# Patient Record
Sex: Male | Born: 1935 | Race: White | Hispanic: No | Marital: Married | Smoking: Former smoker
Health system: Southern US, Community
[De-identification: ages and names within clinical notes are randomized; demographics above are authoritative.]

## PROBLEM LIST (undated history)

## (undated) DIAGNOSIS — M109 Gout, unspecified: Secondary | ICD-10-CM

## (undated) DIAGNOSIS — J40 Bronchitis, not specified as acute or chronic: Secondary | ICD-10-CM

## (undated) DIAGNOSIS — N189 Chronic kidney disease, unspecified: Secondary | ICD-10-CM

## (undated) DIAGNOSIS — I4891 Unspecified atrial fibrillation: Secondary | ICD-10-CM

## (undated) DIAGNOSIS — I714 Abdominal aortic aneurysm, without rupture: Secondary | ICD-10-CM

## (undated) DIAGNOSIS — M545 Low back pain: Secondary | ICD-10-CM

## (undated) DIAGNOSIS — M1990 Unspecified osteoarthritis, unspecified site: Secondary | ICD-10-CM

## (undated) DIAGNOSIS — K219 Gastro-esophageal reflux disease without esophagitis: Secondary | ICD-10-CM

## (undated) DIAGNOSIS — K259 Gastric ulcer, unspecified as acute or chronic, without hemorrhage or perforation: Secondary | ICD-10-CM

## (undated) DIAGNOSIS — I779 Disorder of arteries and arterioles, unspecified: Secondary | ICD-10-CM

## (undated) DIAGNOSIS — N400 Enlarged prostate without lower urinary tract symptoms: Secondary | ICD-10-CM

## (undated) DIAGNOSIS — M47816 Spondylosis without myelopathy or radiculopathy, lumbar region: Secondary | ICD-10-CM

## (undated) DIAGNOSIS — I10 Essential (primary) hypertension: Secondary | ICD-10-CM

## (undated) DIAGNOSIS — Z87442 Personal history of urinary calculi: Secondary | ICD-10-CM

## (undated) DIAGNOSIS — K449 Diaphragmatic hernia without obstruction or gangrene: Secondary | ICD-10-CM

## (undated) DIAGNOSIS — I739 Peripheral vascular disease, unspecified: Secondary | ICD-10-CM

## (undated) DIAGNOSIS — L309 Dermatitis, unspecified: Secondary | ICD-10-CM

## (undated) DIAGNOSIS — C801 Malignant (primary) neoplasm, unspecified: Secondary | ICD-10-CM

## (undated) DIAGNOSIS — M48061 Spinal stenosis, lumbar region without neurogenic claudication: Secondary | ICD-10-CM

## (undated) DIAGNOSIS — G8929 Other chronic pain: Secondary | ICD-10-CM

## (undated) HISTORY — PX: CATARACT EXTRACTION, BILATERAL: SHX1313

## (undated) HISTORY — DX: Diaphragmatic hernia without obstruction or gangrene: K44.9

## (undated) HISTORY — PX: EYE SURGERY: SHX253

## (undated) HISTORY — PX: SPINE SURGERY: SHX786

## (undated) HISTORY — DX: Chronic kidney disease, unspecified: N18.9

## (undated) HISTORY — PX: PROSTATE SURGERY: SHX751

## (undated) HISTORY — DX: Abdominal aortic aneurysm, without rupture: I71.4

## (undated) HISTORY — PX: TONSILLECTOMY: SUR1361

## (undated) HISTORY — DX: Essential (primary) hypertension: I10

---
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
## 1993-02-09 DIAGNOSIS — M545 Low back pain, unspecified: Secondary | ICD-10-CM

## 1993-02-09 DIAGNOSIS — G8929 Other chronic pain: Secondary | ICD-10-CM

## 1993-02-09 HISTORY — DX: Other chronic pain: G89.29

## 1993-02-09 HISTORY — DX: Low back pain, unspecified: M54.50

## 1997-07-21 ENCOUNTER — Ambulatory Visit (HOSPITAL_COMMUNITY): Admission: RE | Admit: 1997-07-21 | Discharge: 1997-07-21 | Payer: Self-pay | Admitting: *Deleted

## 1998-11-19 ENCOUNTER — Ambulatory Visit (HOSPITAL_COMMUNITY): Admission: RE | Admit: 1998-11-19 | Discharge: 1998-11-19 | Payer: Self-pay | Admitting: *Deleted

## 1998-12-06 ENCOUNTER — Encounter: Payer: Self-pay | Admitting: Neurosurgery

## 1998-12-10 ENCOUNTER — Encounter: Payer: Self-pay | Admitting: Neurosurgery

## 1998-12-10 ENCOUNTER — Inpatient Hospital Stay (HOSPITAL_COMMUNITY): Admission: RE | Admit: 1998-12-10 | Discharge: 1998-12-14 | Payer: Self-pay | Admitting: Neurosurgery

## 1998-12-23 ENCOUNTER — Encounter: Admission: RE | Admit: 1998-12-23 | Discharge: 1998-12-23 | Payer: Self-pay | Admitting: Neurosurgery

## 1998-12-23 ENCOUNTER — Encounter: Payer: Self-pay | Admitting: Neurosurgery

## 1999-01-24 ENCOUNTER — Encounter: Admission: RE | Admit: 1999-01-24 | Discharge: 1999-01-24 | Payer: Self-pay | Admitting: Neurosurgery

## 1999-01-24 ENCOUNTER — Encounter: Payer: Self-pay | Admitting: Neurosurgery

## 1999-03-03 ENCOUNTER — Encounter: Admission: RE | Admit: 1999-03-03 | Discharge: 1999-03-03 | Payer: Self-pay | Admitting: Neurosurgery

## 1999-03-03 ENCOUNTER — Encounter: Payer: Self-pay | Admitting: Neurosurgery

## 1999-07-21 ENCOUNTER — Encounter: Admission: RE | Admit: 1999-07-21 | Discharge: 1999-07-21 | Payer: Self-pay | Admitting: Neurosurgery

## 1999-07-21 ENCOUNTER — Encounter: Payer: Self-pay | Admitting: Neurosurgery

## 1999-12-20 ENCOUNTER — Ambulatory Visit (HOSPITAL_COMMUNITY): Admission: RE | Admit: 1999-12-20 | Discharge: 1999-12-20 | Payer: Self-pay | Admitting: Neurosurgery

## 1999-12-20 ENCOUNTER — Encounter: Payer: Self-pay | Admitting: Neurosurgery

## 1999-12-29 ENCOUNTER — Encounter: Payer: Self-pay | Admitting: Neurosurgery

## 1999-12-29 ENCOUNTER — Ambulatory Visit (HOSPITAL_COMMUNITY): Admission: RE | Admit: 1999-12-29 | Discharge: 1999-12-29 | Payer: Self-pay | Admitting: Neurosurgery

## 2000-01-02 ENCOUNTER — Emergency Department (HOSPITAL_COMMUNITY): Admission: EM | Admit: 2000-01-02 | Discharge: 2000-01-02 | Payer: Self-pay

## 2000-01-02 ENCOUNTER — Encounter: Payer: Self-pay | Admitting: Emergency Medicine

## 2000-01-07 ENCOUNTER — Encounter: Payer: Self-pay | Admitting: Urology

## 2000-01-08 ENCOUNTER — Encounter: Payer: Self-pay | Admitting: Urology

## 2000-01-08 ENCOUNTER — Ambulatory Visit (HOSPITAL_COMMUNITY): Admission: RE | Admit: 2000-01-08 | Discharge: 2000-01-08 | Payer: Self-pay | Admitting: Urology

## 2000-02-24 ENCOUNTER — Encounter: Payer: Self-pay | Admitting: Neurosurgery

## 2000-02-24 ENCOUNTER — Inpatient Hospital Stay (HOSPITAL_COMMUNITY): Admission: RE | Admit: 2000-02-24 | Discharge: 2000-02-26 | Payer: Self-pay | Admitting: Neurosurgery

## 2000-06-10 ENCOUNTER — Encounter: Payer: Self-pay | Admitting: Neurosurgery

## 2000-06-10 ENCOUNTER — Ambulatory Visit (HOSPITAL_COMMUNITY): Admission: RE | Admit: 2000-06-10 | Discharge: 2000-06-10 | Payer: Self-pay | Admitting: Neurosurgery

## 2000-06-17 ENCOUNTER — Encounter: Payer: Self-pay | Admitting: Neurosurgery

## 2000-06-17 ENCOUNTER — Inpatient Hospital Stay (HOSPITAL_COMMUNITY): Admission: RE | Admit: 2000-06-17 | Discharge: 2000-06-19 | Payer: Self-pay | Admitting: Neurosurgery

## 2000-08-29 ENCOUNTER — Ambulatory Visit (HOSPITAL_COMMUNITY): Admission: RE | Admit: 2000-08-29 | Discharge: 2000-08-29 | Payer: Self-pay | Admitting: Neurosurgery

## 2002-01-23 ENCOUNTER — Encounter: Payer: Self-pay | Admitting: Neurosurgery

## 2002-01-23 ENCOUNTER — Ambulatory Visit (HOSPITAL_COMMUNITY): Admission: RE | Admit: 2002-01-23 | Discharge: 2002-01-23 | Payer: Self-pay | Admitting: Neurosurgery

## 2002-03-03 ENCOUNTER — Inpatient Hospital Stay (HOSPITAL_COMMUNITY): Admission: RE | Admit: 2002-03-03 | Discharge: 2002-03-08 | Payer: Self-pay | Admitting: Neurosurgery

## 2002-03-03 ENCOUNTER — Encounter: Payer: Self-pay | Admitting: Neurosurgery

## 2003-04-27 ENCOUNTER — Encounter: Admission: RE | Admit: 2003-04-27 | Discharge: 2003-04-27 | Payer: Self-pay | Admitting: Internal Medicine

## 2004-05-12 ENCOUNTER — Encounter: Admission: RE | Admit: 2004-05-12 | Discharge: 2004-05-12 | Payer: Self-pay | Admitting: Internal Medicine

## 2006-06-01 ENCOUNTER — Encounter: Admission: RE | Admit: 2006-06-01 | Discharge: 2006-06-01 | Payer: Self-pay | Admitting: Internal Medicine

## 2007-01-27 ENCOUNTER — Ambulatory Visit: Payer: Self-pay | Admitting: *Deleted

## 2008-01-19 ENCOUNTER — Ambulatory Visit: Payer: Self-pay | Admitting: *Deleted

## 2009-02-04 ENCOUNTER — Ambulatory Visit: Payer: Self-pay | Admitting: Surgery

## 2010-02-17 ENCOUNTER — Ambulatory Visit
Admission: RE | Admit: 2010-02-17 | Discharge: 2010-02-17 | Payer: Self-pay | Source: Home / Self Care | Attending: Surgery | Admitting: Surgery

## 2010-02-17 ENCOUNTER — Ambulatory Visit: Admit: 2010-02-17 | Payer: Self-pay | Admitting: Surgery

## 2010-06-24 NOTE — Procedures (Signed)
DUPLEX ULTRASOUND OF ABDOMINAL AORTA   INDICATION:  Followup abdominal aortic aneurysm.   HISTORY:  Diabetes:  No.  Cardiac:  No.  Hypertension:  Yes.  Smoking:  No.  Connective Tissue Disorder:  Family History:  No.  Previous Surgery:  No.   DUPLEX EXAM:         AP (cm)                   TRANSVERSE (cm)  Proximal             2.9 cm                    2.8 cm  Mid                  2.3 cm                    2.1 cm  Distal               3.1 cm                    3.2 cm  Right Iliac          Not visualized            Not visualized  Left Iliac           Not visualized            Not visualized   PREVIOUS:  Date:  01/27/2007  AP:  3.0  TRANSVERSE:  3.2   IMPRESSION:  1. Aneurysm of the distal abdominal aorta with no significant change      in the maximum diameter noted when compared to the previous exam.  2. Unable to adequately visualize the bilateral common iliac arteries      due to overlying bowel gas patterns.   ___________________________________________  P. Drucie Opitz, M.D.   CH/MEDQ  D:  01/19/2008  T:  01/19/2008  Job:  [id number]

## 2010-06-24 NOTE — Procedures (Signed)
DUPLEX ULTRASOUND OF ABDOMINAL AORTA   INDICATION:  Followup evaluation of known abdominal aortic aneurysm.   HISTORY:  Diabetes:  No.  Cardiac:  No.  Hypertension:  Yes.  Smoking:  No.  Connective Tissue Disorder:  Family History:  No.  Previous Surgery:  No.   DUPLEX EXAM:         AP (cm)                   TRANSVERSE (cm)  Proximal             2.0 cm                    1.7 cm  Mid                  3.0 cm                    3.2 cm  Distal               2.0 cm                    1.9 cm  Right Iliac          0.9 cm                    1.3 cm  Left Iliac           0.8 cm                    1.0 cm   PREVIOUS:  Date: 01/21/2006  AP:  2.9  TRANSVERSE:  3.1   IMPRESSION:  No significant change in abdominal aortic aneurysm  dimensions since previous study performed on 01/21/2006.   ___________________________________________  P. Drucie Opitz, M.D.   MC/MEDQ  D:  01/27/2007  T:  01/28/2007  Job:  [id number]

## 2010-06-24 NOTE — Procedures (Signed)
DUPLEX ULTRASOUND OF ABDOMINAL AORTA   INDICATION:  Follow up abdominal aortic aneurysm.   HISTORY:  Diabetes:  No  Cardiac:  No  Hypertension:  Yes  Smoking:  No  Family History:  No  Previous Surgery:  No   DUPLEX EXAM:         AP (cm)                   TRANSVERSE (cm)  Proximal             1.87 cm                   1.96 cm  Mid                  2.33 cm                   2.36 cm  Distal               3.04 cm                   3.27 cm  Right Iliac          1.02 cm  Left Iliac           1.05 cm   PREVIOUS:  Date: 01/19/2008  AP:  3.1  TRANSVERSE:  3.2   IMPRESSION:  Stable abdominal aortic aneurysm with largest measurement  of 3.04 cm x 3.27 cm.   ___________________________________________  V. Leia Alf, MD   AS/MEDQ  D:  02/04/2009  T:  02/05/2009  Job:  [id number]

## 2010-06-24 NOTE — Procedures (Signed)
DUPLEX ULTRASOUND OF ABDOMINAL AORTA   INDICATION:  Followup abdominal aortic aneurysm.   HISTORY:  Diabetes:  No.  Cardiac:  No.  Hypertension:  Yes.  Smoking:  No.  Connective Tissue Disorder:  Family History:  No.  Previous Surgery:   DUPLEX EXAM:         AP (cm)                   TRANSVERSE (cm)  Proximal             1.9 cm                    2.3 cm  Mid                  3.2 cm                    3.0 cm  Distal               1.8 cm                    2.3 cm  Right Iliac          1.3 cm                    1.6 cm  Left Iliac           1.2 cm                    1.6 cm   PREVIOUS:  Date:  AP:  3.0  TRANSVERSE:  3.3   IMPRESSION:  Stable appearing abdominal aortic aneurysm measuring 3.2 x  3.0 cm with intraluminal thrombus.   ___________________________________________  V. Leia Alf, MD   OD/MEDQ  D:  02/17/2010  T:  02/17/2010  Job:  [id number]

## 2010-06-24 NOTE — Assessment & Plan Note (Signed)
OFFICE VISIT   Justin [name], Justin [name]  DOB:  1923-08-27                                       02/17/2010  [id number]   The patient is a very pleasant 77 year old gentleman who has previously  seen Dr. Amedeo Plenty in the past for a small abdominal aortic aneurysm.  He is  getting regular yearly ultrasounds.  He comes in today for follow-up.  He is not having any complaints at this time and specifically no  abdominal pain.   PHYSICAL EXAMINATION:  Heart rate is 78, blood pressure 155/80, O2  saturation is 96%.  Generally, he is well-appearing, in no distress.  Cardiovascular:  Regular rate and rhythm.  No carotid bruits.  Abdomen:  Soft, nontender.  He has palpable femoral pulses.  Extremities are warm  and well-perfused.   DIAGNOSTIC STUDIES:  Ultrasound was performed today, which shows a  stable aneurysm sac measuring 3.2 x 3.0 cm.   ASSESSMENT:  Small abdominal aortic aneurysm.   PLAN:  We will continue with yearly surveillance of his aneurysm.  He  will follow up in our [facility] clinic in 1 year.     Eldridge Abrahams, MD  Electronically Signed   VWB/MEDQ  D:  02/17/2010  T:  02/18/2010  Job:  [id number]   cc:   Burnard Bunting, M.D.
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
## 2010-06-27 NOTE — H&P (Signed)
[place]. [facility]  Patient:    [name], [name]                       MRN: [id number] Adm. Date:  [id number] Attending:  Alex Gardener                         History and Physical  REASON FOR ADMISSION:  Herniated lumbar disk, L5-S1 left, with left S1 radiculopathy.  HISTORY OF PRESENT ILLNESS:  Justin [name] is a 77 year old man who is well known to me and on whom I have done two prior lumbar surgeries.  He underwent an L4-5 Ray cage fusion in October 2000 for lumbar spondylosis, spondylolithesis, spinal stenosis, and degenerative disk disease.  He did well following that surgery but subsequently developed bilateral lower extremity pain and numbness with neurogenic claudication and was found to have spinal stenosis at the L2-3 and L3-4 levels.  He underwent decompressive lumbar laminectomy in January 2002 and did well following that surgery; however, the patient then returned to my office in April complaining of more left leg pain recently, ever since he coughed and developed left leg pain.  The patient was felt to have an examination consistent with an S1 radiculopathy on the left at that point.  He noted pain in his low back and buttock, which goes down to the back of his left leg, into his calf, and into the sole of his foot.  He says that the sole of his goes numb.  He has full strength on confrontational testing but had a positive seated straight leg raise and reflex testing and symmetric knee jerks at 2+.  Ankle jerk was 2+ in the right and absent ankle jerk on the left.  It was my initial impression that the patient had an S1 radiculopathy.  He underwent an MRI of his lumbar spine and this demonstrated a small focal disk herniation at the L5-S1 level on the left compressing the S1 nerve root.  He was found to have significant spondylosis and lateral recessed stenosis at L5-S1, more marked on the left, which appears to be contributing to left S1  nerve root compression.  He says that he was miserable with pain and was very frustrated about his lack of improvement.  The area of previously surgery did not demonstrate significant left-sided structural pathology.  I gave Mr. Gillin the opportunity of pursuing epidural steroid injections or selective nerve root block or going ahead with surgery, which would consist of a hemisemilaminectomy and microdiskectomy at L5-S1 on the left.  He said he wished to go ahead with surgery as soon as possible, and it was therefore elected to proceed with surgical intervention.  This is set up for Jun 17, 2000.  I reviewed the patients studies and went over his physical examination.  I reviewed surgical models and discussed typical hospital course and operative and postoperative course and potential risks and benefits of the surgery.  The risks of surgery were discussed in detail with the patient and include but are not limited to the risks of anesthesia, blood loss, the possibility of hemorrhage, infection, damage to nerves, damage to blood vessels, injury to lumbar nerve root causing either temporary or permanent leg pain, numbness or weakness.  There is the potential for spinal fluid leak from dural tear. There is potential for post-laminectomy spondylolithesis, recurrent disk herniation quoted approximately 10%, failure to relieve pain, worsening of pain, need  for further surgery.  PAST MEDICAL HISTORY:  Hypertension, for which he takes Prinzide 12.5 mg daily, and he has a known abdominal aortic aneurysm which measures 3 cm and is followed by Dr. Amedeo Plenty.  ALLERGIES: 1. DEMEROL. 2. CODEINE.  CURRENT MEDICATIONS: 1. Prinzide daily. 2. Baby aspirin daily.  PHYSICAL EXAMINATION:  The remainder of his physical examination was unremarkable and his health is otherwise good. DD:  06/17/00 TD:  06/17/00 Job: [id number]

## 2010-06-27 NOTE — Op Note (Signed)
[place]. [facility]  Patient:    Justin [name], Justin [name]                       MRN: [id number] Proc. Date: 06/17/00 Adm. Date:  [id number] Attending:  Alex Gardener                           Operative Report  PREOPERATIVE DIAGNOSIS:  Herniated lumbar disk with foraminal and lateral recess stenosis at the left L5-S1 level, with degenerative disk disease and spondylosis and lumbar radiculopathy.  POSTOPERATIVE DIAGNOSIS:  Herniated lumbar disk with foraminal and lateral recess stenosis at the left L5-S1 level, with degenerative disk disease and spondylosis and lumbar radiculopathy.  PROCEDURE:  Left L5-S1 microdiskectomy with microdissection.  SURGEON:  Marchia Meiers. Vertell Limber, M.D.  ASSISTANT:  Ophelia Charter, M.D.  ANESTHESIA:  General endotracheal anesthesia.  ESTIMATED BLOOD LOSS:  Minimal.  COMPLICATIONS:  None.  DISPOSITION:  To recovery.  INDICATIONS:  Justin [name] is a 77 year old man with left S1 radiculopathy. He was found to have focal disk herniation along with foraminal stenosis causing left S1 nerve root compression.  It was elected to take him to surgery for decompression and foraminotomy and microdiskectomy.  DESCRIPTION OF PROCEDURE:  Justin [name] was brought to the operating room. Following the satisfactory and uncomplicated induction of general endotracheal anesthesia and placement of intravenous lines, the patient was placed in a prone position on the operating table on the Wilson frame.  Care was taken to pad soft tissue and bony prominences.  His low back was shaved, then prepped and draped in the usual sterile fashion.  The area of planned incision was infiltrated with 0.25% Marcaine and 0.5% lidocaine with 1:100,000 epinephrine. Incision was made overlying the L5-S1 interspace, carried through subcutaneous tissues to the lumbodorsal fascia, which was incised on the left side of midline.  Subperiosteal dissection was then performed,  exposing the L5-S1 interspace on the left.  Self-retaining retractor was placed.  An intraoperative x-ray confirmed correct level.  The [facility] Max drill with AM8 equivalent bur was then used to perform a hemisemilaminectomy of L5.  This was completed with Kerrison rongeur.  A foraminotomy was performed overlying the S1 nerve root as well.  There was significantly hypertrophied ligamentum flavum, which was removed in a piecemeal fashion.  There was significant hypertrophy of the medial aspect of the facet joint, which was decompressed and removed with the high-speed drill and Kerrison rongeurs.  The S1 nerve root was significantly decompressed with these maneuvers, and it was also decompressed with a foraminotomy overlying the S1 nerve root as it extended out the neural foramen.  The spinal floor was palpated with a Animator.  This was done under microscopic visualization using microdissection technique.  The nerve root was mobilized and retracted medially using a DErrico nerve root retractor.  There was a focal disk herniation, which was subligamentous, directly underlying the S1 nerve root. The epidural veins were cauterized with bipolar electrocautery.  The annulus was then incised with a 15 blade and disk material was removed in a piecemeal fashion.  The end plates were cleared of residual disk material, and medial disk material was also removed as well as disk material in the foraminal region.  Redundant annulus was cauterized with bipolar electrocautery.  There did not appear to be any residual loose disk material.  The wound was then copiously irrigated with bacitracin and saline.  Hemostasis was assured with Gelfoam soaked in thrombin.  The course of the S1 nerve root and the medial sac appeared to be well-decompressed at this point.  The microscope was taken out of the field.  The self-retaining retractor was removed.  The lumbodorsal fascia was closed with 0 Vicryl  suture, subcutaneous tissues were approximated with 2-0 Vicryl, and 3-0 Vicryl interrupted buried stitches in the skin edges. The wound was dressed with benzoin and Steri-Strips, Telfa gauze, and tape. The patient was extubated in the operating room, taken to the recovery room in stable and satisfactory condition, having tolerated this operation well. Counts were correct at the end of the case. DD:  06/17/00 TD:  06/17/00 Job: [id number]

## 2010-06-27 NOTE — Op Note (Signed)
[name], Justin                          ACCOUNT NO.:  [id number]   MEDICAL RECORD NO.:  [id number]                   PATIENT TYPE:  INP   LOCATION:  3021                                 FACILITY:  [facility]   PHYSICIAN:  Marchia Meiers. Vertell Limber, M.D.               DATE OF BIRTH:  1923-07-17   DATE OF PROCEDURE:  DATE OF DISCHARGE:                                 OPERATIVE REPORT   PREOPERATIVE DIAGNOSIS:  Recurrent herniated lumbar disk, L5-S1 with  spondylosis, degenerative disk disease and lumbar radiculopathy.   POSTOPERATIVE DIAGNOSIS:  Recurrent herniated lumbar disk, L5-S1 with  spondylosis, degenerative disk disease and lumbar radiculopathy.   PROCEDURE:  1. Re-do diskectomy, L5-S1, left.  2. Transverse lumbar interbody fusion, L5-S1.  3. Exploration of L4-5.  4. Pedicle screw fixation, L5 through S1 bilaterally.  5. Posterolateral arthrodesis, L4 through S1 laterally using morselized bone     autograft and VITOSS .   SURGEON:  Dr. Vertell Limber.   ASSISTANT:  Dr. Carloyn Manner.   ANESTHESIA:  General endotracheal anesthesia.   ESTIMATED BLOOD LOSS:  250 cc.   COMPLICATIONS:  None.   DISPOSITION:  Recovery.   INDICATIONS FOR PROCEDURE:  Justin [name] is a 77 year old man who has  undergone multiple lumbar surgeries in the past.  He underwent Ray cage at  the L4-5 level from which he did well.  He subsequently developed a  herniated disk at L5-S1 on the left and he underwent microdiskectomy, but  now has developed a foraminal recurrence of L5-S1 disk herniation on the  left.  It is elected to take him to surgery for decompression and fusion.   DESCRIPTION OF PROCEDURE:  Justin [name] is brought to the operating room.  Following satisfactory and uncomplicated induction of general endotracheal  anesthesia, and placement of intravenous lines, he was placed in the prone  position on the chest rolls, his low back was prepped and draped in the  usual sterile fashion.  The are of planned incision  was infiltrated with  0.25% Marcaine, 0.5% lidocaine with 1:200,000 epinephrine.  Incision was  made in the midline, carried through to the lumbodorsal fascia through  previously scarred in operative field.  Subperiosteal dissection was  performed to the transverse processes of L4 through the sacral ala.  Self-  retaining retractor was placed to facilitate exposure.  Intraoperative x-ray  confirmed marked at the vicinity of the L4-L5 transverse processes.  Subsequently, the fusion at L4-5 was explored and appeared to be a solid  arthrodesis.  The previously scarred in area at the left at L5-S1 was then  taken down and the pars intra-articularis was drilled out as was the lamina  and the inferior facet of L5 was removed on both the right and left sides.  The L5 nerve root was carefully dissected free of bone and soft tissue as it  extended on the left at the neuroforamen and the  lateral recess was the  decompressed under loop magnification on the left at the L5-S1 level.  There  was a large disk herniation which contained ligament which was causing  compression of both the S1 nerve root as well as the L5 nerve root as it  exited the foramen.  Using the nerve root retractor to facilitate exposure,  the L5-S1 interspace was incised with 15 blade and disk material was removed  in piecemeal fashion.  Subsequently, a laminar spreader was placed on the  right with good opening of the interspace and subsequently the Synthes 2 set  was used to do a further, more complete diskectomy at this level.  This  resulted in significant decompression of the thecal sac and the L5 and S1  nerve roots.  The end plates were stripped of any residual cartilaginous  material.  Using the intraoperative fluoroscopy to confirm positioning of  the bone grafts and pedicle screw fixation, a 9 mm Synthes allograft T-lift  spacer was placed, and accomplished appropriately.  Morselized bone  autograft was then placed over  this and counter sunk appropriately.  Pedicle  screws were then placed at L5 and S1, all screws were 5.75 mm x 40 mm, ST 90  screw system was used.  All screws had excellent purchase, positioning was  confirmed on AP and lateral fluoroscopy, there were no cut outs of the  pedicles.  Subsequently, the posterolateral bone graft was placed along with  VITOSS from the L4 transverse process, including the L5 transverse process  and the sacral ala bilaterally.  40 mm rods were then placed over the  pedicle screws, there were locked into position in situ, the nerve roots  were well decompressed.  Soft tissues were inspected and found to be in good  repair.  Prior to placing the bone graft, the wound was extensively  irrigated with Bacitracin saline.  Subsequently, the self retaining  retractor was removed, lumbodorsal fascia was closed with 1-0 Vicryl  sutures, subcutaneous tissues reapproximated with 2-0 Vicryl interrupted  inverted sutures, skin edges were reapproximated with interrupted 3-0 Vicryl  subcuticular stitch.  The wound was dressed with Benzoin, Steri-Strips,  Telfa gauze, and tape.  The patient was extubated in the operating room and  returned to the recovery room in stable, satisfactory condition, having  tolerated his operation well.                                               Marchia Meiers. Vertell Limber, M.D.    JDS/MEDQ  D:  03/03/2002  T:  03/04/2002  Job:  [id number]

## 2010-06-27 NOTE — Op Note (Signed)
Justin [name]. Justin [name]  Patient:    Justin [name], Justin [name]                       MRN: [id number] Proc. Date: 02/24/00 Adm. Date:  [id number] Attending:  Alex [name]                           Operative Report  PREOPERATIVE DIAGNOSIS:  Lumbar spinal stenosis, spondylosis and degenerative disc disease and radiculopathy L2-3 and L3-4 levels.  POSTOPERATIVE DIAGNOSIS:  Lumbar spinal stenosis, spondylosis and degenerative disc disease and radiculopathy L2-3 and L3-4 levels.  OPERATION:  Decompressive lumbar laminectomy L2, L3 and L4.  SURGEON:  Justin [name]. Justin [name], M.D.  ASSISTANT:  Justin [name]. Justin [name], M.D.  ANESTHESIA:  General endotracheal.  ESTIMATED BLOOD LOSS:  50 cc  COMPLICATIONS:  None.  DISPOSITION:  Recovery  INDICATIONS:  The patient is a 77 year old man who had previously undergone lumbar decompression and fusion at the L4-5 level with Ray cages.  He subsequently developed two years later spinal stenosis at the L2-3 and more severely at the L3-4 levels.  It was elected to take him to surgery for a decompressive lumbar laminectomy.  DESCRIPTION OF PROCEDURE:  Justin [name] was brought to the operating room. Following the satisfactory and uncomplicated induction of general endotracheal anesthesia and placement of intravenous lines, he was placed in a prone position on the Wilson frame.  His back was maintained in neutral alignment. His low back was shaved and then prepped and draped in the usual sterile fashion.  Area of planned incision was infiltrated with 0.25% Marcaine, 0.25% Lidocaine, 1:200,000 epinephrine.  Incision was made overlying the L2, L3 and L4 spinous processes, carried through lumbodorsal fascia with electrocautery. Subperiosteal dissection was performed exposing the L2-3 and L3-4 interspaces. Self retaining retractors were placed.  Towel clips were placed under the L2, L3 and L4 spinous processes and positioning of the towel clips were  confirmed on intraoperative x-ray.  The total removal of the spinous process of L3 was performed.  The cephalad half of the L4 spinous processes were removed and the inferior half of the L2 spinous process was removed.  Using the drill and AM3 bur equivalent, the laminae of L2, L3 and L4 were then thinned.  Using 3 and 4 mm Kerrison rongeurs the central spinal canal dura was decompressed.  There was marked ligamentous hypertrophy at the L3-4 interspace and to a lesser degree at the L2-3 interspace.  After decompressing the central canal, the laterally based ligament was then removed.  The decompression was carried along at the L4 level down to where there appeared to be investing scar tissue and the dura was tethered to the remaining bone.  Lateral recesses were decompressed along the L4, L3 and L2 nerve roots.  Hemostasis was assured with Gelfoam soaked in thrombin and bone wax.  The wound was copiously irrigated with Bacitracin saline.  Decompression was performed under loupe magnification.  The self retaining retractors were removed.  Deep muscle layers were reapproximated with 0 Vicryl sutures.  The lumbodorsal fascia was reapproximated with 0 Vicryl sutures.  Skin edges were reapproximated with interrupted 2-0 and 3-0 Vicryl subcuticular stitch.  The wound was dressed with Benzoin, Steri-Strips, Telfa gauze and tape.  The patient was extubated in the operating room and taken to the recovery room in stable and satisfactory condition having tolerated the operation well.  Counts were correct at the  end of the case. DD:  02/24/00 TD:  02/24/00 Job: [id number]
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
## 2011-01-19 ENCOUNTER — Encounter: Payer: Self-pay | Admitting: Surgery

## 2011-02-18 DIAGNOSIS — R7301 Impaired fasting glucose: Secondary | ICD-10-CM | POA: Diagnosis not present

## 2011-02-18 DIAGNOSIS — I739 Peripheral vascular disease, unspecified: Secondary | ICD-10-CM | POA: Diagnosis not present

## 2011-02-18 DIAGNOSIS — Z23 Encounter for immunization: Secondary | ICD-10-CM | POA: Diagnosis not present

## 2011-02-18 DIAGNOSIS — I10 Essential (primary) hypertension: Secondary | ICD-10-CM | POA: Diagnosis not present

## 2011-02-20 ENCOUNTER — Encounter: Payer: Self-pay | Admitting: Surgery

## 2011-02-23 ENCOUNTER — Ambulatory Visit (INDEPENDENT_AMBULATORY_CARE_PROVIDER_SITE_OTHER): Payer: Medicare Other | Admitting: Surgery

## 2011-02-23 ENCOUNTER — Encounter (INDEPENDENT_AMBULATORY_CARE_PROVIDER_SITE_OTHER): Payer: Medicare Other | Admitting: *Deleted

## 2011-02-23 ENCOUNTER — Encounter: Payer: Self-pay | Admitting: Surgery

## 2011-02-23 VITALS — BP 158/77 | HR 69 | Resp 16 | Ht 69.0 in | Wt 170.0 lb

## 2011-02-23 DIAGNOSIS — I714 Abdominal aortic aneurysm, without rupture, unspecified: Secondary | ICD-10-CM | POA: Insufficient documentation

## 2011-02-23 NOTE — Progress Notes (Signed)
[facility]   Patient name: Justin [name] MRN: [id number] DOB: 21-May-1923 Sex: male     Chief Complaint  Patient presents with  . AAA    one year f/up w/ Labs    HISTORY OF PRESENT ILLNESS: The patient is here for followup of his abdominal aneurysm. He denies any complaints of abdominal pain. He has had no major issues since I last saw him. He denies any neurologic symptoms he denies claudication.  Past Medical History  Diagnosis Date  . Hypertension   . Hiatal hernia   . AAA (abdominal aortic aneurysm)     History reviewed. No pertinent past surgical history.  History   Social History  . Marital Status: Married    Spouse Name: N/A    Number of Children: N/A  . Years of Education: N/A   Occupational History  . Not on file.   Social History Main Topics  . Smoking status: Former Smoker    Types: Cigarettes    Quit date: 02/10/1980  . Smokeless tobacco: Not on file  . Alcohol Use: No  . Drug Use:   . Sexually Active:    Other Topics Concern  . Not on file   Social History Narrative  . No narrative on file    Family History  Problem Relation Age of Onset  . Stroke Mother   . Heart disease Father   . Stroke Sister   . Cancer Brother     stomach    Allergies as of 02/23/2011 - Review Complete 02/23/2011  Allergen Reaction Noted  . Demerol  01/19/2011    Current Outpatient Prescriptions on File Prior to Visit  Medication Sig Dispense Refill  . aspirin EC 81 MG tablet Take 81 mg by mouth daily.        . dorzolamide-timolol (COSOPT) 22.3-6.8 MG/ML ophthalmic solution Place 1 drop into both eyes 2 (two) times daily.        Marland Kitchen LISINOPRIL PO Take by mouth daily.           REVIEW OF SYSTEMS: Cardiovascular: No chest pain, chest pressure, palpitations, orthopnea, or dyspnea on exertion. No claudication or rest pain,  No history of DVT or phlebitis. Pulmonary: No productive cough, asthma or wheezing. Neurologic: No weakness,  paresthesias, aphasia, or amaurosis. No dizziness. Hematologic: No bleeding problems or clotting disorders. Musculoskeletal: No joint pain or joint swelling. Gastrointestinal: No blood in stool or hematemesis Genitourinary: No dysuria or hematuria. Psychiatric:: No history of major depression. Integumentary: No rashes or ulcers. Constitutional: No fever or chills.  PHYSICAL EXAMINATION:   Vital signs are BP 158/77  Pulse 69  Resp 16  Ht 5\' 9"  (1.753 m)  Wt 170 lb (77.111 kg)  BMI 25.10 kg/m2  SpO2 97% General: The patient appears their stated age. HEENT:  No gross abnormalities Pulmonary:  Non labored breathing Abdomen: Soft and non-tender Musculoskeletal: There are no major deformities. Neurologic: No focal weakness or paresthesias are detected, Skin: There are no ulcer or rashes noted. Psychiatric: The patient has normal affect. Cardiovascular: There is a regular rate and rhythm without significant murmur appreciated. No carotid bruits   Diagnostic Studies Ultrasound has been reviewed today his aneurysm measures 3.8 cm this is an increase from last year where it was 3.2.  Assessment: Abdominal aortic aneurysm Plan: While the patient has had a rather significant increase in size his aneurysm over the past year (6 mm) I do not believe he needs criteria to have this addressed.  I am having him come back in one year for repeat ultrasound certainly if he is continuing to grow at this rate he may require intervention sooner rather than later. Again he will come back in one year with a repeat ultrasound.  Eldridge Abrahams, M.D. [facility] [place] Office: [phone] Pager:  [phone]

## 2011-02-25 ENCOUNTER — Other Ambulatory Visit: Payer: Self-pay | Admitting: Neurosurgery

## 2011-02-25 DIAGNOSIS — IMO0002 Reserved for concepts with insufficient information to code with codable children: Secondary | ICD-10-CM | POA: Diagnosis not present

## 2011-02-25 DIAGNOSIS — M545 Low back pain: Secondary | ICD-10-CM | POA: Diagnosis not present

## 2011-02-25 DIAGNOSIS — M47817 Spondylosis without myelopathy or radiculopathy, lumbosacral region: Secondary | ICD-10-CM

## 2011-03-02 NOTE — Procedures (Unsigned)
DUPLEX ULTRASOUND OF ABDOMINAL AORTA  INDICATION:  Followup AAA  HISTORY: Diabetes:  No Cardiac:  No Hypertension:  Yes Smoking:  No Connective Tissue Disorder: Family History:  No Previous Surgery:  No  DUPLEX EXAM:         AP (cm)                   TRANSVERSE (cm) Proximal             2.38 cm                   cm Mid                  3.82 cm                   3.86 cm Distal               2.06 cm                   cm Right Iliac          1.08 cm                   cm Left Iliac           1.01 cm                   cm  PREVIOUS:  Date:  02/17/2010  AP:  3.2  TRANSVERSE:  3.0  IMPRESSION: 1. Evidence of abdominal aortic aneurysm measuring 3.82 x 3.86 cm on     today's exam which represents an increase in size compared to     previous exam. 2. There is mild to moderate diffuse plaque of the proximal aorta with     nonhemodynamically significant calcific disease at the neck of the     aneurysm which may have prevented accurate measurements.  ___________________________________________ V. Leia Alf, MD  LT/MEDQ  D:  02/24/2011  T:  02/24/2011  Job:  [id number]

## 2011-03-04 ENCOUNTER — Ambulatory Visit
Admission: RE | Admit: 2011-03-04 | Discharge: 2011-03-04 | Disposition: A | Discharge status: Home / Self Care | Payer: Medicare Other | Source: Ambulatory Visit | Attending: Neurosurgery | Admitting: Neurosurgery

## 2011-03-04 DIAGNOSIS — Z981 Arthrodesis status: Secondary | ICD-10-CM | POA: Diagnosis not present

## 2011-03-04 DIAGNOSIS — M47817 Spondylosis without myelopathy or radiculopathy, lumbosacral region: Secondary | ICD-10-CM

## 2011-03-04 DIAGNOSIS — M5126 Other intervertebral disc displacement, lumbar region: Secondary | ICD-10-CM | POA: Diagnosis not present

## 2011-03-04 MED ORDER — GADOBENATE DIMEGLUMINE 529 MG/ML IV SOLN
15.0000 mL | Freq: Once | INTRAVENOUS | Status: AC | PRN
  Administered 2011-03-04: 15 mL via INTRAVENOUS

## 2011-03-11 DIAGNOSIS — M545 Low back pain: Secondary | ICD-10-CM | POA: Diagnosis not present

## 2011-03-16 DIAGNOSIS — E785 Hyperlipidemia, unspecified: Secondary | ICD-10-CM | POA: Diagnosis not present

## 2011-03-16 DIAGNOSIS — I739 Peripheral vascular disease, unspecified: Secondary | ICD-10-CM | POA: Diagnosis not present

## 2011-03-16 DIAGNOSIS — I10 Essential (primary) hypertension: Secondary | ICD-10-CM | POA: Diagnosis not present

## 2011-03-16 DIAGNOSIS — E739 Lactose intolerance, unspecified: Secondary | ICD-10-CM | POA: Diagnosis not present

## 2011-04-02 ENCOUNTER — Other Ambulatory Visit: Payer: Self-pay | Admitting: Neurosurgery

## 2011-04-07 ENCOUNTER — Encounter (HOSPITAL_COMMUNITY): Payer: Self-pay | Admitting: Pharmacy Technician

## 2011-04-07 DIAGNOSIS — H538 Other visual disturbances: Secondary | ICD-10-CM | POA: Diagnosis not present

## 2011-04-09 NOTE — Pre-Procedure Instructions (Signed)
[place]  04/09/2011   Your procedure is scheduled on:  Tuesday April 21, 2011 at 0730 am  Report to [facility] at [facility].  Call this number if you have problems the morning of surgery: [phone]   Remember:   Do not eat food:After Midnight.  May have clear liquids: up to 4 Hours before arrival until 0130 am.  Clear liquids include soda, tea, black coffee, apple or grape juice, broth.  Take these medicines the morning of surgery with A SIP OF WATER: may take Timolol eye drop   Do not wear jewelry, make-up or nail polish.  Do not wear lotions, powders, or perfumes. You may wear deodorant.  Do not shave 48 hours prior to surgery.  Do not bring valuables to the hospital.  Contacts, dentures or bridgework may not be worn into surgery.  Leave suitcase in the car. After surgery it may be brought to your room.  For patients admitted to the hospital, checkout time is 11:00 AM the day of discharge.   Patients discharged the day of surgery will not be allowed to drive home.  Name and phone number of your driver:   Special Instructions: CHG Shower Use Special Wash: 1/2 bottle night before surgery and 1/2 bottle morning of surgery.   Please read over the following fact sheets that you were given: Pain Booklet, Coughing and Deep Breathing, Blood Transfusion Information and Surgical Site Infection Prevention

## 2011-04-10 ENCOUNTER — Encounter (HOSPITAL_COMMUNITY)
Admission: RE | Admit: 2011-04-10 | Discharge: 2011-04-10 | Disposition: A | Discharge status: Home / Self Care | Payer: Medicare Other | Source: Ambulatory Visit | Attending: Anesthesiology | Admitting: Anesthesiology

## 2011-04-10 ENCOUNTER — Encounter (HOSPITAL_COMMUNITY)
Admission: RE | Admit: 2011-04-10 | Discharge: 2011-04-10 | Disposition: A | Discharge status: Home / Self Care | Payer: Medicare Other | Source: Ambulatory Visit | Attending: Neurosurgery | Admitting: Neurosurgery

## 2011-04-10 ENCOUNTER — Encounter (HOSPITAL_COMMUNITY): Payer: Self-pay

## 2011-04-10 DIAGNOSIS — M47817 Spondylosis without myelopathy or radiculopathy, lumbosacral region: Secondary | ICD-10-CM | POA: Diagnosis not present

## 2011-04-10 HISTORY — DX: Unspecified osteoarthritis, unspecified site: M19.90

## 2011-04-10 HISTORY — DX: Bronchitis, not specified as acute or chronic: J40

## 2011-04-10 LAB — BASIC METABOLIC PANEL
GFR calc Af Amer: 68 mL/min — ABNORMAL LOW (ref >90)
GFR calc non Af Amer: 59 mL/min — ABNORMAL LOW (ref >90)
Glucose, Bld: 95 mg/dL (ref 70–99)
Potassium: 3.9 mEq/L (ref 3.5–5.1)
Sodium: 139 mEq/L (ref 135–145)

## 2011-04-10 LAB — CBC
Hemoglobin: 14.9 g/dL (ref 13.0–17.0)
RBC: 4.69 MIL/uL (ref 4.22–5.81)

## 2011-04-10 LAB — TYPE AND SCREEN
ABO/RH(D): O POS
Antibody Screen: NEGATIVE

## 2011-04-10 LAB — ABO/RH: ABO/RH(D): O POS

## 2011-04-10 NOTE — Progress Notes (Signed)
EKG performed at Dr. Jacquiline Doe office in December 2012. Office faxed and requested information. Confirmation placed in chart.

## 2011-04-13 NOTE — Progress Notes (Signed)
Received ekg from Dr.Aronson's office, anesthesia please review.

## 2011-04-13 NOTE — Progress Notes (Signed)
Requested ekg from Dr. Jacquiline Doe office, left message for medical records.

## 2011-04-14 NOTE — Consult Note (Signed)
Anesthesia:  Patient is a 78 year old male scheduled for a right L3-4 decompression with fusion on 04/21/11.  History includes HTN, HH, arthritis, bronchitis, former smoker, 3.8 cm AAA (ultrasound 02/23/11), prior back and prostate surgery.  PCP is Dr. Reynaldo Minium at [facility] who saw him pre-operatively.  Per his note patient is "active and functions more like a 78 year old."  Labs noted.  BUN 24, Cr 1.1.  CBC WNL.  CXR from 04/10/11 showed no acute disease.  EKG from Dr. Jacquiline Doe office on 08/18/10 showed SR with first degree AVB, possible anterior infarct (age undetermined). I don't think there is significant change since 2001.  He did not report any CV symptoms during PAT or to Dr. Reynaldo Minium at his last on April 10, 2011.  Anticipate he can proceed if not significant change in his status.

## 2011-04-20 MED ORDER — CEFAZOLIN SODIUM-DEXTROSE 2-3 GM-% IV SOLR
2.0000 g | INTRAVENOUS | Status: AC
  Administered 2011-04-21: 2 g via INTRAVENOUS
  Filled 2011-04-20: qty 50

## 2011-04-20 NOTE — H&P (Signed)
Justin [name]  [id number]  DOB:  04/23/1923  03/11/2011:          Justin [name] returns today to review his MRI which shows that he has significant spondylosis at L3-4 eccentric to the right consistent with his plain radiographs with some mild anterolisthesis of L1 on L2 and L2 on L3.  We talked about treatment options.  He is anxious to get some relief.  While he is 56, he is extremely active, still maintains rental properties and is in good health.  He does have a small abdominal aortic aneurysm which he has already been evaluated for and which his vascular surgeon said was unchanged and did not require intervention.    I have recommended, based on the severity of his pain and significant imaging findings, that he undergo anterolateral decompression and fusion L3-4 level with lateral plating.  He is not a candidate for spinous process plate as he has previously had laminectomy at this level.  He wishes to go ahead with surgery, but is going to discuss it with his family and decide whether he wishes to proceed and he will call to set this up.  We went over patient education information and gave him literature on the procedure and described it in detail, including hospitalization, and he was fitted for a back brace.                                                    Marchia Meiers. Vertell Limber, M.D./sv  NEUROSURGICAL CONSULTATION  Justin [name]  [id number]  DOB:  05-30-1923                                       February 25, 2011   HISTORY OF PRESENT ILLNESS:            Justin [name] comes back today.  I have not seen him since 2007 and, at this point, he comes back complaining of back pain.  He is currently 78 years old.  He notes right-sided low back pain and left lateral foot numbness which he dates to 2004.  He had lumbar fusion by me at L5-S1 in 2004, L5 through S1 disc surgery in 2002, L2 through 4 laminectomy in 2002 and L4-5 diskectomy in 2000.  He has a history of hypertension, ulcer diagnosed in 1977, triple A which  was 3 cm. in 2000 and stable as of Monday 02/23/2011.  He notes the last 6 to 8 weeks he laid an inlay floor and developed pain after that.  He says that he has mid low back pain radiating to the right.  It is not really going to his legs.  He does have some numbness into his left foot.  He walks with a flexed attitude and when he leans back, he describes pain in the region of the L3-4 level.    REVIEW OF SYSTEMS:                    Review of Systems was reviewed with the patient.  Pertinent positives include wears glasses, glaucoma, cataracts, high blood pressure, back pain, neck pain, skin cancer.    PAST MEDICAL HISTORY:  Current Medical Conditions:  As previously described.      Prior Operations and Hospitalizations:  As previously described.      Medications and Allergies:  HE IS ALLERGIC TO DEMEROL.  Current medications include Avodart 0.5 mg twice weekly, Lisinopril/HCTZ 20/12.5 mg qd, Aspirin 81 mg qd, Cosopt one drop in each eye bid.      Height and Weight:  He is currently 5', 9 " tall and 169 lbs.    SOCIAL HISTORY:                           He is a nonsmoker, nondrinker and no history of substance abuse.    DIAGNOSTIC STUDIES:                  We obtained plain radiographs in the office today which show solid previous arthrodesis at the L4 through S1 levels with severe degenerative disc disease and vacuum disc phenomenon at the L3-4 level.    PHYSICAL EXAMINATION:                  General Appearance:  Justin [name] is a pleasant, cooperative man in no acute distress.      Blood Pressure, Pulse and Respiratory Rate:  His blood pressure is 150/90.  Heart rate is 74 and regular.  Respiratory rate is 16.        HEENT - normocephalic, atraumatic.  The pupils are equal, round and reactive to light.  The extraocular muscles are intact.  Sclerae - white.  Conjunctiva - pink.  Oropharynx benign.  Uvula midline.     Neck - there are no masses, meningismus, deformities,  tracheal deviation, jugular vein distention or carotid bruits.  There is normal cervical range of motion.  Spurlings' test is negative without reproducible radicular pain turning the patient's head to either side.  Lhermitte's sign is not present with axial compression.      Respiratory - there is normal respiratory effort with good intercostal function.  Lungs are clear to auscultation.  There are no rales, rhonchi or wheezes.      Cardiovascular - the heart has regular rate and rhythm to auscultation.  No murmurs are appreciated.  There is no extremity edema, cyanosis or clubbing.  There are palpable pedal pulses.      Abdomen - soft, nontender, no hepatosplenomegaly appreciated or masses.  There are active bowel sounds.  No guarding or rebound.      Musculoskeletal Examination - he complains of pain in the region of the L3-4 level eccentric to the right in the paravertebral musculature.  He has limitations in forward bending and extension.  Straight leg raise is positive for back pain.    NEUROLOGICAL EXAMINATION:     The patient is oriented to time, person and place and has good recall of both recent and remote memory with normal attention span and concentration.  The patient speaks with clear and fluent speech and exhibits normal language function and appropriate fund of knowledge.      Cranial Nerve Examination - pupils are equal, round and reactive to light.  Extraocular movements are full.  Visual fields are full to confrontational testing.  Facial sensation and facial movement are symmetric and intact.  Hearing is intact to finger rub.  Palate is upgoing.  Shoulder shrug is symmetric.  Tongue protrudes in the midline.      Motor Examination - motor strength is 5/5 in the bilateral deltoids,  biceps, triceps, handgrips, wrist extensors, interosseous.  In the lower extremities motor strength is 5/5 in hip flexion, extension, quadriceps, hamstrings, plantar flexion, dorsiflexion and extensor  hallucis longus.      Sensory Examination - he does note some numbness to pin in the lateral aspect of his left foot.      Deep Tendon Reflexes - 2 in the biceps, triceps and brachioradialis, 2 at the knees, 2 at the ankles and great toes are downgoing to plantar stimulation.      Cerebellar Examination - normal coordination in upper and lower extremities and normal rapid alternating movements.  Romberg test is negative.    IMPRESSION AND RECOMMENDATIONS:         At this point, I have suggested to Justin [name] that we go ahead with an MRI of his lumbar spine to better clarify if he has significant nerve root compression and the severity of the structural pathology at the L3-4 level.  He wants to go ahead and do this.  I will see him back after that has been done and make further recommendations at that point.    [place] Vertell Limber, M.D.  JDS:sv

## 2011-04-21 ENCOUNTER — Inpatient Hospital Stay (HOSPITAL_COMMUNITY): Payer: Medicare Other | Admitting: Vascular Surgery

## 2011-04-21 ENCOUNTER — Encounter (HOSPITAL_COMMUNITY)
Admission: RE | Disposition: A | Discharge status: Home / Self Care | Payer: Self-pay | Source: Ambulatory Visit | Attending: Neurosurgery

## 2011-04-21 ENCOUNTER — Encounter (HOSPITAL_COMMUNITY): Payer: Self-pay | Admitting: Vascular Surgery

## 2011-04-21 ENCOUNTER — Inpatient Hospital Stay (HOSPITAL_COMMUNITY)
Admission: RE | Admit: 2011-04-21 | Discharge: 2011-04-26 | DRG: 460 | Disposition: A | Discharge status: Home / Self Care | Payer: Medicare Other | Source: Ambulatory Visit | Attending: Neurosurgery | Admitting: Neurosurgery

## 2011-04-21 ENCOUNTER — Encounter (HOSPITAL_COMMUNITY): Payer: Self-pay | Admitting: *Deleted

## 2011-04-21 ENCOUNTER — Inpatient Hospital Stay (HOSPITAL_COMMUNITY): Payer: Medicare Other

## 2011-04-21 ENCOUNTER — Encounter (HOSPITAL_COMMUNITY): Payer: Self-pay | Admitting: General Practice

## 2011-04-21 DIAGNOSIS — M47817 Spondylosis without myelopathy or radiculopathy, lumbosacral region: Secondary | ICD-10-CM | POA: Diagnosis not present

## 2011-04-21 DIAGNOSIS — I10 Essential (primary) hypertension: Secondary | ICD-10-CM | POA: Diagnosis present

## 2011-04-21 DIAGNOSIS — I4891 Unspecified atrial fibrillation: Secondary | ICD-10-CM | POA: Diagnosis not present

## 2011-04-21 DIAGNOSIS — M4000 Postural kyphosis, site unspecified: Secondary | ICD-10-CM | POA: Diagnosis present

## 2011-04-21 DIAGNOSIS — Z981 Arthrodesis status: Secondary | ICD-10-CM | POA: Diagnosis not present

## 2011-04-21 DIAGNOSIS — M4310 Spondylolisthesis, site unspecified: Secondary | ICD-10-CM | POA: Diagnosis not present

## 2011-04-21 DIAGNOSIS — Z87891 Personal history of nicotine dependence: Secondary | ICD-10-CM | POA: Diagnosis not present

## 2011-04-21 DIAGNOSIS — M545 Low back pain: Secondary | ICD-10-CM | POA: Diagnosis not present

## 2011-04-21 DIAGNOSIS — I369 Nonrheumatic tricuspid valve disorder, unspecified: Secondary | ICD-10-CM | POA: Diagnosis not present

## 2011-04-21 HISTORY — DX: Unspecified atrial fibrillation: I48.91

## 2011-04-21 HISTORY — PX: BACK SURGERY: SHX140

## 2011-04-21 HISTORY — DX: Gastric ulcer, unspecified as acute or chronic, without hemorrhage or perforation: K25.9

## 2011-04-21 HISTORY — PX: ANTERIOR LAT LUMBAR FUSION: SHX1168

## 2011-04-21 HISTORY — DX: Other chronic pain: G89.29

## 2011-04-21 HISTORY — DX: Spinal stenosis, lumbar region without neurogenic claudication: M48.061

## 2011-04-21 HISTORY — DX: Spondylosis without myelopathy or radiculopathy, lumbar region: M47.816

## 2011-04-21 HISTORY — DX: Low back pain: M54.5

## 2011-04-21 HISTORY — DX: Gout, unspecified: M10.9

## 2011-04-21 HISTORY — DX: Gastro-esophageal reflux disease without esophagitis: K21.9

## 2011-04-21 SURGERY — ANTERIOR LATERAL LUMBAR FUSION 1 LEVEL
Anesthesia: General | Laterality: Right

## 2011-04-21 MED ORDER — FENTANYL CITRATE 0.05 MG/ML IJ SOLN
INTRAMUSCULAR | Status: DC | PRN
  Administered 2011-04-21: 25 ug via INTRAVENOUS
  Administered 2011-04-21: 150 ug via INTRAVENOUS

## 2011-04-21 MED ORDER — LISINOPRIL-HYDROCHLOROTHIAZIDE 20-12.5 MG PO TABS: 1.0000 | ORAL_TABLET | Freq: Every day | ORAL | Status: DC

## 2011-04-21 MED ORDER — SODIUM CHLORIDE 0.9 % IV SOLN
INTRAVENOUS | Status: AC
  Filled 2011-04-21: qty 500

## 2011-04-21 MED ORDER — DIPHENHYDRAMINE HCL 12.5 MG/5ML PO ELIX
12.5000 mg | ORAL_SOLUTION | Freq: Four times a day (QID) | ORAL | Status: DC | PRN
  Filled 2011-04-21: qty 5

## 2011-04-21 MED ORDER — MORPHINE SULFATE (PF) 1 MG/ML IV SOLN
INTRAVENOUS | Status: DC
  Administered 2011-04-21: 12 mg via INTRAVENOUS
  Administered 2011-04-22: 04:00:00 via INTRAVENOUS
  Administered 2011-04-22: 7.5 mg via INTRAVENOUS

## 2011-04-21 MED ORDER — 0.9 % SODIUM CHLORIDE (POUR BTL) OPTIME
TOPICAL | Status: DC | PRN
  Administered 2011-04-21: 1000 mL

## 2011-04-21 MED ORDER — KCL IN DEXTROSE-NACL 20-5-0.45 MEQ/L-%-% IV SOLN
INTRAVENOUS | Status: DC
  Administered 2011-04-21 (×2): via INTRAVENOUS
  Filled 2011-04-21 (×10): qty 1000

## 2011-04-21 MED ORDER — PHENYLEPHRINE HCL 10 MG/ML IJ SOLN
INTRAMUSCULAR | Status: DC | PRN
  Administered 2011-04-21: 40 ug via INTRAVENOUS
  Administered 2011-04-21 (×2): 80 ug via INTRAVENOUS
  Administered 2011-04-21: 40 ug via INTRAVENOUS
  Administered 2011-04-21 (×3): 80 ug via INTRAVENOUS

## 2011-04-21 MED ORDER — METHOCARBAMOL 500 MG PO TABS
500.0000 mg | ORAL_TABLET | Freq: Four times a day (QID) | ORAL | Status: DC | PRN
  Administered 2011-04-22: 500 mg via ORAL
  Filled 2011-04-21 (×2): qty 1

## 2011-04-21 MED ORDER — HYDROMORPHONE HCL PF 1 MG/ML IJ SOLN
INTRAMUSCULAR | Status: AC
  Filled 2011-04-21: qty 1

## 2011-04-21 MED ORDER — LIDOCAINE HCL (CARDIAC) 20 MG/ML IV SOLN
INTRAVENOUS | Status: DC | PRN
  Administered 2011-04-21: 50 mg via INTRAVENOUS

## 2011-04-21 MED ORDER — KCL IN DEXTROSE-NACL 20-5-0.45 MEQ/L-%-% IV SOLN
INTRAVENOUS | Status: AC
  Filled 2011-04-21: qty 1000

## 2011-04-21 MED ORDER — MORPHINE SULFATE (PF) 1 MG/ML IV SOLN
INTRAVENOUS | Status: AC
  Administered 2011-04-21: 13:00:00
  Filled 2011-04-21: qty 25

## 2011-04-21 MED ORDER — METHOCARBAMOL 100 MG/ML IJ SOLN: 500.0000 mg | Freq: Four times a day (QID) | INTRAVENOUS | Status: DC | PRN

## 2011-04-21 MED ORDER — SODIUM CHLORIDE 0.9 % IJ SOLN: 9.0000 mL | INTRAMUSCULAR | Status: DC | PRN

## 2011-04-21 MED ORDER — CEFAZOLIN SODIUM 1-5 GM-% IV SOLN
1.0000 g | Freq: Three times a day (TID) | INTRAVENOUS | Status: AC
  Administered 2011-04-21 (×2): 1 g via INTRAVENOUS
  Filled 2011-04-21 (×2): qty 50

## 2011-04-21 MED ORDER — OXYCODONE-ACETAMINOPHEN 5-325 MG PO TABS: 1.0000 | ORAL_TABLET | ORAL | Status: DC | PRN

## 2011-04-21 MED ORDER — ONDANSETRON HCL 4 MG/2ML IJ SOLN: 4.0000 mg | Freq: Once | INTRAMUSCULAR | Status: DC | PRN

## 2011-04-21 MED ORDER — DORZOLAMIDE HCL-TIMOLOL MAL 2-0.5 % OP SOLN
1.0000 [drp] | Freq: Two times a day (BID) | OPHTHALMIC | Status: DC
  Administered 2011-04-21 – 2011-04-26 (×11): 1 [drp] via OPHTHALMIC
  Filled 2011-04-21: qty 10

## 2011-04-21 MED ORDER — ONDANSETRON HCL 4 MG/2ML IJ SOLN
4.0000 mg | Freq: Four times a day (QID) | INTRAMUSCULAR | Status: DC | PRN
  Filled 2011-04-21: qty 2

## 2011-04-21 MED ORDER — ONDANSETRON HCL 4 MG/2ML IJ SOLN
4.0000 mg | INTRAMUSCULAR | Status: DC | PRN
  Administered 2011-04-22: 4 mg via INTRAVENOUS
  Filled 2011-04-21: qty 2

## 2011-04-21 MED ORDER — SUCCINYLCHOLINE CHLORIDE 20 MG/ML IJ SOLN
INTRAMUSCULAR | Status: DC | PRN
  Administered 2011-04-21: 80 mg via INTRAVENOUS

## 2011-04-21 MED ORDER — ACETAMINOPHEN 325 MG PO TABS: 650.0000 mg | ORAL_TABLET | ORAL | Status: DC | PRN

## 2011-04-21 MED ORDER — HYDROCODONE-ACETAMINOPHEN 5-325 MG PO TABS: 1.0000 | ORAL_TABLET | ORAL | Status: DC | PRN

## 2011-04-21 MED ORDER — DIPHENHYDRAMINE HCL 50 MG/ML IJ SOLN: 12.5000 mg | Freq: Four times a day (QID) | INTRAMUSCULAR | Status: DC | PRN

## 2011-04-21 MED ORDER — SODIUM CHLORIDE 0.9 % IR SOLN
Status: DC | PRN
  Administered 2011-04-21: 07:00:00

## 2011-04-21 MED ORDER — SODIUM CHLORIDE 0.9 % IJ SOLN: 3.0000 mL | INTRAMUSCULAR | Status: DC | PRN

## 2011-04-21 MED ORDER — PHENOL 1.4 % MT LIQD: 1.0000 | OROMUCOSAL | Status: DC | PRN

## 2011-04-21 MED ORDER — LISINOPRIL 20 MG PO TABS
20.0000 mg | ORAL_TABLET | Freq: Every day | ORAL | Status: DC
  Administered 2011-04-21 – 2011-04-26 (×6): 20 mg via ORAL
  Filled 2011-04-21 (×6): qty 1

## 2011-04-21 MED ORDER — ZOLPIDEM TARTRATE 5 MG PO TABS: 5.0000 mg | ORAL_TABLET | Freq: Every evening | ORAL | Status: DC | PRN

## 2011-04-21 MED ORDER — DOCUSATE SODIUM 100 MG PO CAPS
100.0000 mg | ORAL_CAPSULE | Freq: Two times a day (BID) | ORAL | Status: DC
  Administered 2011-04-21 – 2011-04-26 (×10): 100 mg via ORAL
  Filled 2011-04-21 (×10): qty 1

## 2011-04-21 MED ORDER — MENTHOL 3 MG MT LOZG: 1.0000 | LOZENGE | OROMUCOSAL | Status: DC | PRN

## 2011-04-21 MED ORDER — SODIUM CHLORIDE 0.9 % IV SOLN
250.0000 mL | INTRAVENOUS | Status: DC
  Administered 2011-04-22: 250 mL via INTRAVENOUS

## 2011-04-21 MED ORDER — LACTATED RINGERS IV SOLN
INTRAVENOUS | Status: DC | PRN
  Administered 2011-04-21 (×2): via INTRAVENOUS

## 2011-04-21 MED ORDER — NALOXONE HCL 0.4 MG/ML IJ SOLN: 0.4000 mg | INTRAMUSCULAR | Status: DC | PRN

## 2011-04-21 MED ORDER — EPHEDRINE SULFATE 50 MG/ML IJ SOLN
INTRAMUSCULAR | Status: DC | PRN
  Administered 2011-04-21 (×2): 10 mg via INTRAVENOUS
  Administered 2011-04-21: 5 mg via INTRAVENOUS
  Administered 2011-04-21: 10 mg via INTRAVENOUS
  Administered 2011-04-21: 5 mg via INTRAVENOUS

## 2011-04-21 MED ORDER — LIDOCAINE-EPINEPHRINE 1 %-1:100000 IJ SOLN
INTRAMUSCULAR | Status: DC | PRN
  Administered 2011-04-21: 5 mL

## 2011-04-21 MED ORDER — HYDROCHLOROTHIAZIDE 12.5 MG PO CAPS
12.5000 mg | ORAL_CAPSULE | Freq: Every day | ORAL | Status: DC
  Administered 2011-04-21 – 2011-04-26 (×6): 12.5 mg via ORAL
  Filled 2011-04-21 (×6): qty 1

## 2011-04-21 MED ORDER — ONDANSETRON HCL 4 MG/2ML IJ SOLN
INTRAMUSCULAR | Status: DC | PRN
  Administered 2011-04-21: 4 mg via INTRAVENOUS

## 2011-04-21 MED ORDER — ACETAMINOPHEN 650 MG RE SUPP: 650.0000 mg | RECTAL | Status: DC | PRN

## 2011-04-21 MED ORDER — MORPHINE SULFATE (PF) 1 MG/ML IV SOLN
INTRAVENOUS | Status: AC
  Filled 2011-04-21: qty 25

## 2011-04-21 MED ORDER — BUPIVACAINE HCL (PF) 0.25 % IJ SOLN
INTRAMUSCULAR | Status: DC | PRN
  Administered 2011-04-21: 5 mL

## 2011-04-21 MED ORDER — PROPOFOL 10 MG/ML IV EMUL
INTRAVENOUS | Status: DC | PRN
  Administered 2011-04-21: 150 mg via INTRAVENOUS

## 2011-04-21 MED ORDER — HYDROMORPHONE HCL PF 1 MG/ML IJ SOLN
0.2500 mg | INTRAMUSCULAR | Status: DC | PRN
  Administered 2011-04-21 (×2): 0.5 mg via INTRAVENOUS

## 2011-04-21 MED ORDER — SODIUM CHLORIDE 0.9 % IJ SOLN
3.0000 mL | Freq: Two times a day (BID) | INTRAMUSCULAR | Status: DC
  Administered 2011-04-21 – 2011-04-26 (×9): 3 mL via INTRAVENOUS

## 2011-04-21 MED ORDER — BACITRACIN 50000 UNITS IM SOLR
INTRAMUSCULAR | Status: AC
  Filled 2011-04-21: qty 1

## 2011-04-21 SURGICAL SUPPLY — 64 items
ADH SKN CLS APL DERMABOND .7 (GAUZE/BANDAGES/DRESSINGS) ×1
BAG DECANTER FOR FLEXI CONT (MISCELLANEOUS) ×2 IMPLANT
BLADE SURG ROTATE 9660 (MISCELLANEOUS) IMPLANT
BOLT 5.5X55MM (Bolt) ×2 IMPLANT
BOLT 5.5X60MM (Bolt) ×1 IMPLANT
BONE VOID FILLER STRIP 10CC (Bone Implant) ×1 IMPLANT
CLOTH BEACON ORANGE TIMEOUT ST (SAFETY) ×2 IMPLANT
CONT SPEC 4OZ CLIKSEAL STRL BL (MISCELLANEOUS) IMPLANT
CORENT WIDE 10X22X55 (Orthopedic Implant) ×2 IMPLANT
COROENT WIDE 10X22X55 (Orthopedic Implant) IMPLANT
COVER BACK TABLE 24X17X13 BIG (DRAPES) IMPLANT
DERMABOND ADVANCED (GAUZE/BANDAGES/DRESSINGS) ×1
DERMABOND ADVANCED .7 DNX12 (GAUZE/BANDAGES/DRESSINGS) ×2 IMPLANT
DRAPE C-ARM 42X72 X-RAY (DRAPES) ×2 IMPLANT
DRAPE C-ARMOR (DRAPES) ×2 IMPLANT
DRAPE LAPAROTOMY 100X72X124 (DRAPES) ×2 IMPLANT
DRAPE POUCH INSTRU U-SHP 10X18 (DRAPES) ×2 IMPLANT
DURAPREP 26ML APPLICATOR (WOUND CARE) ×2 IMPLANT
ELECT REM PT RETURN 9FT ADLT (ELECTROSURGICAL) ×2
ELECTRODE REM PT RTRN 9FT ADLT (ELECTROSURGICAL) ×1 IMPLANT
GAUZE SPONGE 4X4 16PLY XRAY LF (GAUZE/BANDAGES/DRESSINGS) IMPLANT
GLOVE BIO SURGEON STRL SZ8 (GLOVE) ×4 IMPLANT
GLOVE BIOGEL M 8.0 STRL (GLOVE) ×1 IMPLANT
GLOVE BIOGEL PI IND STRL 8 (GLOVE) ×1 IMPLANT
GLOVE BIOGEL PI IND STRL 8.5 (GLOVE) ×1 IMPLANT
GLOVE BIOGEL PI INDICATOR 8 (GLOVE) ×2
GLOVE BIOGEL PI INDICATOR 8.5 (GLOVE) ×1
GLOVE ECLIPSE 7.5 STRL STRAW (GLOVE) ×2 IMPLANT
GLOVE EXAM NITRILE LRG STRL (GLOVE) IMPLANT
GLOVE EXAM NITRILE MD LF STRL (GLOVE) ×2 IMPLANT
GLOVE EXAM NITRILE XL STR (GLOVE) IMPLANT
GLOVE EXAM NITRILE XS STR PU (GLOVE) IMPLANT
GLOVE INDICATOR 8.5 STRL (GLOVE) ×4 IMPLANT
GOWN BRE IMP SLV AUR LG STRL (GOWN DISPOSABLE) IMPLANT
GOWN BRE IMP SLV AUR XL STRL (GOWN DISPOSABLE) ×4 IMPLANT
GOWN STRL REIN 2XL LVL4 (GOWN DISPOSABLE) ×2 IMPLANT
KIT BASIN OR (CUSTOM PROCEDURE TRAY) ×2 IMPLANT
KIT DILATOR XLIF 5 (KITS) IMPLANT
KIT INFUSE X SMALL 1.4CC (Orthopedic Implant) ×2 IMPLANT
KIT MAXCESS (KITS) ×1 IMPLANT
KIT NEEDLE NVM5 EMG ELECT (KITS) ×1 IMPLANT
KIT NEEDLE NVM5 EMG ELECTRODE (KITS) ×1
KIT ROOM TURNOVER OR (KITS) ×2 IMPLANT
KIT XLIF (KITS) ×1
NDL HYPO 25X1 1.5 SAFETY (NEEDLE) ×1 IMPLANT
NEEDLE HYPO 25X1 1.5 SAFETY (NEEDLE) ×2 IMPLANT
NS IRRIG 1000ML POUR BTL (IV SOLUTION) ×2 IMPLANT
NUT LOCK NUVASIVE (Orthopedic Implant) ×2 IMPLANT
PACK LAMINECTOMY NEURO (CUSTOM PROCEDURE TRAY) ×2 IMPLANT
PLATE XLP 12MM (Plate) ×2 IMPLANT
SLEEVE SURGEON STRL (DRAPES) ×1 IMPLANT
SPONGE LAP 4X18 X RAY DECT (DISPOSABLE) IMPLANT
SPONGE SURGIFOAM ABS GEL SZ50 (HEMOSTASIS) IMPLANT
STAPLER SKIN PROX WIDE 3.9 (STAPLE) ×2 IMPLANT
SUT VIC AB 1 CT1 18XBRD ANBCTR (SUTURE) ×1 IMPLANT
SUT VIC AB 1 CT1 8-18 (SUTURE) ×2
SUT VIC AB 2-0 CT1 18 (SUTURE) ×2 IMPLANT
SUT VIC AB 3-0 SH 8-18 (SUTURE) ×3 IMPLANT
SYR 20ML ECCENTRIC (SYRINGE) ×2 IMPLANT
TAPE CLOTH 3X10 TAN LF (GAUZE/BANDAGES/DRESSINGS) ×6 IMPLANT
TOWEL OR 17X24 6PK STRL BLUE (TOWEL DISPOSABLE) ×2 IMPLANT
TOWEL OR 17X26 10 PK STRL BLUE (TOWEL DISPOSABLE) ×2 IMPLANT
TRAY FOLEY CATH 14FRSI W/METER (CATHETERS) ×2 IMPLANT
WATER STERILE IRR 1000ML POUR (IV SOLUTION) ×2 IMPLANT

## 2011-04-21 NOTE — Anesthesia Postprocedure Evaluation (Signed)
  Anesthesia Post-op Note  Patient: Justin [name]  Procedure(s) Performed: Procedure(s) (LRB): ANTERIOR LATERAL LUMBAR FUSION 1 LEVEL (Right)  Patient Location: PACU  Anesthesia Type: General  Level of Consciousness: awake, alert  and patient cooperative  Airway and Oxygen Therapy: Patient Spontanous Breathing and Patient connected to nasal cannula oxygen  Post-op Pain: mild  Post-op Assessment: Post-op Vital signs reviewed, Patient's Cardiovascular Status Stable, Respiratory Function Stable, Patent Airway, No signs of Nausea or vomiting and Pain level controlled  Post-op Vital Signs: stable  Complications: No apparent anesthesia complications

## 2011-04-21 NOTE — Transfer of Care (Signed)
Immediate Anesthesia Transfer of Care Note  Patient: Justin [name]  Procedure(s) Performed: Procedure(s) (LRB): ANTERIOR LATERAL LUMBAR FUSION 1 LEVEL (Right)  Patient Location: PACU  Anesthesia Type: General  Level of Consciousness: awake, alert  and oriented  Airway & Oxygen Therapy: Patient Spontanous Breathing and Patient connected to nasal cannula oxygen  Post-op Assessment: Report given to PACU RN, Post -op Vital signs reviewed and stable and Patient moving all extremities X 4  Post vital signs: Reviewed and stable  Complications: No apparent anesthesia complications

## 2011-04-21 NOTE — OR Nursing (Signed)
Nvasive nerve monitoring needle electrodes applied to both lower legs by Bryna Colander RN and Reece Levy RN

## 2011-04-21 NOTE — Anesthesia Preprocedure Evaluation (Addendum)
Anesthesia Evaluation  Patient identified by MRN, date of birth, ID band Patient awake    Reviewed: Allergy & Precautions, H&P , NPO status , Patient's Chart, lab work & pertinent test results, reviewed documented beta blocker date and time   History of Anesthesia Complications (+) DIFFICULT AIRWAY  Airway Mallampati: II TM Distance: >3 FB     Dental  (+) Dental Advidsory Given   Pulmonary          Cardiovascular hypertension, On Medications     Neuro/Psych  Neuromuscular disease    GI/Hepatic hiatal hernia,   Endo/Other    Renal/GU      Musculoskeletal   Abdominal   Peds  Hematology   Anesthesia Other Findings   Reproductive/Obstetrics                          Anesthesia Physical Anesthesia Plan  ASA: II  Anesthesia Plan: General   Post-op Pain Management:    Induction: Intravenous  Airway Management Planned: Oral ETT  Additional Equipment:   Intra-op Plan:   Post-operative Plan: Extubation in OR  Informed Consent: I have reviewed the patients History and Physical, chart, labs and discussed the procedure including the risks, benefits and alternatives for the proposed anesthesia with the patient or authorized representative who has indicated his/her understanding and acceptance.   Dental Advisory Given  Plan Discussed with: Anesthesiologist, CRNA and Surgeon  Anesthesia Plan Comments:        Anesthesia Quick Evaluation

## 2011-04-21 NOTE — Preoperative (Signed)
Beta Blockers   Reason not to administer Beta Blockers:Not Applicable 

## 2011-04-21 NOTE — Progress Notes (Signed)
Subjective: Patient reports back pain, no abdominal discomfort.  Pain well managed with PCA.  No longer any confusion.  Objective: Vital signs in last 24 hours: Temp:  [97.9 F (36.6 C)-98.6 F (37 C)] 98.6 F (37 C) (03/12 1827) Pulse Rate:  [81-110] 98  (03/12 1827) Resp:  [14-22] 20  (03/12 1827) BP: (135-167)/(69-83) 150/70 mmHg (03/12 1827) SpO2:  [95 %-100 %] 99 % (03/12 1827) Weight:  [81.4 kg (179 lb 7.3 oz)] 81.4 kg (179 lb 7.3 oz) (03/12 1858)  Intake/Output from previous day:   Intake/Output this shift:    Physical Exam: Flank incisions CDI.  Abdomen distended, but soft, non-tender, with active bowel sounds and no rebound. No leg pain or numbness.  Full lower extremity strength in DF/PF bilaterally.  Lab Results: No results found for this basename: WBC:2,HGB:2,HCT:2,PLT:2 in the last 72 hours BMET No results found for this basename: NA:2,K:2,CL:2,CO2:2,GLUCOSE:2,BUN:2,CREATININE:2,CALCIUM:2 in the last 72 hours  Studies/Results: Dg Lumbar Spine 2-3 Views  04/21/2011  *RADIOLOGY REPORT*  Clinical Data: Lumbar fusion  LUMBAR SPINE - 2-3 VIEW  Comparison: 03/04/2011  Findings: Three fluoroscopic spot intraoperative radiographs document changes of instrumented right   anterolateral and interbody fusion L3-4. Previously placed fixation hardware L4-S1 is noted.  IMPRESSION:  1.  Anterolateral and interbody fusion L3-4  Original Report Authenticated By: Trecia Rogers, M.D.    Assessment/Plan: Doing well following anterolateral fusion L3/4.  Observe, mobilize in am.    LOS: 0 days    Peggyann Shoals, MD 04/21/2011, 7:22 PM

## 2011-04-21 NOTE — OR Nursing (Signed)
Needle electrodes removed at end of procedure

## 2011-04-21 NOTE — Op Note (Signed)
04/21/2011  10:20 AM  PATIENT:  Justin [name]  78 y.o. male  PRE-OPERATIVE DIAGNOSIS:  Spondylolisthesis, lumbar spondylosis, kyphosis, stenosis, radiculopathy L3/4 POST-OPERATIVE DIAGNOSIS:  Spondylolisthesis, lumbar spondylosis, kyphosis, stenosis, radiculopathy L3/4 PROCEDURE:  Procedure(s) (LRB): ANTERIOR LATERAL LUMBAR FUSION 1 LEVEL with PEEK cage, allograft and lateral plate (Right)  SURGEON:  Surgeon(s) and Role:    * Erline Levine, MD - Primary    * Floyce Stakes, MD - Assisting  PHYSICIAN ASSISTANT:   ASSISTANTS: [place], RN   ANESTHESIA:   general  EBL:  Total I/O In: 1800 [I.V.:1800] Out: 250 [Urine:250]  BLOOD ADMINISTERED:none  DRAINS: none   LOCAL MEDICATIONS USED:  LIDOCAINE   SPECIMEN:  No Specimen  DISPOSITION OF SPECIMEN:  N/A  COUNTS:  YES  TOURNIQUET:  * No tourniquets in log *  DICTATION: Patient is a 78 year old with severe spondylosis stenosis and kyphosis of the lumbar spine at the L3/4 level. It was elected to taken to surgery for anterolateral decompression and lateral plating.  Procedure: Patient was brought to the operating room and placed in a left lateral decubitus position on the operative table and using orthogonally projected C-arm fluoroscopy the patient was placed so that the  L3-4  level was visualized in AP and lateral plane. The patient was then taped into position. The table was flexed so as to expose the L3/4 level. Skin was marked along with a posterior finger dissection incision. His flank was then prepped and draped in usual sterile fashion and incisions were made sequentially at the L3-4 level. Posterior finger dissection was made to enter the retroperitoneal space and then subsequently the probe was inserted into the psoas muscle from the left side initially at the L3/4 level. After mapping the neural elements were able to dock the probe per the midpoint of this vertebral level and without indications electrically of too close  proximity to the neural tissues. Subsequently the self-retaining tractor was.after sequential dilators were utilized the shim was employed and the interspace was cleared of psoas muscle and then incised. A thorough discectomy was performed. After thorough discectomy was performed and this was performed using AP and lateral fluoroscopy a 10 lordotic by 55 x 22 mm implant was packed with BMP and NexOss. This was tamped into position using the slides and its position was confirmed on AP and lateral fluoroscopy. Subsequently alateral plate was affixed to the spine at L3/4 using 60 mm and 55 mm cannulated screws and a 12 mm plate.  The bone was extremely sclerotic.  Lateral X ray demonstrated that the plate was anteriorly positioned and the anterior osteophyte at L3 was fractured. Consideration was given to revising the plate, but it was felt that the inferior bolt had good purchase, that the cage was ideally positioned, and that placing another screw in L3 had the potential of disrupting the endplate. Soft tissues were inspected and found to be in good repair with excellent hemostasis. The wounds were closed with interrupted vicryl sutures and dressed with derma bond.The patient was then extubated in the operating room and taken to recovery in stable and satisfactory condition having tolerated his operation well. Counts were correct at the end of the case.   PLAN OF CARE: Admit to inpatient   PATIENT DISPOSITION:  PACU - hemodynamically stable.   Delay start of Pharmacological VTE agent (>24hrs) due to surgical blood loss or risk of bleeding: yes

## 2011-04-21 NOTE — Progress Notes (Signed)
Pt's abdomen noted to be distended on arrival to PACU; Dr Vertell Limber to bedside for assessment; Will continue to monitor closely in PACU for now.  Ameirah Khatoon Therapist, music

## 2011-04-21 NOTE — Interval H&P Note (Signed)
History and Physical Interval Note:  04/21/2011 7:32 AM  Justin [name]  has presented today for surgery, with the diagnosis of spondylolisthesis lumbar spondylosis lumbago  The various methods of treatment have been discussed with the patient and family. After consideration of risks, benefits and other options for treatment, the patient has consented to  Procedure(s) (LRB): ANTERIOR LATERAL LUMBAR FUSION 1 LEVEL (Right) as a surgical intervention .  The patients' history has been reviewed, patient examined, no change in status, stable for surgery.  I have reviewed the patients' chart and labs.  Questions were answered to the patient's satisfaction.     [name] D  Date of Initial H&P:04/20/2011  History reviewed, patient examined, no change in status, stable for surgery.

## 2011-04-21 NOTE — Anesthesia Procedure Notes (Signed)
Procedure Name: Intubation Date/Time: 04/21/2011 7:39 AM Performed by: Neldon Newport Pre-anesthesia Checklist: Emergency Drugs available, Patient identified, Timeout performed, Suction available and Patient being monitored Patient Re-evaluated:Patient Re-evaluated prior to inductionOxygen Delivery Method: Circle system utilized Preoxygenation: Pre-oxygenation with 100% oxygen Intubation Type: IV induction Ventilation: Mask ventilation without difficulty Laryngoscope Size: Mac and 3 Grade View: Grade I Tube type: Oral Tube size: 7.5 mm Number of attempts: 1 Placement Confirmation: ETT inserted through vocal cords under direct vision,  breath sounds checked- equal and bilateral and positive ETCO2 Secured at: 23 cm Tube secured with: Tape Dental Injury: Teeth and Oropharynx as per pre-operative assessment

## 2011-04-21 NOTE — Progress Notes (Signed)
Pt brought to unit from Neuro PACU at 1300, upon assessment with the PACU nurse, patient's abdomen was and still is distended.  MD was notified in the PACU and is aware of the distention.  Pt has bowel sounds in all 4 quadrants and denies any abdominal pain or tenderness. Will continue to monitor.

## 2011-04-22 ENCOUNTER — Other Ambulatory Visit: Payer: Self-pay

## 2011-04-22 ENCOUNTER — Encounter (HOSPITAL_COMMUNITY): Payer: Self-pay | Admitting: Nurse Practitioner

## 2011-04-22 DIAGNOSIS — I4891 Unspecified atrial fibrillation: Secondary | ICD-10-CM

## 2011-04-22 LAB — MAGNESIUM: Magnesium: 1.8 mg/dL (ref 1.5–2.5)

## 2011-04-22 LAB — BASIC METABOLIC PANEL
GFR calc non Af Amer: 60 mL/min — ABNORMAL LOW (ref >90)
Glucose, Bld: 171 mg/dL — ABNORMAL HIGH (ref 70–99)
Potassium: 4.1 mEq/L (ref 3.5–5.1)
Sodium: 131 mEq/L — ABNORMAL LOW (ref 135–145)

## 2011-04-22 LAB — CBC
Hemoglobin: 13.9 g/dL (ref 13.0–17.0)
MCHC: 34 g/dL (ref 30.0–36.0)
RBC: 4.41 MIL/uL (ref 4.22–5.81)

## 2011-04-22 MED ORDER — AMIODARONE LOAD VIA INFUSION
300.0000 mg | Freq: Once | INTRAVENOUS | Status: AC
  Administered 2011-04-22: 300 mg via INTRAVENOUS
  Filled 2011-04-22: qty 166.67

## 2011-04-22 MED ORDER — AMIODARONE HCL IN DEXTROSE 360-4.14 MG/200ML-% IV SOLN
30.0000 mg/h | INTRAVENOUS | Status: DC
  Administered 2011-04-23: 30 mg/h via INTRAVENOUS
  Filled 2011-04-22 (×3): qty 200

## 2011-04-22 MED ORDER — AMIODARONE HCL IN DEXTROSE 360-4.14 MG/200ML-% IV SOLN
60.0000 mg/h | INTRAVENOUS | Status: AC
  Administered 2011-04-22 (×2): 60 mg/h via INTRAVENOUS
  Filled 2011-04-22 (×3): qty 200

## 2011-04-22 MED ORDER — METOPROLOL TARTRATE 1 MG/ML IV SOLN
5.0000 mg | Freq: Once | INTRAVENOUS | Status: AC
  Administered 2011-04-22: 5 mg via INTRAVENOUS
  Filled 2011-04-22: qty 5

## 2011-04-22 NOTE — Progress Notes (Signed)
UR COMPLETED  

## 2011-04-22 NOTE — Progress Notes (Signed)
Rapid Response was called about pt's evaluated heart rate.  Stat EKG was done per Rapid Response RN orders and Dr. Christella Noa was notified about the patient's condition. Pt stable, stable vitals except elevated heart rate.  Rapid Response came to beside, will continue to evaluate patient.

## 2011-04-22 NOTE — Evaluation (Signed)
Physical Therapy Evaluation Patient Details Name: Justin [name] MRN: [id number] DOB: 1923-03-06 Today's Date: 04/22/2011  Problem List:  Patient Active Problem List  Diagnoses  . Abdominal aneurysm without mention of rupture    Past Medical History:  Past Medical History  Diagnosis Date  . Hiatal hernia   . AAA (abdominal aortic aneurysm)     "we've known about it since ~ 2000"  . Hypertension     Takes lisinopril/hctz  . Bronchitis     hx of; "once or twice in my life" (04/21/11)  . Arthritis     in back and hands  . GERD (gastroesophageal reflux disease)   . Stomach ulcer     "years ago; treated w/diet" (04/21/11  . Migraine     "left ~ 1980's"  . Chronic lower back pain 1995  . Gout of big toe     "h/o in both"   Past Surgical History:  Past Surgical History  Procedure Date  . Prostate surgery   . Eye surgery   . Anterior lat lumbar fusion 04/21/11    w/PEEK cage, allograft, and lateral plate (r)  . Tonsillectomy     "as a child"  . Appendectomy 1946  . Back surgery 04/21/11    "today makes the 5th time"  . Cataract extraction, bilateral     PT Assessment/Plan/Recommendation PT Assessment Clinical Impression Statement: Pt s/p anterior lateral lumbar fusion thus affecting PLOF. Pt presents with high motivation and overal good mobility however anticipate pt may need a few days to reach level where pt and wife feel comfortable with D/C home. Will need to assess stairs. PT Recommendation/Assessment: Patient will need skilled PT in the acute care venue PT Problem List: Decreased activity tolerance;Decreased balance;Decreased mobility;Pain;Decreased knowledge of use of DME PT Therapy Diagnosis : Difficulty walking;Abnormality of gait;Acute pain PT Plan PT Frequency: Min 5X/week PT Treatment/Interventions: DME instruction;Gait training;Stair training;Functional mobility training;Therapeutic activities;Therapeutic exercise;Balance training;Patient/family education PT  Recommendation Follow Up Recommendations: Home health PT Equipment Recommended: 3 in 1 bedside comode;Rolling walker with 5" wheels ((RW if wife unable to obtain a RW)) PT Goals  Acute Rehab PT Goals PT Goal Formulation: With patient Time For Goal Achievement: 7 days Pt will Roll Supine to Right Side: Independently PT Goal: Rolling Supine to Right Side - Progress: Goal set today Pt will Roll Supine to Left Side: Independently PT Goal: Rolling Supine to Left Side - Progress: Goal set today Pt will go Supine/Side to Sit: Independently PT Goal: Supine/Side to Sit - Progress: Goal set today Pt will go Sit to Supine/Side: Independently PT Goal: Sit to Supine/Side - Progress: Goal set today Pt will go Sit to Stand: with supervision PT Goal: Sit to Stand - Progress: Goal set today Pt will go Stand to Sit: with modified independence PT Goal: Stand to Sit - Progress: Goal set today Pt will Ambulate: >150 feet;with supervision;with least restrictive assistive device PT Goal: Ambulate - Progress: Goal set today Pt will Go Up / Down Stairs: 1-2 stairs;with least restrictive assistive device;with supervision PT Goal: Up/Down Stairs - Progress: Goal set today  PT Evaluation Precautions/Restrictions  Precautions Precautions: Back Precaution Comments: Pt educated on 3/3 back precautions. Required Braces or Orthoses: Yes Spinal Brace: Lumbar corset;Applied in supine position (secondary to fractures received during surgery) Restrictions Weight Bearing Restrictions: No Prior Functioning  Home Living Lives With: Spouse Type of Home: House Home Layout: One level Home Access: Stairs to enter Entrance Stairs-Rails: Can reach both Entrance Stairs-Number of Steps: 2  Bathroom Shower/Tub: Gaffer;Door [employer] Toilet: Standard Home Adaptive Equipment: Walker - rolling Additional Comments: Wife reporting she will bring in walker to make sure it will be tall enough for pt.  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  Cognition Cognition Arousal/Alertness: Awake/alert Overall Cognitive Status: Appears within functional limits for tasks assessed Orientation Level: Oriented X4 Sensation/Coordination Sensation Light Touch: Appears Intact Coordination Gross Motor Movements are Fluid and Coordinated: Yes Fine Motor Movements are Fluid and Coordinated: Yes Extremity Assessment RUE Assessment RUE Assessment: Within Functional Limits LUE Assessment LUE Assessment: Within Functional Limits RLE Assessment RLE Assessment: Within Functional Limits LLE Assessment LLE Assessment: Within Functional Limits Mobility (including Balance) Bed Mobility Bed Mobility: No (up with RN upon arrival) Transfers Transfers:  (RN reports min assist to stand) Stand to Sit: 4: Min assist;To chair/3-in-1;With armrests;With upper extremity assist Stand to Sit Details: VC for sequencing and safe hand placement Ambulation/Gait Ambulation/Gait: Yes Ambulation/Gait Assistance: Other (comment) (min-guard) Ambulation/Gait Assistance Details (indicate cue type and reason): Verbal cues for negotiation of RW, upright posture. Cues to monitor symptoms. Ambulation Distance (Feet): 80 Feet Assistive device: Rolling walker Gait Pattern: Step-through pattern;Trunk flexed;Decreased stride length Stairs: No    End of Session PT - End of Session Equipment Utilized During Treatment: Gait belt;Back brace Activity Tolerance: Patient tolerated treatment well;Patient limited by fatigue Patient left: in chair;with call bell in reach;with family/visitor present Nurse Communication: Mobility status for transfers;Mobility status for ambulation General Behavior During Session: [facility] for tasks performed Cognition: [facility] for tasks performed  Lahoma Rocker 04/22/2011, 5:03  PM  Orlean Bradford) Oswaldo Conroy PT, DPT Acute Rehabilitation [phone]

## 2011-04-22 NOTE — Progress Notes (Signed)
Pt was able to get up and walk to the bathroom and then sat in the chair for about 15 minutes, then patient returned to the bed.  Upon laying down in bed, Pt's HR is now in the 130s-145s bpm.  Pt's HR today throughout the day has been in in 90s and low 100s at times.  Pt denies any dizziness, lightheadedness, shortness of breath, chest pain, and states his pain is a 2.  MD Christella Noa) notified,  Will continue to evaluate the patient.

## 2011-04-22 NOTE — Progress Notes (Signed)
Occupational Therapy Evaluation Patient Details Name: Justin [name] MRN: [id number] DOB: 1923-03-12 Today's Date: 04/22/2011  Problem List:  Patient Active Problem List  Diagnoses  . Abdominal aneurysm without mention of rupture    Past Medical History:  Past Medical History  Diagnosis Date  . Hiatal hernia   . AAA (abdominal aortic aneurysm)     "we've known about it since ~ 2000"  . Hypertension     Takes lisinopril/hctz  . Bronchitis     hx of; "once or twice in my life" (04/21/11)  . Arthritis     in back and hands  . GERD (gastroesophageal reflux disease)   . Stomach ulcer     "years ago; treated w/diet" (04/21/11  . Migraine     "left ~ 1980's"  . Chronic lower back pain 1995  . Gout of big toe     "h/o in both"   Past Surgical History:  Past Surgical History  Procedure Date  . Prostate surgery   . Eye surgery   . Anterior lat lumbar fusion 04/21/11    w/PEEK cage, allograft, and lateral plate (r)  . Tonsillectomy     "as a child"  . Appendectomy 1946  . Back surgery 04/21/11    "today makes the 5th time"  . Cataract extraction, bilateral     OT Assessment/Plan/Recommendation OT Assessment Clinical Impression Statement: Pt s/p anterior lateral lumbar fusion thus affecting PLOF. Will benefit from acute OT services to address below problem list in prep for d/c home with wife. OT Recommendation/Assessment: Patient will need skilled OT in the acute care venue OT Problem List: Decreased activity tolerance;Pain;Decreased knowledge of use of DME or AE;Decreased knowledge of precautions OT Therapy Diagnosis : Generalized weakness;Acute pain OT Plan OT Frequency: Min 2X/week OT Treatment/Interventions: Self-care/ADL training;DME and/or AE instruction;Therapeutic activities;Patient/family education OT Recommendation Follow Up Recommendations: Home health OT Equipment Recommended: 3 in 1 bedside comode Individuals Consulted Consulted and Agree with Results and  Recommendations: Patient OT Goals Acute Rehab OT Goals OT Goal Formulation: With patient Time For Goal Achievement: 7 days ADL Goals Pt Will Perform Grooming: with modified independence;Standing at sink ADL Goal: Grooming - Progress: Goal set today Pt Will Perform Lower Body Bathing: with modified independence;Sit to stand from chair;Sit to stand from bed;with adaptive equipment ADL Goal: Lower Body Bathing - Progress: Goal set today Pt Will Perform Lower Body Dressing: with modified independence;Sit to stand from bed;Sit to stand from chair;with adaptive equipment ADL Goal: Lower Body Dressing - Progress: Goal set today Pt Will Transfer to Toilet: with modified independence;with DME;3-in-1;Ambulation ADL Goal: Toilet Transfer - Progress: Goal set today Pt Will Perform Tub/Shower Transfer: Shower transfer;Ambulation;with DME;with modified independence ADL Goal: Tub/Shower Transfer - Progress: Goal set today Miscellaneous OT Goals Miscellaneous OT Goal #1: Pt will don/doff back brace supine in bed with caregiver independent in assisting.  OT Goal: Miscellaneous Goal #1 - Progress: Goal set today  OT Evaluation Precautions/Restrictions  Precautions Precautions: Back Precaution Comments: Pt educated on 3/3 back precautions. Restrictions Weight Bearing Restrictions: No Prior Functioning Home Living Lives With: Spouse Type of Home: House Home Layout: One level Home Access: Stairs to enter Entrance Stairs-Rails: Can reach both Entrance Stairs-Number of Steps: 2 Bathroom Shower/Tub: Walk-in shower;Door [employer] Toilet: Standard Home Adaptive Equipment: Walker - rolling Additional Comments: Wife reporting she will bring in walker to make sure it will be tall enough for pt.  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  ADL ADL Grooming: Simulated;Set up Where  Assessed - Grooming: Sitting, chair Lower Body Bathing: Simulated;Minimal assistance Lower Body Bathing Details (indicate cue type and reason): VC for technique Where Assessed - Lower Body Bathing: Sitting, chair Lower Body Dressing: Simulated;Minimal assistance Lower Body Dressing Details (indicate cue type and reason): VC for technique Where Assessed - Lower Body Dressing: Sitting, chair Toilet Transfer: Simulated;Minimal assistance Toilet Transfer Details (indicate cue type and reason): bed to chair Toilet Transfer Method: Ambulating Toilet Transfer Equipment: Other (comment) (chair) Toileting - Hygiene: Performed;Set up [place] Details (indicate cue type and reason): setup assist to retrieve toilet paper as pt unable to reach dispenser without bending Where Assessed - Toileting Hygiene: Standing Equipment Used: Rolling walker Ambulation Related to ADLs: VC to adhere to back precautions ADL Comments: Pt stood at toilet to void with close supervision for safety.  Pt up with RN upon OT arrival due to urgency to void.  Pt required +1 total assist to don brace supine in bed per RN report. Pt performed sit to stand with min assist with RN per RN report. Vision/Perception    Cognition Cognition Arousal/Alertness: Awake/alert Overall Cognitive Status: Appears within functional limits for tasks assessed Orientation Level: Oriented X4 Sensation/Coordination Coordination Gross Motor Movements are Fluid and Coordinated: Yes Fine Motor Movements are Fluid and Coordinated: Yes Extremity Assessment RUE Assessment RUE Assessment: Within Functional Limits LUE Assessment LUE Assessment: Within Functional Limits Mobility  Bed Mobility Bed Mobility: No (up with RN upon arrival) Transfers Transfers: Yes Stand to Sit: 4: Min assist;To chair/3-in-1;With armrests;With upper extremity assist Stand to Sit Details: VC for sequencing and safe hand placement. Exercises   End of  Session OT - End of Session Equipment Utilized During Treatment: Gait belt;Back brace Activity Tolerance: Patient limited by fatigue Patient left: in chair;with call bell in reach;with family/visitor present Nurse Communication: Mobility status for transfers General Behavior During Session: [facility] for tasks performed Cognition: [facility] for tasks performed   4:26 PM  04/22/2011 Darrol Jump OTR/L Pager [phone] Office [phone]

## 2011-04-22 NOTE — Consult Note (Signed)
CARDIOLOGY CONSULT NOTE  Patient ID: Justin [name] MRN: [id number], DOB/AGE: 05-24-1923   Admit date: 04/21/2011 Date of Consult: 04/22/2011   Primary Physician: Geoffery Lyons, MD, MD Primary Cardiologist: D. Magnum Lunde, MD  Pt. Profile  78 y/o male w/o prior cardiac hx whom we've been asked to eval 2/2 afib rvr.  Problem List  Past Medical History  Diagnosis Date  . Hiatal hernia   . AAA (abdominal aortic aneurysm)     "we've known about it since ~ 2000"  . Hypertension     Takes lisinopril/hctz  . Bronchitis     hx of; "once or twice in my life" (04/21/11)  . Arthritis     in back and hands  . GERD (gastroesophageal reflux disease)   . Stomach ulcer     "years ago; treated w/diet" (04/21/11  . Migraine     "left ~ 1980's"  . Chronic lower back pain 1995  . Gout of big toe     "h/o in both"  . Atrial fibrillation     a. 04/2011 post op  . Lumbar stenosis     a. s/p  anterolateral decompression and lateral plating 04/21/2011  . Lumbar spondylosis     Past Surgical History  Procedure Date  . Prostate surgery   . Eye surgery   . Anterior lat lumbar fusion 04/21/11    w/PEEK cage, allograft, and lateral plate (r)  . Tonsillectomy     "as a child"  . Appendectomy 1946  . Back surgery 04/21/11    "today makes the 5th time"  . Cataract extraction, bilateral      Allergies  Allergies  Allergen Reactions  . Demerol Anaphylaxis    HPI   78 y/o male with above problem list who is w/o prior cardiac hx.  He has chronic low back pain and yesterday was admitted for elective decompression and plating.  Pt tolerated procedure well and post procedure has been stable but reports fairly significant lbp.  This pm, he was trying to stay out of bed after eating dinner, and he was noted on pulse-ox to be tachycardic.  ECG was performed showing rapid afib.  Rapid response was called and we were asked to eval.  Pt is asymptomatic from cardiac standpoint, denying c/p, sob, or  palps.  His ECG shows no signif ST changes.  Inpatient Medications    . amiodarone  300 mg Intravenous Once  .  ceFAZolin (ANCEF) IV  1 g Intravenous Q8H  . dextrose 5 % and 0.45 % NaCl with KCl 20 mEq/L      . docusate sodium  100 mg Oral BID  . dorzolamide-timolol  1 drop Both Eyes BID  . hydrochlorothiazide  12.5 mg Oral Daily  . HYDROmorphone      . lisinopril  20 mg Oral Daily  . metoprolol  5 mg Intravenous Once  . morphine   Intravenous Q4H  . morphine      . sodium chloride  3 mL Intravenous Q12H    Family History Family History  Problem Relation Age of Onset  . Alzheimer's disease Mother     died @ 71  . COPD Father     died @ 40  . Stroke Sister   . Cancer Brother     stomach  . Anesthesia problems Neg Hx   . Hypotension Neg Hx   . Malignant hyperthermia Neg Hx   . Pseudochol deficiency Neg Hx      Social History  History   Social History  . Marital Status: Married    Spouse Name: N/A    Number of Children: N/A  . Years of Education: N/A   Occupational History  . Not on file.   Social History Main Topics  . Smoking status: Former Smoker -- 1.0 packs/day for 20 years    Types: Cigarettes    Quit date: 02/10/1980  . Smokeless tobacco: Never Used  . Alcohol Use: 0.0 oz/week     04/21/11 'bottle of wine/year"  . Drug Use: No  . Sexually Active: Yes   Other Topics Concern  . Not on file   Social History Narrative   [place] in [place] his [place].  Retired.  Activity limited by back pain.     Review of Systems  General:  No chills, fever, night sweats or weight changes.  Cardiovascular:  No chest pain, dyspnea on exertion, edema, orthopnea, palpitations, paroxysmal nocturnal dyspnea. Dermatological: No rash, lesions/masses Respiratory: No cough, dyspnea Urologic: No hematuria, dysuria Abdominal:   No nausea, vomiting, diarrhea, bright red blood per rectum, melena, or hematemesis Neurologic:  No visual changes, wkns, changes in mental  status. MSK:  C/o low back pain. All other systems reviewed and are otherwise negative except as noted above.  Physical Exam  Blood pressure 131/81, pulse 145, temperature 99.3 F (37.4 C), temperature source Oral, resp. rate 18, height 5\' 9"  (1.753 m), weight 179 lb 7.3 oz (81.4 kg), SpO2 96.00%.  General: Pleasant, NAD Psych: Normal affect. Neuro: Alert and oriented X 3. Moves all extremities spontaneously. HEENT: Normal  Neck: Supple without bruits or JVD. Lungs:  Resp regular and unlabored, CTA. Heart: irreg, irreg, tachy.  no s3, s4, or murmurs. Abdomen: Soft, non-tender, non-distended, BS + x 4.  Extremities: No clubbing, cyanosis or edema. DP/PT/Radials 2+ and equal bilaterally.  Labs  none  Radiology/Studies  Dg Lumbar Spine 2-3 Views  04/21/2011  *RADIOLOGY REPORT*  Clinical Data: Lumbar fusion  LUMBAR SPINE - 2-3 VIEW  Comparison: 03/04/2011  Findings: Three fluoroscopic spot intraoperative radiographs document changes of instrumented right   anterolateral and interbody fusion L3-4. Previously placed fixation hardware L4-S1 is noted.  IMPRESSION:  1.  Anterolateral and interbody fusion L3-4  Original Report Authenticated By: Dillard Cannon III, M.D.    ECG  afib 152, no acute changes.  ASSESSMENT AND PLAN  1.  afib w/ rvr:  New onset this evening in setting of recent surgery and ongoing lbp.  Pt is asymptomatic.  Will load with amio and place on gtt.  Hold off on anticoagulation for now given recent surgery.  Hopefully he will convert following amio.  Check lytes, echo, tsh.  If he doesn't convert on amio tonight, will need to reconsider anticoagulation and possibly dccv.  CHADS2: 2 (age, htn).  2.  S/p lumbar surgery:  Per surgery.  3.  HTN:  Stable.   Signed, Murray Hodgkins, NP 04/22/2011, 7:37 PM   Patient seen and examined independently. Emeline Gins, NP note reviewed carefully - agree with his assessment and plan. I have edited the note based on my  findings.   78 y/o active, independent male with no cardiac history now with AF with RVR in post-operative period. Asymptomatic. ECG with no with no ST depression despite rate of 150. No HF orr distress on exam. Will try to convert with short course of IV amio. If converts quickly can stop amio prior to d/c. No anti-coag at this time due to recent spine  surgery. D/w with patient and his family. Will aslo check echo.  Demontae Antunes,MD 11:59 PM

## 2011-04-22 NOTE — Progress Notes (Signed)
Subjective: Patient reports "I feel more sore than I expected, but better this morning."  Objective: Vital signs in last 24 hours: Temp:  [97.9 F (36.6 C)-98.6 F (37 C)] 98.6 F (37 C) (03/13 0950) Pulse Rate:  [83-110] 88  (03/13 0950) Resp:  [12-22] 18  (03/13 0950) BP: (115-167)/(63-83) 125/63 mmHg (03/13 0950) SpO2:  [96 %-100 %] 96 % (03/13 0950) Weight:  [81.4 kg (179 lb 7.3 oz)] 81.4 kg (179 lb 7.3 oz) (03/12 1858)  Intake/Output from previous day: 03/12 0701 - 03/13 0700 In: 2040 [P.O.:240; I.V.:1800] Out: 775 [Urine:775] Intake/Output this shift: Total I/O In: -  Out: 1700 [Urine:1700]  Alert, conversant. Abdomen remains distended (less than last night per pt). BS active x4. No abdominal pain per patient, only right groin soreness.  Incisions without erythema, swelling, or drainage. Dermabond intact. Good strength BLE.   Lab Results: No results found for this basename: WBC:2,HGB:2,HCT:2,PLT:2 in the last 72 hours BMET No results found for this basename: NA:2,K:2,CL:2,CO2:2,GLUCOSE:2,BUN:2,CREATININE:2,CALCIUM:2 in the last 72 hours  Studies/Results: Dg Lumbar Spine 2-3 Views  04/21/2011  *RADIOLOGY REPORT*  Clinical Data: Lumbar fusion  LUMBAR SPINE - 2-3 VIEW  Comparison: 03/04/2011  Findings: Three fluoroscopic spot intraoperative radiographs document changes of instrumented right   anterolateral and interbody fusion L3-4. Previously placed fixation hardware L4-S1 is noted.  IMPRESSION:  1.  Anterolateral and interbody fusion L3-4  Original Report Authenticated By: Trecia Rogers, M.D.    Assessment/Plan: Improving   LOS: 1 day  Mobilize in LSO with PT.    Verdis Prime 04/22/2011, 10:16 AM (Late entry: visited pt at 6)

## 2011-04-22 NOTE — Significant Event (Signed)
Rapid Response Event Note  Overview: Called by patient's primary nurse to assist with patient with increased heart rate post ambulation. Time Called: 1900 Arrival Time: 1910 Event Type: Cardiac (new onset afib w/ RVR)  Initial Focused Assessment: Upon arrival to department, patient lying in bed in no acute distress with family at bedside. Heart rate 130's-140's on telemetry monitor. Vital signs as noted in chart.    Interventions: Dr Christella Noa notified, new orders obtained. Spoke with Ignacia Bayley of [facility] Cardiology and provided patient information and made him aware of order for cardiology consult. Gerald Stabs arrived to room to further assess patient at 11.    Event Summary: Plans are for patient to be transferred to stepdown for closer monitoring; awaiting bed assignment.  Name of Physician Notified: Dr Christella Noa at [phone]    at  [facility] Cardiology telephoned at [facility], [place]

## 2011-04-22 NOTE — Progress Notes (Signed)
Patient transferred to 2920; placed on telemetry monitor. Care assumed by staff on 2900. Patient's family updated on current plan of care.

## 2011-04-23 ENCOUNTER — Encounter (HOSPITAL_COMMUNITY): Payer: Self-pay | Admitting: Physician Assistant

## 2011-04-23 DIAGNOSIS — I4891 Unspecified atrial fibrillation: Secondary | ICD-10-CM

## 2011-04-23 LAB — HEMOGLOBIN A1C: Hgb A1c MFr Bld: 5.9 % — ABNORMAL HIGH (ref <5.7)

## 2011-04-23 LAB — TSH: TSH: 4.189 u[IU]/mL (ref 0.350–4.500)

## 2011-04-23 MED ORDER — MAGNESIUM OXIDE 400 MG PO TABS
400.0000 mg | ORAL_TABLET | Freq: Once | ORAL | Status: AC
  Administered 2011-04-23: 400 mg via ORAL
  Filled 2011-04-23: qty 1

## 2011-04-23 MED ORDER — METOPROLOL TARTRATE 12.5 MG HALF TABLET
12.5000 mg | ORAL_TABLET | Freq: Four times a day (QID) | ORAL | Status: DC
  Administered 2011-04-23 – 2011-04-25 (×8): 12.5 mg via ORAL
  Filled 2011-04-23 (×13): qty 1

## 2011-04-23 NOTE — Progress Notes (Signed)
MORPHINE PCA D/CED, 9 ML WASTED AS WITNESSED BY Zacarias Pontes RN

## 2011-04-23 NOTE — Progress Notes (Signed)
Utilization review completed. Rozanna Boer, RN, BSN. 04/23/11

## 2011-04-23 NOTE — Progress Notes (Signed)
Physical Therapy Treatment Patient Details Name: Justin [name] MRN: [id number] DOB: Aug 01, 1923 Today's Date: 04/23/2011  PT Assessment/Plan  PT - Assessment/Plan Comments on Treatment Session: pt generally mobilized safely.  Reinforced back prec., better job at [employer], lifting prec., progression of activity PT Plan: Discharge plan remains appropriate Follow Up Recommendations: Home health PT Equipment Recommended: 3 in 1 bedside commode PT Goals  Acute Rehab PT Goals PT Goal: Rolling Supine to Right Side - Progress: Progressing toward goal PT Goal: Supine/Side to Sit - Progress: Progressing toward goal PT Goal: Sit to Supine/Side - Progress: Progressing toward goal PT Goal: Sit to Stand - Progress: Progressing toward goal PT Goal: Stand to Sit - Progress: Progressing toward goal PT Goal: Ambulate - Progress: Met  PT Treatment Precautions/Restrictions  Precautions Precautions: Back Precaution Booklet Issued: Yes (comment) Precaution Comments: Pt able to state 3/3 back precautions. Required Braces or Orthoses: Yes Spinal Brace: Lumbar corset;Applied in supine position Restrictions Weight Bearing Restrictions: No Mobility (including Balance) Bed Mobility Rolling Left: 5: Supervision Rolling Left Details (indicate cue type and reason): use of rail, but otherwise adequate job Left Sidelying to Sit: 5: Supervision Transfers Transfers: Yes Sit to Stand: From bed;Other (comment) (min guard A) Stand to Sit: To bed;5: Supervision Stand to Sit Details: vc's for hand placement Ambulation/Gait Ambulation/Gait: Yes Ambulation/Gait Assistance: 5: Supervision Ambulation/Gait Assistance Details (indicate cue type and reason): steady if a bit on the slow side Ambulation Distance (Feet): 220 Feet Assistive device: Rolling walker Gait Pattern: Step-through pattern;Decreased step length - right;Decreased step length - left;Decreased stride length Stairs: No  Balance Balance Assessed:  No Exercise    End of Session PT - End of Session Equipment Utilized During Treatment: Back brace Activity Tolerance: Patient tolerated treatment well Patient left: in chair;with call bell in reach Nurse Communication: Mobility status for transfers;Mobility status for ambulation General Behavior During Session: [name] for tasks performed Cognition: [facility] for tasks performed  Justin [name], Justin [name] 04/23/2011, 5:02 PM  04/23/2011  Donnella Sham, PT [phone] (pager) 04/23/2011  Donnella Sham, PT [phone] (pager)

## 2011-04-23 NOTE — Clinical Documentation Improvement (Signed)
Abnormal Labs Clarification  THIS DOCUMENT IS NOT A PERMANENT PART OF THE MEDICAL RECORD  TO RESPOND TO THE THIS QUERY, FOLLOW THE INSTRUCTIONS BELOW:  1. If needed, update documentation for the patient's encounter via the notes activity.  2. Access this query again and click edit on the [employer].  3. After updating, or not, click F2 to complete all highlighted (required) fields concerning your review. Select "additional documentation in the medical record" OR "no additional documentation provided".  4. Click Sign note button.  5. The deficiency will fall out of your InBasket *Please let us know if you are not able to complete this workflow by phone or e-mail (listed below).  Please update your documentation within the medical record to reflect your response to this query.                                                                                   04/23/11  Dear Dr. Percival Spanish Rolley Sims  In a better effort to capture your patient's severity of illness, reflect appropriate length of stay and utilization of resources, a review of the medical record has revealed the following indicators.   Based on your clinical judgment, please clarify and document in a progress note and/or discharge summary the clinical condition associated with the following supporting information: In responding to this query please exercise your independent judgment.  The fact that a query is asked, does not imply that any particular answer is desired or expected.  "Mg is slightly low at 1.8.Marland KitchenMarland Kitchenwill give MagOx 400mg  today" is documented in progress note 3/14. Please consider the below (if your clinical findings/judgment agree) as you document the patient's diagnosis/condition(s) in the progress note and discharge summary. Thank you!  Possible Clinical Conditions?  - Hypomagnesium  - Other condition (please document in the progress notes and/or discharge summary)  - Cannot Clinically determine at this  time     Reviewed:  no additional documentation provided.  The magnesium was actually within the normal range.   Thank You,  [place] Documentation Specialist: [phone] Pager  [place]

## 2011-04-23 NOTE — Progress Notes (Signed)
Occupational Therapy Treatment Patient Details Name: Justin [name] MRN: [id number] DOB: [birth year] Today's Date: 04/23/2011  OT Assessment/Plan OT Assessment/Plan Comments on Treatment Session: Pt will need HHOT and 3 in 1 at home. OT Plan: Discharge plan remains appropriate OT Frequency: Min 2X/week Follow Up Recommendations: Home health OT Equipment Recommended: 3 in 1 bedside comode;Other (comment) (wife to bring in walker for sizing) OT Goals Acute Rehab OT Goals OT Goal Formulation: With patient Time For Goal Achievement: 7 days ADL Goals Pt Will Perform Grooming: with modified independence;Standing at sink ADL Goal: Grooming - Progress: Progressing toward goals Pt Will Perform Lower Body Bathing: with modified independence;Sit to stand from chair;Sit to stand from bed;with adaptive equipment ADL Goal: Lower Body Bathing - Progress: Progressing toward goals Pt Will Perform Lower Body Dressing: with modified independence;Sit to stand from bed;Sit to stand from chair;with adaptive equipment ADL Goal: Lower Body Dressing - Progress: Progressing toward goals Pt Will Transfer to Toilet: with modified independence;with DME;3-in-1;Ambulation ADL Goal: Toilet Transfer - Progress: Progressing toward goals  OT Treatment Precautions/Restrictions  Precautions Precautions: Back;Fall Precaution Booklet Issued: Yes (comment) Precaution Comments: Pt able to state 3/3 back precautions. Required Braces or Orthoses: Yes Spinal Brace: Lumbar corset;Applied in supine position Restrictions Weight Bearing Restrictions: No   ADL ADL Grooming: Performed;Wash/dry face;Brushing hair Where Assessed - Grooming: Sitting, bed Lower Body Bathing: Simulated;Minimal assistance (instructed in use of AE) Where Assessed - Lower Body Bathing: Sitting, bed;Sit to stand from bed Lower Body Dressing: Performed;Minimal assistance (instructed in use of AE) Lower Body Dressing Details (indicate cue type and  reason): wife/pt aware they can buy AE in gift shop Where Assessed - Lower Body Dressing: Sitting, bed;Sit to stand from bed Toilet Transfer: Simulated;Minimal assistance (min guard) Toilet Transfer Details (indicate cue type and reason): bed to chair Toilet Transfer Method: Ambulating Equipment Used: Reacher;Sock aid;Long-handled sponge;Long-handled shoe horn ADL Comments: Pt and wife are knowledgeable in use of AE vs. alternative techniques for LB ADL.  Pt needs 3 in 1 for home--wife instructed to use over toilet and as shower seat.  Pt stood to urinate with supervision. Mobility  Bed Mobility Bed Mobility: Yes Rolling Left: 5: Supervision Left Sidelying to Sit: 5: Supervision Transfers Transfers: Yes Sit to Stand: 4: Min assist;With upper extremity assist;From bed;From chair/3-in-1 (min guard) Stand to Sit: 4: Min assist;To chair/3-in-1;With armrests;With upper extremity assist (min guard) Exercises    End of Session OT - End of Session Equipment Utilized During Treatment: Gait belt;Back brace Activity Tolerance: Patient tolerated treatment well Patient left: in chair;with call bell in reach;with family/visitor present Nurse Communication: Mobility status for transfers;Other (comment) (back precautions) General Behavior During Session: [facility] for tasks performed Cognition: [facility] for tasks performed  Malka So  04/23/2011, 9:52 AM [phone]

## 2011-04-23 NOTE — Progress Notes (Signed)
CSW received a consult for SNF. Currently, PT is recommending home health PT. RNCM is aware and will follow. CSW is signing off as no social work discharge plans identified. If discharge plan recommendations change or a need arises please reconsult.   Darden Dates, MSW, [place]

## 2011-04-23 NOTE — Progress Notes (Signed)
Subjective: Patient reports "The nurse said I was breathing fast...or my heart was too fast, I think."  Objective: Vital signs in last 24 hours: Temp:  [97.8 F (36.6 C)-99.3 F (37.4 C)] 98 F (36.7 C) (03/14 0406) Pulse Rate:  [76-145] 85  (03/14 0700) Resp:  [14-27] 15  (03/14 0755) BP: (96-138)/(53-95) 135/71 mmHg (03/14 0700) SpO2:  [93 %-100 %] 98 % (03/14 0755)  Intake/Output from previous day: 03/13 0701 - 03/14 0700 In: 497 [P.O.:240; I.V.:257] Out: 2275 [Urine:2275] Intake/Output this shift: Total I/O In: 16.7 [I.V.:16.7] Out: -   Awake, talkative, in good spirits. Remembers move to 2900 and increased heartrate episode with discussion. HR now stabillized on medication, managed by cardiology.  Good strength BLE. Incisions with Dermabond, without erythema, swelling, drainage. Abdomen now soft, round. BS active x4.  Mild right groin pain persists ith occasional left thigh pain with cough. Reassured. Pt willing to use po pain meds now.  Lab Results:  [facility] 04/22/11 2038  WBC 10.1  HGB 13.9  HCT 40.9  PLT 180   BMET  Basename 04/22/11 2038  NA 131*  K 4.1  CL 95*  CO2 25  GLUCOSE 171*  BUN 17  CREATININE 1.08  CALCIUM 9.1    Studies/Results: Dg Lumbar Spine 2-3 Views  04/21/2011  *RADIOLOGY REPORT*  Clinical Data: Lumbar fusion  LUMBAR SPINE - 2-3 VIEW  Comparison: 03/04/2011  Findings: Three fluoroscopic spot intraoperative radiographs document changes of instrumented right   anterolateral and interbody fusion L3-4. Previously placed fixation hardware L4-S1 is noted.  IMPRESSION:  1.  Anterolateral and interbody fusion L3-4  Original Report Authenticated By: Trecia Rogers, M.D.    Assessment/Plan: Stabilizing HR, managed by Cardiology. Thank you for your assistance!   LOS: 2 days  Continue to mobilize with PT as much as allowed by Cardiology.  Per Dr. Vertell Limber, will d/c PCA.   Verdis Prime 04/23/2011, 8:22 AM

## 2011-04-23 NOTE — Progress Notes (Signed)
Patient: Justin [name] Date of Encounter: 04/23/2011, 6:41 AM Admit date: 04/21/2011     Subjective  Feels well, no complaints. No CP, SOB or palpitations. Lying flat in bed without any difficulty.   Objective   Telemetry: NSR rates in the 70's (previously was afib) Physical Exam: Filed Vitals:   04/23/11 0500  BP: 131/58  Pulse: 77  Temp: 98*F  Resp: 17   General: Well developed, well nourished elderly WM in no acute distress who interacts/appears younger than stated age. Head: Normocephalic, atraumatic, sclera non-icteric, no xanthomas, nares are without discharge.  Neck: Negative for carotid bruits. JVD not elevated. Lungs: Clear bilaterally to auscultation without wheezes, rales, or rhonchi. Breathing is unlabored. Heart: RRR S1 S2 without murmurs, rubs, or gallops.  Abdomen: Soft, non-tender in upper/lower regions. Has abdominal brace in place so did not remove. No rebound/guarding.  Msk:  Strength and tone appear normal for age. Extremities: No clubbing or cyanosis. No edema.  Distal pedal pulses are 2+ and equal bilaterally. Neuro: Alert and oriented X 3. Moves all extremities spontaneously. Psych:  Responds to questions appropriately with a normal affect.    Intake/Output Summary (Last 24 hours) at 04/23/11 0641 Last data filed at 04/23/11 0500  Gross per 24 hour  Intake  480.3 ml  Output   2275 ml  Net -1794.7 ml    Labs:  [facility] 04/22/11 2038  NA 131*  K 4.1  CL 95*  CO2 25  GLUCOSE 171*  BUN 17  CREATININE 1.08  CALCIUM 9.1  MG 1.8  PHOS --    Basename 04/22/11 2038  WBC 10.1  NEUTROABS --  HGB 13.9  HCT 40.9  MCV 92.7  PLT 180    Basename 04/22/11 2038  TSH 4.189  T4TOTAL --  T3FREE --  THYROIDAB --   Radiology/Studies:  1. Lumbar Spine 2-3 Views 04/21/2011  *RADIOLOGY REPORT*  Clinical Data: Lumbar fusion  LUMBAR SPINE - 2-3 VIEW  Comparison: 03/04/2011  Findings: Three fluoroscopic spot intraoperative radiographs document changes  of instrumented right   anterolateral and interbody fusion L3-4. Previously placed fixation hardware L4-S1 is noted.  IMPRESSION:  1.  Anterolateral and interbody fusion L3-4  Original Report Authenticated By: Trecia Rogers, M.D.     Assessment and Plan   1. Post-op afib with RVR, converted to NSR, likely reactive to stress of surgery, was asymptomatic. TSH okay. Will obtain 12-lead EKG to confirm sinus but appears so on telemetry. Remains on amio gtt - MD to decide regarding transitioning off amio to PO, or possibly adding rate controlling agent instead. To complete CHADS2 assessment, 2D echo is pending & will also check HgbA1C given CBG 171. Not on anticoagulation for now given back surgery but may need consideration for such pending PT mobilization.   2. Lytes - Mg is slightly low at 1.8. Given slightly decreased GFR, will give MagOx 400mg  today. Check A1C given glucose 171. Na is also low at 131 but unsure if this is contributing to his afib -  may be secondary to HCTZ, consider discontinuing in lieu of agent with rate controlling properties to help address #1.   3. S/p lumbar surgery - per primary team.  Signed, Melina Copa PA-C  History reviewed with the patient, no changes to be made.  Now back in NSR. Denies pain or SOB.The patient exam reveals RRR, no rub, no edma.  All available labs, radiology testing, previous records reviewed. Agree with documented assessment and plan.  I  will stop the IV amiodarone and start beta blocker.  Echo pending. [name]  8:38 AM 12/26/2010

## 2011-04-24 DIAGNOSIS — I369 Nonrheumatic tricuspid valve disorder, unspecified: Secondary | ICD-10-CM

## 2011-04-24 LAB — CBC
MCH: 32 pg (ref 26.0–34.0)
MCHC: 34.6 g/dL (ref 30.0–36.0)
Platelets: 176 10*3/uL (ref 150–400)
RBC: 4.19 MIL/uL — ABNORMAL LOW (ref 4.22–5.81)
RDW: 13.3 % (ref 11.5–15.5)

## 2011-04-24 LAB — BASIC METABOLIC PANEL
CO2: 29 mEq/L (ref 19–32)
Calcium: 9.2 mg/dL (ref 8.4–10.5)
GFR calc Af Amer: 65 mL/min — ABNORMAL LOW (ref >90)
GFR calc non Af Amer: 56 mL/min — ABNORMAL LOW (ref >90)
Sodium: 137 mEq/L (ref 135–145)

## 2011-04-24 NOTE — Progress Notes (Signed)
Transfer to floor per Cardiology. Awaiting ECHO results.

## 2011-04-24 NOTE — Progress Notes (Signed)
*  PRELIMINARY RESULTS* Echocardiogram 2D Echocardiogram has been performed.  Justin [name] 04/24/2011, 10:13 AM

## 2011-04-24 NOTE — Progress Notes (Signed)
Subjective: Patient reports "I feel ok."  Objective: Vital signs in last 24 hours: Temp:  [98 F (36.7 C)-98.3 F (36.8 C)] 98.1 F (36.7 C) (03/15 0409) Pulse Rate:  [78-96] 84  (03/14 1512) Resp:  [16-19] 18  (03/15 0345) BP: (115-137)/(60-82) 126/64 mmHg (03/15 0345) SpO2:  [93 %-98 %] 93 % (03/15 0409)  Intake/Output from previous day: 03/14 0701 - 03/15 0700 In: 556.7 [P.O.:540; I.V.:16.7] Out: 1500 [Urine:1500] Intake/Output this shift:    Alert, conversant. Abd soft, nontender. Incisions with Dermabond. No erythema or swelling. Good strength BLE. HR regular without changes last 24hrs, now on beta blocker po.  Lab Results:  Basename 04/24/11 0500 04/22/11 2038  WBC 8.0 10.1  HGB 13.4 13.9  HCT 38.7* 40.9  PLT 176 180   BMET  Basename 04/24/11 0500 04/22/11 2038  NA 137 131*  K 4.0 4.1  CL 100 95*  CO2 29 25  GLUCOSE 119* 171*  BUN 23 17  CREATININE 1.15 1.08  CALCIUM 9.2 9.1    Studies/Results: No results found.  Assessment/Plan: Improving. Awaiting echo.   LOS: 3 days  Per Dr. Vertell Limber, mobilize with stand by assistance in LSO as much as permitted by cardiology.    Verdis Prime 04/24/2011, 8:06 AM

## 2011-04-24 NOTE — Progress Notes (Signed)
Report called to RN--will tx via w/c to 3017.

## 2011-04-24 NOTE — Progress Notes (Signed)
   SUBJECTIVE:  No palpitations or SOB.  Back pain.   PHYSICAL EXAM Filed Vitals:   04/23/11 2345 04/24/11 0008 04/24/11 0345 04/24/11 0409  BP: 118/60  126/64   Pulse:      Temp:  98.2 F (36.8 C)  98.1 F (36.7 C)  TempSrc:  Oral  Oral  Resp: 16  18   Height:      Weight:      SpO2:  96%  93%   General:  No distress Lungs:  Clear Heart:  RRR Abdomen:  Positive bowel sounds, no rebound no guarding Extremities:  No edema  LABS: No results found for this basename: CKTOTAL, CKMB, CKMBINDEX, TROPONINI   Results for orders placed during the hospital encounter of 04/21/11 (from the past 24 hour(s))  HEMOGLOBIN A1C     Status: Abnormal   Collection Time   04/23/11  9:39 AM      Component Value Range   Hemoglobin A1C 5.9 (*) <5.7 (%)   Mean Plasma Glucose 123 (*) <117 (mg/dL)  CBC     Status: Abnormal   Collection Time   04/24/11  5:00 AM      Component Value Range   WBC 8.0  4.0 - 10.5 (K/uL)   RBC 4.19 (*) 4.22 - 5.81 (MIL/uL)   Hemoglobin 13.4  13.0 - 17.0 (g/dL)   HCT 38.7 (*) 39.0 - 52.0 (%)   MCV 92.4  78.0 - 100.0 (fL)   MCH 32.0  26.0 - 34.0 (pg)   MCHC 34.6  30.0 - 36.0 (g/dL)   RDW 13.3  11.5 - 15.5 (%)   Platelets 176  150 - 400 (K/uL)  BASIC METABOLIC PANEL     Status: Abnormal   Collection Time   04/24/11  5:00 AM      Component Value Range   Sodium 137  135 - 145 (mEq/L)   Potassium 4.0  3.5 - 5.1 (mEq/L)   Chloride 100  96 - 112 (mEq/L)   CO2 29  19 - 32 (mEq/L)   Glucose, Bld 119 (*) 70 - 99 (mg/dL)   BUN 23  6 - 23 (mg/dL)   Creatinine, Ser 1.15  0.50 - 1.35 (mg/dL)   Calcium 9.2  8.4 - 10.5 (mg/dL)   GFR calc non Af Amer 56 (*) >90 (mL/min)   GFR calc Af Amer 65 (*) >90 (mL/min)    Intake/Output Summary (Last 24 hours) at 04/24/11 0808 Last data filed at 04/24/11 0300  Gross per 24 hour  Intake    540 ml  Output   1200 ml  Net   -660 ml    Telemetry:  NSR, no atrial fibrillation.  04/24/2011   ASSESSMENT AND PLAN:   Atrial  fibrillation:  NSR.  On low dose beta blocker.     I will continue this as once daily.  Ok to move be off telemetry and out of the ICU per the primary team.    [name] 04/24/2011 8:08 AM

## 2011-04-24 NOTE — Progress Notes (Signed)
Physical Therapy Treatment Patient Details Name: Justin [name] MRN: [id number] DOB: 1923-11-29 Today's Date: 04/24/2011  PT Assessment/Plan  PT - Assessment/Plan Comments on Treatment Session: reinforced back precautions and general care.  Pt about ready for D/C PT Plan: Discharge plan remains appropriate Follow Up Recommendations: Home health PT Equipment Recommended: 3 in 1 bedside commode PT Goals  Acute Rehab PT Goals PT Goal: Rolling Supine to Right Side - Progress: Progressing toward goal PT Goal: Rolling Supine to Left Side - Progress: Progressing toward goal PT Goal: Supine/Side to Sit - Progress: Progressing toward goal PT Goal: Sit to Supine/Side - Progress: Progressing toward goal PT Goal: Sit to Stand - Progress: Progressing toward goal PT Goal: Stand to Sit - Progress: Progressing toward goal PT Goal: Ambulate - Progress: Met PT Goal: Up/Down Stairs - Progress: Met  PT Treatment Precautions/Restrictions  Precautions Precautions: Back Precaution Booklet Issued: Yes (comment) Precaution Comments: Pt able to state 3/3 back precautions. Required Braces or Orthoses: Yes Spinal Brace: Lumbar corset;Applied in supine position Restrictions Weight Bearing Restrictions: No Mobility (including Balance) Bed Mobility Sit to Sidelying Left: 5: Supervision;HOB flat Sit to Sidelying Left Details (indicate cue type and reason): reinforced laying fully down as pulling legs into the bed. Transfers Sit to Stand: 6: Modified independent (Device/Increase time) Stand to Sit: 6: Modified independent (Device/Increase time) Ambulation/Gait Ambulation/Gait Assistance: 5: Supervision Ambulation/Gait Assistance Details (indicate cue type and reason): light use of the RW Ambulation Distance (Feet): 220 Feet Assistive device: Rolling walker Gait Pattern: Within Functional Limits Stairs: Yes Stairs Assistance: 5: Supervision Stairs Assistance Details (indicate cue type and reason): vc's  for technique to address pain in R hip/flank on steps ie step to pattern --up with good , down with painful leg first Stair Management Technique: One rail Left;Step to pattern;Forwards Number of Stairs: 8   Posture/Postural Control Posture/Postural Control: No significant limitations Balance Balance Assessed: No Exercise    End of Session PT - End of Session Equipment Utilized During Treatment: Back brace Activity Tolerance: Patient tolerated treatment well Patient left: in bed;with call bell in reach Nurse Communication: Mobility status for transfers;Mobility status for ambulation General Behavior During Session: [facility] for tasks performed Cognition: [name] for tasks performed  [name], [name] 04/24/2011, 1:54 PM  04/24/2011  Donnella Sham, PT [phone] (pager)

## 2011-04-25 MED ORDER — METOPROLOL TARTRATE 25 MG PO TABS
25.0000 mg | ORAL_TABLET | Freq: Two times a day (BID) | ORAL | Status: DC
  Administered 2011-04-25 – 2011-04-26 (×2): 25 mg via ORAL
  Filled 2011-04-25 (×3): qty 1

## 2011-04-25 NOTE — Progress Notes (Signed)
Patient ID: Justin [name], male   DOB: 1924-01-19, 78 y.o.   MRN: [id number] Stable. Transferred from icu yesterday. No back pain . waiting from the cardiologist to be sure he is stable to go home, as per dr Vertell Limber

## 2011-04-25 NOTE — Progress Notes (Signed)
Subjective:  Feels well and wants to go home.  Not SOB, no chest pain.  Objective:  Vital Signs in the last 24 hours: BP 117/77  Pulse 93  Temp(Src) 98.1 F (36.7 C) (Oral)  Resp 19  Ht 5\' 9"  (1.753 m)  Wt 81.4 kg (179 lb 7.3 oz)  BMI 26.50 kg/m2  SpO2 96%  Physical Exam: Talkative pleasant WM in NAD Lungs:  Clear Cardiac:  Regular rhythm, normal S1 and S2, no S3  Intake/Output from previous day: 03/15 0701 - 03/16 0700 In: 240 [P.O.:240] Out: 500 [Urine:500]  Lab Results: Basic Metabolic Panel:  Basename 04/24/11 0500 04/22/11 2038  NA 137 131*  K 4.0 4.1  CL 100 95*  CO2 29 25  GLUCOSE 119* 171*  BUN 23 17  CREATININE 1.15 1.08    CBC:  Basename 04/24/11 0500 04/22/11 2038  WBC 8.0 10.1  NEUTROABS -- --  HGB 13.4 13.9  HCT 38.7* 40.9  MCV 92.4 92.7  PLT 176 180   Telemetry: Off tele  Assessment/Plan:   1.  Stable from cardiac viewpoint.  OK to go home on low dose beta blocker and f/u 3 weeks.with [place].    Kerry Hough.  MD [facility] 04/25/2011, 11:55 AM

## 2011-04-26 MED ORDER — METOPROLOL TARTRATE 25 MG PO TABS: 25.0000 mg | ORAL_TABLET | Freq: Two times a day (BID) | ORAL | Status: DC

## 2011-04-26 MED ORDER — HYDROCODONE-ACETAMINOPHEN 5-325 MG PO TABS: 1.0000 | ORAL_TABLET | ORAL | Status: AC | PRN

## 2011-04-26 NOTE — Progress Notes (Signed)
I saw patient yesterday and cleared him for discharge.  Patient still in hospital.  No active cardiac problems.  Ok to go home from cardiac viewpoint.  Will not see again in house unless requested.  Please d/con metoprolol 25 BID and f/u with [place] in 3 weeks.    Kerry Hough MD [facility]

## 2011-04-26 NOTE — Discharge Summary (Signed)
Physician Discharge Summary  Patient ID: [name] MRN: [id number] DOB/AGE: 12-Aug-1923 78 y.o.  Admit date: 04/21/2011 Discharge date: 04/26/2011  Admission Diagnoses: Lumbar spondylolisthesis, lumbar spondylosis, lumbar stenosis  Discharge Diagnoses: Lumbar spondylolisthesis, lumbar spondylosis, lumbar stenosis, atrial fibrillation Active Problems:  Atrial fibrillation   Discharged Condition: good  Hospital Course: Patient was admitted by Dr. Vertell Limber and he performed a  L3-4 XLIF. Postoperatively patient did well from a neurosurgical perspective but apparently developed atrial fibrillation. He was seen in cardiology consultation by Dr. Glori Bickers from [facility] cardiology and treated. At this point the atrial fibrillation and stabilized, he is taking Lopressor 25 mg twice a day and he's been cleared by cardiology for discharge. From a neurosurgical perspective he's been seen by PT and OT and is up and ambulating well with a rolling walker. He's been given instructions regarding wound care and activities following discharge and is to return to see Dr. Haroldine Laws and Dr. Vertell Limber, both in about 2 weeks. Wounds are clean dry.  Consults: cardiology  Discharge Exam: Blood pressure 116/73, pulse 87, temperature 98 F (36.7 C), temperature source Oral, resp. rate 17, height 5\' 9"  (1.753 m), weight 81.4 kg (179 lb 7.3 oz), SpO2 95.00%.  Disposition: Home  Discharge Orders    Future Appointments: Provider: Department: Dept Phone: Center:   02/29/2012 9:00 AM [facility] [phone] [facility]   02/29/2012 9:45 AM Serafina Mitchell, MD [facility] [phone] [facility]     Medication List  As of 04/26/2011 12:39 PM   TAKE these medications         aspirin EC 81 MG tablet   Take 81 mg by mouth daily.      dorzolamide-timolol 22.3-6.8 MG/ML ophthalmic solution   Commonly known as: COSOPT   Place 1 drop into both eyes 2 (two) times daily.      HYDROcodone-acetaminophen 5-325 MG per  tablet   Commonly known as: NORCO   Take 1-2 tablets by mouth every 4 (four) hours as needed for pain.      lisinopril-hydrochlorothiazide 20-12.5 MG per tablet   Commonly known as: PRINZIDE,ZESTORETIC   Take 1 tablet by mouth daily.      metoprolol tartrate 25 MG tablet   Commonly known as: LOPRESSOR   Take 1 tablet (25 mg total) by mouth 2 (two) times daily.           Follow-up Information    Follow up with Glori Bickers, MD in 1 month.   Contact information:   [address] [place] [phone]          Signed: Hosie Spangle, MD 04/26/2011, 12:39 PM

## 2011-04-27 ENCOUNTER — Encounter (HOSPITAL_COMMUNITY): Payer: Self-pay | Admitting: Neurosurgery

## 2011-05-18 DIAGNOSIS — M545 Low back pain: Secondary | ICD-10-CM | POA: Diagnosis not present

## 2011-05-25 ENCOUNTER — Ambulatory Visit (HOSPITAL_COMMUNITY)
Admission: RE | Admit: 2011-05-25 | Discharge: 2011-05-25 | Disposition: A | Discharge status: Home / Self Care | Payer: Medicare Other | Source: Ambulatory Visit | Attending: Internal Medicine | Admitting: Internal Medicine

## 2011-05-25 VITALS — BP 122/62 | HR 72 | Wt 164.2 lb

## 2011-05-25 DIAGNOSIS — I4891 Unspecified atrial fibrillation: Secondary | ICD-10-CM | POA: Insufficient documentation

## 2011-05-25 NOTE — Progress Notes (Signed)
  HPI: Justin [name] is a 78 y.o. gentlemen with history of hypertension and recent L3-4 XLIF with post op AF with RVR.  He converted to SR on short course of IV amio, no anti-coag.  Started on lopressor 25 mg BID.    Echo 04/24/11: LV is hyperdynamic with near cavity obliteration.  LVEF 65-70%  He returns for post hospital follow up.  He feels increased fatigue since surgery.  Increased tiredness at 100-200 ft.  No SOB/orthopnea/PND.  No palpitations.  No chest pain.  No fever/chills.  Walking several times a day.  No palpitations. Taking his BP and HR regularly and HR almost always 70-80s.   ROS: All systems negative except as listed in HPI, PMH and Problem List.  Past Medical History  Diagnosis Date  . Hiatal hernia   . AAA (abdominal aortic aneurysm)     "we've known about it since ~ 2000"  . Hypertension     Takes lisinopril/hctz  . Bronchitis     hx of; "once or twice in my life" (04/21/11)  . Arthritis     in back and hands  . GERD (gastroesophageal reflux disease)   . Stomach ulcer     "years ago; treated w/diet"   . Migraine     "left ~ 1980's"  . Chronic lower back pain 1995  . Gout of big toe     "h/o in both"  . Atrial fibrillation     a. 04/2011 post op  . Lumbar stenosis     a. s/p  anterolateral decompression and lateral plating 04/21/2011  . Lumbar spondylosis     Current Outpatient Prescriptions  Medication Sig Dispense Refill  . aspirin EC 81 MG tablet Take 81 mg by mouth daily.       . dorzolamide-timolol (COSOPT) 22.3-6.8 MG/ML ophthalmic solution Place 1 drop into both eyes 2 (two) times daily.       Marland Kitchen dutasteride (AVODART) 0.5 MG capsule Take 0.5 mg by mouth 2 (two) times a week.      Marland Kitchen lisinopril-hydrochlorothiazide (PRINZIDE,ZESTORETIC) 20-12.5 MG per tablet Take 1 tablet by mouth daily.      . metoprolol tartrate (LOPRESSOR) 25 MG tablet Take 1 tablet (25 mg total) by mouth 2 (two) times daily.  60 tablet  0     PHYSICAL EXAM: Filed Vitals:   05/25/11  1013  BP: 122/62  Pulse: 72  Weight: 164 lb 4 oz (74.503 kg)  SpO2: 96%    General:  Well appearing. No resp difficulty HEENT: normal Neck: supple. JVP flat. Carotids 2+ bilaterally; no bruits. No lymphadenopathy or thryomegaly appreciated. Cor: PMI normal. Regular rate & rhythm. No rubs, gallops or murmurs. Lungs: clear Abdomen: soft, nontender, nondistended. No hepatosplenomegaly. No bruits or masses. Good bowel sounds. Extremities: no cyanosis, clubbing, rash, edema Neuro: alert & orientedx3, cranial nerves grossly intact. Moves all 4 extremities w/o difficulty. Affect pleasant.   ECG: NSR at 69 bpm No ST-T wave abnormalities.     ASSESSMENT & PLAN:

## 2011-05-25 NOTE — Assessment & Plan Note (Signed)
This was just post-op AF. Now in NSR. No evidence of recurrence. Given fatigue will stop metoprolol. He will follow on his monitor at home and call us if he needs [place].

## 2011-05-27 NOTE — Progress Notes (Signed)
Encounter addended by: Asencion Gowda on: 05/27/2011  7:52 AM<BR>     Documentation filed: Charges VN

## 2011-06-04 ENCOUNTER — Telehealth (HOSPITAL_COMMUNITY): Payer: Self-pay | Admitting: Vascular Surgery

## 2011-06-04 NOTE — Telephone Encounter (Signed)
Spoke w/pt he states he has noticed his HR being elevated running 90-110s no complaints will let Dr Haroldine Laws know and call pt back

## 2011-06-04 NOTE — Telephone Encounter (Signed)
Per Dr Haroldine Laws pt needs to come for an EKG, he will come tomorrow at 10 AM

## 2011-06-04 NOTE — Telephone Encounter (Signed)
Mrs. Mogren called her husband heart rate is up . She believes its due to medication. Please call her back

## 2011-06-05 ENCOUNTER — Ambulatory Visit (HOSPITAL_COMMUNITY)
Admission: RE | Admit: 2011-06-05 | Discharge: 2011-06-05 | Disposition: A | Discharge status: Home / Self Care | Payer: Medicare Other | Source: Ambulatory Visit | Attending: Internal Medicine | Admitting: Internal Medicine

## 2011-06-05 VITALS — BP 140/76 | HR 80 | Wt 165.5 lb

## 2011-06-05 DIAGNOSIS — I4891 Unspecified atrial fibrillation: Secondary | ICD-10-CM

## 2011-06-05 MED ORDER — METOPROLOL TARTRATE 25 MG PO TABS: 25.0000 mg | ORAL_TABLET | Freq: Two times a day (BID) | ORAL | Status: DC

## 2011-06-05 NOTE — Progress Notes (Signed)
Patient in for EKG, revealed SR with HR 80, he is just concerned because at times it is up to 110s and he occasionally has times that he just doesn't feel that great however he is not sure if it could just be from surgery recovery.  He denies palpitations, CP and SOB.  Discussed w/Amy Ninfa Meeker, NP will put patient back on Metoprolol at 12.5 mg Twice daily he will keep a journal of how he feels and his HR will touch base with him next week.

## 2011-06-06 NOTE — Progress Notes (Signed)
Encounter addended by: Butch Penny on: 06/06/2011 10:11 AM<BR>     Documentation filed: Charges VN

## 2011-06-17 DIAGNOSIS — M545 Low back pain: Secondary | ICD-10-CM | POA: Diagnosis not present

## 2011-06-25 ENCOUNTER — Telehealth (HOSPITAL_COMMUNITY): Payer: Self-pay | Admitting: *Deleted

## 2011-06-25 NOTE — Telephone Encounter (Signed)
NA

## 2011-06-25 NOTE — Telephone Encounter (Signed)
Does not need antibiotics Left message to call back

## 2011-06-25 NOTE — Telephone Encounter (Signed)
Ms Kulzer called today in regards to Justin [name] medication. He needs to go to the dentist and is wanting to know if he will need to be pre medicated with antibiotics before the appt.  They are also curious as to when they should be seen again.

## 2011-06-26 NOTE — Telephone Encounter (Signed)
Left mess pt does not need antibiotics for dental work

## 2011-08-17 DIAGNOSIS — R7301 Impaired fasting glucose: Secondary | ICD-10-CM | POA: Diagnosis not present

## 2011-08-17 DIAGNOSIS — E785 Hyperlipidemia, unspecified: Secondary | ICD-10-CM | POA: Diagnosis not present

## 2011-08-17 DIAGNOSIS — Z125 Encounter for screening for malignant neoplasm of prostate: Secondary | ICD-10-CM | POA: Diagnosis not present

## 2011-08-17 DIAGNOSIS — I10 Essential (primary) hypertension: Secondary | ICD-10-CM | POA: Diagnosis not present

## 2011-08-17 DIAGNOSIS — M545 Low back pain: Secondary | ICD-10-CM | POA: Diagnosis not present

## 2011-08-17 DIAGNOSIS — E041 Nontoxic single thyroid nodule: Secondary | ICD-10-CM | POA: Diagnosis not present

## 2011-08-19 DIAGNOSIS — H4011X0 Primary open-angle glaucoma, stage unspecified: Secondary | ICD-10-CM | POA: Diagnosis not present

## 2011-08-21 DIAGNOSIS — L259 Unspecified contact dermatitis, unspecified cause: Secondary | ICD-10-CM | POA: Diagnosis not present

## 2011-08-24 DIAGNOSIS — I739 Peripheral vascular disease, unspecified: Secondary | ICD-10-CM | POA: Diagnosis not present

## 2011-08-24 DIAGNOSIS — E785 Hyperlipidemia, unspecified: Secondary | ICD-10-CM | POA: Diagnosis not present

## 2011-08-24 DIAGNOSIS — Z125 Encounter for screening for malignant neoplasm of prostate: Secondary | ICD-10-CM | POA: Diagnosis not present

## 2011-08-24 DIAGNOSIS — Z0000 Encounter for general adult medical examination without abnormal findings: Secondary | ICD-10-CM | POA: Diagnosis not present

## 2011-08-24 DIAGNOSIS — I10 Essential (primary) hypertension: Secondary | ICD-10-CM | POA: Diagnosis not present

## 2011-10-07 DIAGNOSIS — T6391XA Toxic effect of contact with unspecified venomous animal, accidental (unintentional), initial encounter: Secondary | ICD-10-CM | POA: Diagnosis not present

## 2011-10-07 DIAGNOSIS — L500 Allergic urticaria: Secondary | ICD-10-CM | POA: Diagnosis not present

## 2011-11-14 DIAGNOSIS — M109 Gout, unspecified: Secondary | ICD-10-CM | POA: Diagnosis not present

## 2011-11-20 DIAGNOSIS — H4011X0 Primary open-angle glaucoma, stage unspecified: Secondary | ICD-10-CM | POA: Diagnosis not present

## 2012-02-19 ENCOUNTER — Other Ambulatory Visit: Payer: Self-pay | Admitting: *Deleted

## 2012-02-19 DIAGNOSIS — H353190 Nonexudative age-related macular degeneration, unspecified eye, stage unspecified: Secondary | ICD-10-CM | POA: Diagnosis not present

## 2012-02-19 DIAGNOSIS — I714 Abdominal aortic aneurysm, without rupture: Secondary | ICD-10-CM

## 2012-02-24 DIAGNOSIS — Z1331 Encounter for screening for depression: Secondary | ICD-10-CM | POA: Diagnosis not present

## 2012-02-24 DIAGNOSIS — R7301 Impaired fasting glucose: Secondary | ICD-10-CM | POA: Diagnosis not present

## 2012-02-24 DIAGNOSIS — E785 Hyperlipidemia, unspecified: Secondary | ICD-10-CM | POA: Diagnosis not present

## 2012-02-24 DIAGNOSIS — I10 Essential (primary) hypertension: Secondary | ICD-10-CM | POA: Diagnosis not present

## 2012-02-26 ENCOUNTER — Encounter: Payer: Self-pay | Admitting: Neurosurgery

## 2012-02-29 ENCOUNTER — Encounter (INDEPENDENT_AMBULATORY_CARE_PROVIDER_SITE_OTHER): Payer: Medicare Other | Admitting: *Deleted

## 2012-02-29 ENCOUNTER — Ambulatory Visit (INDEPENDENT_AMBULATORY_CARE_PROVIDER_SITE_OTHER): Payer: Medicare Other | Admitting: Neurosurgery

## 2012-02-29 ENCOUNTER — Encounter: Payer: Self-pay | Admitting: Neurosurgery

## 2012-02-29 ENCOUNTER — Ambulatory Visit: Payer: Medicare Other | Admitting: Surgery

## 2012-02-29 VITALS — BP 139/81 | HR 76 | Resp 16 | Ht 69.0 in | Wt 162.5 lb

## 2012-02-29 DIAGNOSIS — I714 Abdominal aortic aneurysm, without rupture, unspecified: Secondary | ICD-10-CM

## 2012-02-29 NOTE — Addendum Note (Signed)
Addended by: Mena Goes on: 02/29/2012 03:14 PM   Modules accepted: Orders

## 2012-02-29 NOTE — Progress Notes (Signed)
[facility] AAA/Carotid Office Note  CC: AAA duplex Referring Physician: Brabham  History of Present Illness: 79 year old male patient of Dr. Trula Slade followed for known AAA. The patient denies any unusual abdominal or back pain, the patient also denies any new medical diagnoses or recent surgery.  Past Medical History  Diagnosis Date  . Hiatal hernia   . AAA (abdominal aortic aneurysm)     "we've known about it since ~ 2000"  . Hypertension     Takes lisinopril/hctz  . Bronchitis     hx of; "once or twice in my life" (04/21/11)  . Arthritis     in back and hands  . GERD (gastroesophageal reflux disease)   . Stomach ulcer     "years ago; treated w/diet"   . Migraine     "left ~ 1980's"  . Chronic lower back pain 1995  . Gout of big toe     "h/o in both"  . Atrial fibrillation     a. 04/2011 post op  . Lumbar stenosis     a. s/p  anterolateral decompression and lateral plating 04/21/2011  . Lumbar spondylosis     ROS: [x]  Positive   [ ]  Denies    General: [ ]  Weight loss, [ ]  Fever, [ ]  chills Neurologic: [ ]  Dizziness, [ ]  Blackouts, [ ]  Seizure [ ]  Stroke, [ ]  "Mini stroke", [ ]  Slurred speech, [ ]  Temporary blindness; [ ]  weakness in arms or legs, [ ]  Hoarseness Cardiac: [ ]  Chest pain/pressure, [ ]  Shortness of breath at rest [ ]  Shortness of breath with exertion, [ ]  Atrial fibrillation or irregular heartbeat Vascular: [ ]  Pain in legs with walking, [ ]  Pain in legs at rest, [ ]  Pain in legs at night,  [ ]  Non-healing ulcer, [ ]  Blood clot in vein/DVT,   Pulmonary: [ ]  Home oxygen, [ ]  Productive cough, [ ]  Coughing up blood, [ ]  Asthma,  [ ]  Wheezing Musculoskeletal:  [ ]  Arthritis, [ ]  Low back pain, [ ]  Joint pain Hematologic: [ ]  Easy Bruising, [ ]  Anemia; [ ]  Hepatitis Gastrointestinal: [ ]  Blood in stool, [ ]  Gastroesophageal Reflux/heartburn, [ ]  Trouble swallowing Urinary: [ ]  chronic Kidney disease, [ ]  on HD - [ ]  MWF or [ ]  TTHS,  [ ]  Burning with urination, [ ]  Difficulty urinating Skin: [ ]  Rashes, [ ]  Wounds Psychological: [ ]  Anxiety, [ ]  Depression   Social History History  Substance Use Topics  . Smoking status: Former Smoker -- 1.0 packs/day for 20 years    Types: Cigarettes    Quit date: 02/10/1980  . Smokeless tobacco: Never Used  . Alcohol Use: 0.0 oz/week     Comment: 04/21/11 'bottle of wine/year"    Family History Family History  Problem Relation Age of Onset  . Alzheimer's disease Mother     died @ 65  . COPD Father     died @ 33  . Stroke Sister   . Cancer Brother     stomach  . Anesthesia problems Neg Hx   . Hypotension Neg Hx   . Malignant hyperthermia Neg Hx   . Pseudochol deficiency Neg Hx     Allergies  Allergen Reactions  . Demerol Anaphylaxis    Current Outpatient Prescriptions  Medication Sig Dispense Refill  . aspirin EC 81 MG tablet Take 81 mg by mouth daily.       . dorzolamide-timolol (COSOPT) 22.3-6.8 MG/ML ophthalmic  solution Place 1 drop into both eyes 2 (two) times daily.       Marland Kitchen dutasteride (AVODART) 0.5 MG capsule Take 0.5 mg by mouth 2 (two) times a week.      Marland Kitchen lisinopril-hydrochlorothiazide (PRINZIDE,ZESTORETIC) 20-12.5 MG per tablet Take 1 tablet by mouth daily.      . metoprolol tartrate (LOPRESSOR) 25 MG tablet Take 1 tablet (25 mg total) by mouth 2 (two) times daily.  60 tablet  3    Physical Examination  Filed Vitals:   02/29/12 0944  BP: 139/81  Pulse: 76  Resp: 16    Body mass index is 24.00 kg/(m^2).  General:  WDWN in NAD Gait: Normal HEENT: WNL Eyes: Pupils equal Pulmonary: normal non-labored breathing , without Rales, rhonchi,  wheezing Cardiac: RRR, without  Murmurs, rubs or gallops; Abdomen: soft, NT, no masses Skin: no rashes, ulcers noted  Vascular Exam Pulses: 3+ radial pulses bilaterally, palpable femoral pulses bilaterally, I do not palpate a abdominal pulsatile mass Carotid bruits: Carotid pulses to auscultation no bruits  are Extremities without ischemic changes, no Gangrene , no cellulitis; no open wounds;  Musculoskeletal: no muscle wasting or atrophy   Neurologic: A&O X 3; Appropriate Affect ; SENSATION: normal; MOTOR FUNCTION:  moving all extremities equally. Speech is fluent/normal  Non-Invasive Vascular Imaging AAA duplex today shows a maximum diameter of 4.3 x 4.2 which is a slight increase from January 2013 when he was 3.8 x 3.8  ASSESSMENT/PLAN: Asymptomatic AAA that'll followup in one year with repeat duplex. The patient's questions were encouraged and answered, he is in agreement with this plan.  Beatris Ship ANP   Clinic MD: Trula Slade

## 2012-03-07 DIAGNOSIS — Z23 Encounter for immunization: Secondary | ICD-10-CM | POA: Diagnosis not present

## 2012-03-09 DIAGNOSIS — H40019 Open angle with borderline findings, low risk, unspecified eye: Secondary | ICD-10-CM | POA: Diagnosis not present

## 2012-03-14 DIAGNOSIS — J019 Acute sinusitis, unspecified: Secondary | ICD-10-CM | POA: Diagnosis not present

## 2012-03-14 DIAGNOSIS — R05 Cough: Secondary | ICD-10-CM | POA: Diagnosis not present

## 2012-03-14 DIAGNOSIS — R5381 Other malaise: Secondary | ICD-10-CM | POA: Diagnosis not present

## 2012-03-16 DIAGNOSIS — M545 Low back pain: Secondary | ICD-10-CM | POA: Diagnosis not present

## 2012-03-18 DIAGNOSIS — H4011X0 Primary open-angle glaucoma, stage unspecified: Secondary | ICD-10-CM | POA: Diagnosis not present

## 2012-05-11 DIAGNOSIS — H4011X0 Primary open-angle glaucoma, stage unspecified: Secondary | ICD-10-CM | POA: Diagnosis not present

## 2012-06-01 DIAGNOSIS — H4011X0 Primary open-angle glaucoma, stage unspecified: Secondary | ICD-10-CM | POA: Diagnosis not present

## 2012-07-11 DIAGNOSIS — L259 Unspecified contact dermatitis, unspecified cause: Secondary | ICD-10-CM | POA: Diagnosis not present

## 2012-07-11 DIAGNOSIS — T148 Other injury of unspecified body region: Secondary | ICD-10-CM | POA: Diagnosis not present

## 2012-07-12 DIAGNOSIS — H4011X0 Primary open-angle glaucoma, stage unspecified: Secondary | ICD-10-CM | POA: Diagnosis not present

## 2012-07-26 DIAGNOSIS — L259 Unspecified contact dermatitis, unspecified cause: Secondary | ICD-10-CM | POA: Diagnosis not present

## 2012-07-29 DIAGNOSIS — R5381 Other malaise: Secondary | ICD-10-CM | POA: Diagnosis not present

## 2012-07-29 DIAGNOSIS — R5383 Other fatigue: Secondary | ICD-10-CM | POA: Diagnosis not present

## 2012-07-29 DIAGNOSIS — IMO0002 Reserved for concepts with insufficient information to code with codable children: Secondary | ICD-10-CM | POA: Diagnosis not present

## 2012-08-03 DIAGNOSIS — H4011X0 Primary open-angle glaucoma, stage unspecified: Secondary | ICD-10-CM | POA: Diagnosis not present

## 2012-08-17 DIAGNOSIS — R5383 Other fatigue: Secondary | ICD-10-CM | POA: Diagnosis not present

## 2012-08-17 DIAGNOSIS — I10 Essential (primary) hypertension: Secondary | ICD-10-CM | POA: Diagnosis not present

## 2012-08-17 DIAGNOSIS — E785 Hyperlipidemia, unspecified: Secondary | ICD-10-CM | POA: Diagnosis not present

## 2012-08-17 DIAGNOSIS — Z125 Encounter for screening for malignant neoplasm of prostate: Secondary | ICD-10-CM | POA: Diagnosis not present

## 2012-08-17 DIAGNOSIS — R21 Rash and other nonspecific skin eruption: Secondary | ICD-10-CM | POA: Diagnosis not present

## 2012-08-17 DIAGNOSIS — R5381 Other malaise: Secondary | ICD-10-CM | POA: Diagnosis not present

## 2012-08-17 DIAGNOSIS — E041 Nontoxic single thyroid nodule: Secondary | ICD-10-CM | POA: Diagnosis not present

## 2012-08-24 DIAGNOSIS — Z0000 Encounter for general adult medical examination without abnormal findings: Secondary | ICD-10-CM | POA: Diagnosis not present

## 2012-08-24 DIAGNOSIS — I739 Peripheral vascular disease, unspecified: Secondary | ICD-10-CM | POA: Diagnosis not present

## 2012-08-24 DIAGNOSIS — I10 Essential (primary) hypertension: Secondary | ICD-10-CM | POA: Diagnosis not present

## 2012-08-24 DIAGNOSIS — E785 Hyperlipidemia, unspecified: Secondary | ICD-10-CM | POA: Diagnosis not present

## 2012-08-24 DIAGNOSIS — Z125 Encounter for screening for malignant neoplasm of prostate: Secondary | ICD-10-CM | POA: Diagnosis not present

## 2012-08-24 DIAGNOSIS — W57XXXA Bitten or stung by nonvenomous insect and other nonvenomous arthropods, initial encounter: Secondary | ICD-10-CM | POA: Diagnosis not present

## 2012-08-24 DIAGNOSIS — R7301 Impaired fasting glucose: Secondary | ICD-10-CM | POA: Diagnosis not present

## 2012-08-24 DIAGNOSIS — M19049 Primary osteoarthritis, unspecified hand: Secondary | ICD-10-CM | POA: Diagnosis not present

## 2012-08-24 DIAGNOSIS — L509 Urticaria, unspecified: Secondary | ICD-10-CM | POA: Diagnosis not present

## 2012-08-24 DIAGNOSIS — IMO0002 Reserved for concepts with insufficient information to code with codable children: Secondary | ICD-10-CM | POA: Diagnosis not present

## 2012-09-03 DIAGNOSIS — T148 Other injury of unspecified body region: Secondary | ICD-10-CM | POA: Diagnosis not present

## 2012-09-03 DIAGNOSIS — W57XXXA Bitten or stung by nonvenomous insect and other nonvenomous arthropods, initial encounter: Secondary | ICD-10-CM | POA: Diagnosis not present

## 2012-09-26 DIAGNOSIS — M109 Gout, unspecified: Secondary | ICD-10-CM | POA: Diagnosis not present

## 2012-09-26 DIAGNOSIS — IMO0002 Reserved for concepts with insufficient information to code with codable children: Secondary | ICD-10-CM | POA: Diagnosis not present

## 2012-09-26 DIAGNOSIS — M25569 Pain in unspecified knee: Secondary | ICD-10-CM | POA: Diagnosis not present

## 2012-11-02 DIAGNOSIS — I739 Peripheral vascular disease, unspecified: Secondary | ICD-10-CM | POA: Diagnosis not present

## 2012-11-02 DIAGNOSIS — H4011X0 Primary open-angle glaucoma, stage unspecified: Secondary | ICD-10-CM | POA: Diagnosis not present

## 2012-11-02 DIAGNOSIS — IMO0002 Reserved for concepts with insufficient information to code with codable children: Secondary | ICD-10-CM | POA: Diagnosis not present

## 2012-11-02 DIAGNOSIS — I10 Essential (primary) hypertension: Secondary | ICD-10-CM | POA: Diagnosis not present

## 2012-11-02 DIAGNOSIS — I8310 Varicose veins of unspecified lower extremity with inflammation: Secondary | ICD-10-CM | POA: Diagnosis not present

## 2012-11-17 DIAGNOSIS — Z23 Encounter for immunization: Secondary | ICD-10-CM | POA: Diagnosis not present

## 2012-11-23 ENCOUNTER — Ambulatory Visit (HOSPITAL_COMMUNITY)
Admission: RE | Admit: 2012-11-23 | Discharge: 2012-11-23 | Disposition: A | Discharge status: Home / Self Care | Payer: Medicare Other | Source: Ambulatory Visit | Attending: Surgery | Admitting: Surgery

## 2012-11-23 ENCOUNTER — Other Ambulatory Visit (HOSPITAL_COMMUNITY): Payer: Self-pay | Admitting: Internal Medicine

## 2012-11-23 DIAGNOSIS — I739 Peripheral vascular disease, unspecified: Secondary | ICD-10-CM

## 2013-01-26 DIAGNOSIS — H4011X0 Primary open-angle glaucoma, stage unspecified: Secondary | ICD-10-CM | POA: Diagnosis not present

## 2013-02-13 DIAGNOSIS — I10 Essential (primary) hypertension: Secondary | ICD-10-CM | POA: Diagnosis not present

## 2013-02-13 DIAGNOSIS — E785 Hyperlipidemia, unspecified: Secondary | ICD-10-CM | POA: Diagnosis not present

## 2013-02-13 DIAGNOSIS — I739 Peripheral vascular disease, unspecified: Secondary | ICD-10-CM | POA: Diagnosis not present

## 2013-02-13 DIAGNOSIS — IMO0002 Reserved for concepts with insufficient information to code with codable children: Secondary | ICD-10-CM | POA: Diagnosis not present

## 2013-02-13 DIAGNOSIS — R7301 Impaired fasting glucose: Secondary | ICD-10-CM | POA: Diagnosis not present

## 2013-02-24 ENCOUNTER — Encounter: Payer: Self-pay | Admitting: Family

## 2013-02-27 ENCOUNTER — Ambulatory Visit (INDEPENDENT_AMBULATORY_CARE_PROVIDER_SITE_OTHER): Payer: Medicare Other | Admitting: Family

## 2013-02-27 ENCOUNTER — Encounter: Payer: Self-pay | Admitting: Family

## 2013-02-27 ENCOUNTER — Ambulatory Visit (HOSPITAL_COMMUNITY)
Admission: RE | Admit: 2013-02-27 | Discharge: 2013-02-27 | Disposition: A | Discharge status: Home / Self Care | Payer: Medicare Other | Source: Ambulatory Visit | Attending: Family | Admitting: Family

## 2013-02-27 VITALS — BP 143/84 | HR 72 | Resp 16 | Ht 67.0 in | Wt 161.0 lb

## 2013-02-27 DIAGNOSIS — I714 Abdominal aortic aneurysm, without rupture, unspecified: Secondary | ICD-10-CM | POA: Insufficient documentation

## 2013-02-27 NOTE — Addendum Note (Signed)
Addended by: MCCHESNEY, MARILYN K on: 02/27/2013 02:49 PM   Modules accepted: Orders  

## 2013-02-27 NOTE — Progress Notes (Signed)
[facility] [place]  Established Abdominal Aortic Aneurysm  History of Present Illness  Justin [name] is a 80 y.o. (Feb 10, 1923) male patient of Dr. Trula Slade who presents with chief complaint: follow up for AAA.  Previous studies demonstrate an AAA, measuring 4.3 cm a year ago.  The patient does not have abdominal pain.   He had several lumbar spine surgeries and rarely has back pain, denies any new back pain. The patient denies symptoms referable to claudication in legs with walking. He does have left ankle tight feeling most of the time and left foot numbness. The patient denies history of stroke or TIA symptoms.  Pt Diabetic: No Pt smoker: former smoker, quit in 1980  Past Medical History  Diagnosis Date  . Hiatal hernia   . AAA (abdominal aortic aneurysm)     "we've known about it since ~ 2000"  . Hypertension     Takes lisinopril/hctz  . Bronchitis     hx of; "once or twice in my life" (04/21/11)  . Arthritis     in back and hands  . GERD (gastroesophageal reflux disease)   . Stomach ulcer     "years ago; treated w/diet"   . Migraine     "left ~ 1980's"  . Chronic lower back pain 1995  . Gout of big toe     "h/o in both"  . Atrial fibrillation     a. 04/2011 post op  . Lumbar stenosis     a. s/p  anterolateral decompression and lateral plating 04/21/2011  . Lumbar spondylosis    Past Surgical History  Procedure Laterality Date  . Prostate surgery    . Eye surgery    . Anterior lat lumbar fusion  04/21/11    w/PEEK cage, allograft, and lateral plate (r)  . Tonsillectomy      "as a child"  . Appendectomy  1946  . Back surgery  04/21/11    "today makes the 5th time"  . Cataract extraction, bilateral    . Anterior lat lumbar fusion  04/21/2011    Procedure: ANTERIOR LATERAL LUMBAR FUSION 1 LEVEL;  Surgeon: Erline Levine, MD;  Location: [facility];  Service: Neurosurgery;  Laterality: Right;  RIGHT Lumbar three-four anterolateral decompression with  fusion and lateral plating   Social History History   Social History  . Marital Status: Married    Spouse Name: N/A    Number of Children: N/A  . Years of Education: N/A   Occupational History  . Not on file.   Social History Main Topics  . Smoking status: Former Smoker -- 1.00 packs/day for 20 years    Types: Cigarettes    Quit date: 02/10/1980  . Smokeless tobacco: Never Used  . Alcohol Use: 0.0 oz/week     Comment: 04/21/11 'bottle of wine/year"  . Drug Use: No  . Sexual Activity: Yes   Other Topics Concern  . Not on file   Social History Narrative   [place]ives in [place].  Retired.  Activity limited by back pain.   Family History Family History  Problem Relation Age of Onset  . Alzheimer's disease Mother     died @ 21  . COPD Father     died @ 72  . Stroke Sister   . Cancer Brother     stomach  . Anesthesia problems Neg Hx   . Hypotension Neg Hx   . Malignant hyperthermia Neg Hx   . Pseudochol  deficiency Neg Hx     Current Outpatient Prescriptions on File Prior to Visit  Medication Sig Dispense Refill  . aspirin EC 81 MG tablet Take 81 mg by mouth daily.       . dorzolamide-timolol (COSOPT) 22.3-6.8 MG/ML ophthalmic solution Place 1 drop into both eyes 2 (two) times daily.       Marland Kitchen dutasteride (AVODART) 0.5 MG capsule Take 0.5 mg by mouth 2 (two) times a week.      Marland Kitchen lisinopril-hydrochlorothiazide (PRINZIDE,ZESTORETIC) 20-12.5 MG per tablet Take 1 tablet by mouth daily.      . metoprolol tartrate (LOPRESSOR) 25 MG tablet Take 1 tablet (25 mg total) by mouth 2 (two) times daily.  60 tablet  3   No current facility-administered medications on file prior to visit.   Allergies  Allergen Reactions  . Demerol Anaphylaxis    ROS: See HPI for pertinent positives and negatives.  Physical Examination  Filed Vitals:   02/27/13 0914  BP: 143/84  Pulse: 72  Resp: 16   Filed Weights   02/27/13 0914  Weight: 161 lb (73.029 kg)   Body mass  index is 25.21 kg/(m^2).  General: A&O x 3, WD male.  Pulmonary: Sym exp, good air movt, CTAB, no rales, rhonchi, or wheezing.   Cardiac: RRR, Nl S1, S2, no Murmurs detected.  Carotid Bruits Left Right   Negative Negative   Aorta is not palpable Radial pulses are are 3+ and =                          VASCULAR EXAM:                                                                                                         LE Pulses LEFT RIGHT       POPLITEAL  not palpable   not palpable       POSTERIOR TIBIAL   palpable    palpable        DORSALIS PEDIS      ANTERIOR TIBIAL not palpable  not palpable     Gastrointestinal: soft, NTND, -G/R, - HSM, - masses, - CVAT B.  Musculoskeletal: M/S 5/5 throughout, Extremities without ischemic changes.   Neurologic: CN 2-12 intact, Pain and light touch intact in extremities, Motor exam as listed above.  Non-Invasive Vascular Imaging  AAA Duplex (02/27/2013)  Previous size: 4.3 cm (Date: 02/29/2012)  Current size:  4.36 cm (Date: 02/27/2013)  Medical Decision Making  The patient is a 80 y.o. male who presents with asymptomatic AAA with no increase in size.   Based on this patient's exam and diagnostic studies, the patient will follow up in 1 year  with the following studies: AAA Duplex.  The threshold for repair is AAA size > 5.5 cm, growth > 1 cm/yr, and symptomatic status.  I emphasized the importance of maximal medical management including strict control of blood pressure, blood glucose, and lipid levels, antiplatelet agents, obtaining regular exercise, and cessation of smoking.   The patient was  given information about AAA including signs, symptoms, treatment, and how to minimize the risk of enlargement and rupture of aneurysms.    The patient was advised to call 911 should the patient experience sudden onset abdominal or back pain.   Thank you for allowing [place] to participate in this patient's care.  Clemon Chambers, RN, MSN,  FNP-C [facility] Office: [facility] Physician: Trula Slade  02/27/2013, 9:12 AM

## 2013-02-27 NOTE — Patient Instructions (Signed)
Abdominal Aortic Aneurysm An aneurysm is a weakened or damaged part of an artery [place] that bulges from the normal force of blood pumping through the body. An abdominal aortic aneurysm is an aneurysm that occurs in the lower part of the aorta, the main artery of the body.  The major concern with an abdominal aortic aneurysm is that it can enlarge and burst (rupture) or blood can flow between the layers of the [place] of the aorta through a tear (aorticdissection). Both of these conditions can cause bleeding inside the body and can be life threatening unless diagnosed and treated promptly. CAUSES  The exact cause of an abdominal aortic aneurysm is unknown. Some contributing factors are:   A hardening of the arteries caused by the buildup of fat and other substances in the lining of a blood vessel (arteriosclerosis).  Inflammation of the walls of an artery (arteritis).   Connective tissue diseases, such as Marfan syndrome.   Abdominal trauma.   An infection, such as syphilis or staphylococcus, in the [place] of the aorta (infectious aortitis) caused by bacteria. RISK FACTORS  Risk factors that contribute to an abdominal aortic aneurysm may include:  Age older than 60 years.   High blood pressure (hypertension).  Male gender.  Ethnicity (white race).  Obesity.  Family history of aneurysm (first degree relatives only).  Tobacco use. PREVENTION  The following healthy lifestyle habits may help decrease your risk of abdominal aortic aneurysm:  Quitting smoking. Smoking can raise your blood pressure and cause arteriosclerosis.  Limiting or avoiding alcohol.  Keeping your blood pressure, blood sugar level, and cholesterol levels within normal limits.  Decreasing your salt intake. In somepeople, too much salt can raise blood pressure and increase your risk of abdominal aortic aneurysm.  Eating a diet low in saturated fats and cholesterol.  Increasing your fiber intake by including  whole grains, vegetables, and fruits in your diet. Eating these foods may help lower blood pressure.  Maintaining a healthy weight.  Staying physically active and exercising regularly. SYMPTOMS  The symptoms of abdominal aortic aneurysm may vary depending on the size and rate of growth of the aneurysm.Most grow slowly and do not have any symptoms. When symptoms do occur, they may include:  Pain (abdomen, side, lower back, or groin). The pain may vary in intensity. A sudden onset of severe pain may indicate that the aneurysm has ruptured.  Feeling full after eating only small amounts of food.  Nausea or vomiting or both.  Feeling a pulsating lump in the abdomen.  Feeling faint or passing out. DIAGNOSIS  Since most unruptured abdominal aortic aneurysms have no symptoms, they are often discovered during diagnostic exams for other conditions. An aneurysm may be found during the following procedures:  Ultrasonography (A one-time screening for abdominal aortic aneurysm by ultrasonography is also recommended for all men aged 65-75 years who have ever smoked).  X-ray exams.  A computed tomography (CT).  Magnetic resonance imaging (MRI).  Angiography or arteriography. TREATMENT  Treatment of an abdominal aortic aneurysm depends on the size of your aneurysm, your age, and risk factors for rupture. Medication to control blood pressure and pain may be used to manage aneurysms smaller than 6 cm. Regular monitoring for enlargement may be recommended by your caregiver if:  The aneurysm is 3 4 cm in size (an annual ultrasonography may be recommended).  The aneurysm is 4 4.5 cm in size (an ultrasonography every 6 months may be recommended).  The aneurysm is larger than 4.5   cm in size (your caregiver may ask that you be examined by a vascular surgeon). If your aneurysm is larger than 6 cm, surgical repair may be recommended. There are two main methods for repair of an aneurysm:   Endovascular  repair (a minimally invasive surgery). This is done most often.  Open repair. This method is used if an endovascular repair is not possible. Document Released: 11/05/2004 Document Revised: 05/23/2012 Document Reviewed: 02/26/2012 Exit[employer] Patient Information 2014 [employer].   

## 2013-04-21 DIAGNOSIS — H4011X0 Primary open-angle glaucoma, stage unspecified: Secondary | ICD-10-CM | POA: Diagnosis not present

## 2013-05-18 DIAGNOSIS — S60229A Contusion of unspecified hand, initial encounter: Secondary | ICD-10-CM | POA: Diagnosis not present

## 2013-06-22 DIAGNOSIS — I10 Essential (primary) hypertension: Secondary | ICD-10-CM | POA: Diagnosis not present

## 2013-06-22 DIAGNOSIS — T148 Other injury of unspecified body region: Secondary | ICD-10-CM | POA: Diagnosis not present

## 2013-06-22 DIAGNOSIS — M109 Gout, unspecified: Secondary | ICD-10-CM | POA: Diagnosis not present

## 2013-06-22 DIAGNOSIS — IMO0002 Reserved for concepts with insufficient information to code with codable children: Secondary | ICD-10-CM | POA: Diagnosis not present

## 2013-06-22 DIAGNOSIS — R509 Fever, unspecified: Secondary | ICD-10-CM | POA: Diagnosis not present

## 2013-07-18 DIAGNOSIS — M25549 Pain in joints of unspecified hand: Secondary | ICD-10-CM | POA: Diagnosis not present

## 2013-07-18 DIAGNOSIS — S6390XA Sprain of unspecified part of unspecified wrist and hand, initial encounter: Secondary | ICD-10-CM | POA: Diagnosis not present

## 2013-08-15 DIAGNOSIS — M25549 Pain in joints of unspecified hand: Secondary | ICD-10-CM | POA: Diagnosis not present

## 2013-08-15 DIAGNOSIS — S6390XA Sprain of unspecified part of unspecified wrist and hand, initial encounter: Secondary | ICD-10-CM | POA: Diagnosis not present

## 2013-09-06 DIAGNOSIS — IMO0002 Reserved for concepts with insufficient information to code with codable children: Secondary | ICD-10-CM | POA: Diagnosis not present

## 2013-09-06 DIAGNOSIS — M79609 Pain in unspecified limb: Secondary | ICD-10-CM | POA: Diagnosis not present

## 2013-09-07 DIAGNOSIS — S6390XA Sprain of unspecified part of unspecified wrist and hand, initial encounter: Secondary | ICD-10-CM | POA: Diagnosis not present

## 2013-09-07 DIAGNOSIS — M25549 Pain in joints of unspecified hand: Secondary | ICD-10-CM | POA: Diagnosis not present

## 2013-10-09 DIAGNOSIS — M79609 Pain in unspecified limb: Secondary | ICD-10-CM | POA: Diagnosis not present

## 2013-10-09 DIAGNOSIS — B351 Tinea unguium: Secondary | ICD-10-CM | POA: Diagnosis not present

## 2013-10-09 DIAGNOSIS — M129 Arthropathy, unspecified: Secondary | ICD-10-CM | POA: Diagnosis not present

## 2013-10-24 DIAGNOSIS — S6390XA Sprain of unspecified part of unspecified wrist and hand, initial encounter: Secondary | ICD-10-CM | POA: Diagnosis not present

## 2013-10-24 DIAGNOSIS — M25549 Pain in joints of unspecified hand: Secondary | ICD-10-CM | POA: Diagnosis not present

## 2013-11-22 DIAGNOSIS — S63652D Sprain of metacarpophalangeal joint of right middle finger, subsequent encounter: Secondary | ICD-10-CM | POA: Diagnosis not present

## 2013-11-22 DIAGNOSIS — G5601 Carpal tunnel syndrome, right upper limb: Secondary | ICD-10-CM | POA: Diagnosis not present

## 2013-11-22 DIAGNOSIS — G5602 Carpal tunnel syndrome, left upper limb: Secondary | ICD-10-CM | POA: Diagnosis not present

## 2013-12-19 DIAGNOSIS — S63652D Sprain of metacarpophalangeal joint of right middle finger, subsequent encounter: Secondary | ICD-10-CM | POA: Diagnosis not present

## 2014-01-03 DIAGNOSIS — H5330 Unspecified disorder of binocular vision: Secondary | ICD-10-CM | POA: Diagnosis not present

## 2014-01-03 DIAGNOSIS — H4011X0 Primary open-angle glaucoma, stage unspecified: Secondary | ICD-10-CM | POA: Diagnosis not present

## 2014-01-10 DIAGNOSIS — Z23 Encounter for immunization: Secondary | ICD-10-CM | POA: Diagnosis not present

## 2014-01-11 DIAGNOSIS — S63652D Sprain of metacarpophalangeal joint of right middle finger, subsequent encounter: Secondary | ICD-10-CM | POA: Diagnosis not present

## 2014-01-11 DIAGNOSIS — G5601 Carpal tunnel syndrome, right upper limb: Secondary | ICD-10-CM | POA: Diagnosis not present

## 2014-01-17 DIAGNOSIS — M2550 Pain in unspecified joint: Secondary | ICD-10-CM | POA: Diagnosis not present

## 2014-01-17 DIAGNOSIS — Z6824 Body mass index (BMI) 24.0-24.9, adult: Secondary | ICD-10-CM | POA: Diagnosis not present

## 2014-01-19 DIAGNOSIS — H00024 Hordeolum internum left upper eyelid: Secondary | ICD-10-CM | POA: Diagnosis not present

## 2014-01-19 DIAGNOSIS — H4011X1 Primary open-angle glaucoma, mild stage: Secondary | ICD-10-CM | POA: Diagnosis not present

## 2014-02-26 ENCOUNTER — Encounter: Payer: Self-pay | Admitting: Family

## 2014-02-27 ENCOUNTER — Inpatient Hospital Stay (HOSPITAL_COMMUNITY): Admission: RE | Admit: 2014-02-27 | Payer: Medicare Other | Source: Ambulatory Visit

## 2014-02-27 ENCOUNTER — Ambulatory Visit: Payer: Medicare Other | Admitting: Family

## 2014-03-01 ENCOUNTER — Encounter: Payer: Self-pay | Admitting: Family

## 2014-03-05 ENCOUNTER — Encounter: Payer: Self-pay | Admitting: Family

## 2014-03-05 ENCOUNTER — Ambulatory Visit (HOSPITAL_COMMUNITY)
Admission: RE | Admit: 2014-03-05 | Discharge: 2014-03-05 | Disposition: A | Discharge status: Home / Self Care | Payer: Medicare Other | Source: Ambulatory Visit | Attending: Family | Admitting: Family

## 2014-03-05 ENCOUNTER — Ambulatory Visit (INDEPENDENT_AMBULATORY_CARE_PROVIDER_SITE_OTHER): Payer: Medicare Other | Admitting: Family

## 2014-03-05 VITALS — BP 123/84 | HR 70 | Resp 16 | Ht 69.5 in | Wt 164.0 lb

## 2014-03-05 DIAGNOSIS — I714 Abdominal aortic aneurysm, without rupture, unspecified: Secondary | ICD-10-CM

## 2014-03-05 DIAGNOSIS — Z87891 Personal history of nicotine dependence: Secondary | ICD-10-CM | POA: Insufficient documentation

## 2014-03-05 NOTE — Addendum Note (Signed)
Addended by: Mena Goes on: 03/05/2014 11:37 AM   Modules accepted: Orders

## 2014-03-05 NOTE — Progress Notes (Signed)
[facility] [place]  Established Abdominal Aortic Aneurysm  History of Present Illness  Justin [name] is a 81 y.o. ([birth year]) male patient of Dr. Trula Slade who presents with chief complaint: follow up for AAA. Previous studies demonstrate an AAA, measuring 4.3 cm a year ago. The patient does not have abdominal pain.  He had several lumbar spine surgeries and rarely has back pain, denies any new back pain. The patient denies symptoms referable to claudication in legs with walking. He does have left ankle tight feeling most of the time and left foot numbness. The patient denies history of stroke or TIA symptoms.  He takes a daily 81 mg ASA, daily beta blocker, does not take a statin, states he does not have cholesterol problem.   Pt Diabetic: No Pt smoker: former smoker, quit in 1980  Past Medical History  Diagnosis Date  . Hiatal hernia   . AAA (abdominal aortic aneurysm)     "we've known about it since ~ 2000"  . Hypertension     Takes lisinopril/hctz  . Bronchitis     hx of; "once or twice in my life" (04/21/11)  . Arthritis     in back and hands  . GERD (gastroesophageal reflux disease)   . Stomach ulcer     "years ago; treated w/diet"   . Migraine     "left ~ 1980's"  . Chronic lower back pain 1995  . Gout of big toe     "h/o in both"  . Atrial fibrillation     a. 04/2011 post op  . Lumbar stenosis     a. s/p  anterolateral decompression and lateral plating 04/21/2011  . Lumbar spondylosis    Past Surgical History  Procedure Laterality Date  . Prostate surgery    . Anterior lat lumbar fusion  04/21/11    w/PEEK cage, allograft, and lateral plate (r)  . Tonsillectomy      "as a child"  . Appendectomy  1946  . Back surgery  04/21/11    "today makes the 5th time"  . Cataract extraction, bilateral    . Anterior lat lumbar fusion  04/21/2011    Procedure: ANTERIOR LATERAL LUMBAR FUSION 1 LEVEL;  Surgeon: Erline Levine, MD;  Location: [facility];  Service: Neurosurgery;  Laterality: Right;  RIGHT Lumbar three-four anterolateral decompression with fusion and lateral plating  . Spine surgery    . Eye surgery Bilateral    Social History History   Social History  . Marital Status: Married    Spouse Name: N/A    Number of Children: N/A  . Years of Education: N/A   Occupational History  . Not on file.   Social History Main Topics  . Smoking status: Former Smoker -- 1.00 packs/day for 20 years    Types: Cigarettes    Quit date: 02/10/1980  . Smokeless tobacco: Never Used  . Alcohol Use: 0.0 oz/week     Comment: 04/21/11 'bottle of wine/year"  . Drug Use: No  . Sexual Activity: Yes   Other Topics Concern  . Not on file   Social History Narrative   Lives in [place].  Retired.  Activity limited by back pain.   Family History Family History  Problem Relation Age of Onset  . Alzheimer's disease Mother     died @ 78  . COPD Father     died @ 46  . Cancer Father   . Hypertension Father   .  Stroke Sister   . Cancer Brother     stomach  . Anesthesia problems Neg Hx   . Hypotension Neg Hx   . Malignant hyperthermia Neg Hx   . Pseudochol deficiency Neg Hx   . Hypertension Son   . Hypertension Son     Current Outpatient Prescriptions on File Prior to Visit  Medication Sig Dispense Refill  . aspirin EC 81 MG tablet Take 81 mg by mouth daily.     . brimonidine (ALPHAGAN) 0.15 % ophthalmic solution     . COLCRYS 0.6 MG tablet     . dorzolamide-timolol (COSOPT) 22.3-6.8 MG/ML ophthalmic solution Place 1 drop into both eyes 2 (two) times daily.     Marland Kitchen dutasteride (AVODART) 0.5 MG capsule Take 0.5 mg by mouth 2 (two) times a week.    Marland Kitchen lisinopril-hydrochlorothiazide (PRINZIDE,ZESTORETIC) 20-12.5 MG per tablet Take 1 tablet by mouth daily.    . metoprolol tartrate (LOPRESSOR) 25 MG tablet Take 1 tablet (25 mg total) by mouth 2 (two) times daily. 60 tablet 3   No current facility-administered  medications on file prior to visit.   Allergies  Allergen Reactions  . Demerol Anaphylaxis    ROS: See HPI for pertinent positives and negatives.  Physical Examination   General: A&O x 3, WD male.  Pulmonary: Sym exp, good air movt, CTAB, no rales, rhonchi, or wheezing.   Cardiac: RRR, Nl S1, S2, no Murmurs detected.  Carotid Bruits Left Right   Negative Negative  Aorta is not palpable Radial pulses are are 2+ and =   VASCULAR EXAM:     LE Pulses LEFT RIGHT       FEMORAL palpable palpable   POPLITEAL not palpable  not palpable   POSTERIOR TIBIAL  palpable   palpable    DORSALIS PEDIS  ANTERIOR TIBIAL not palpable  not palpable     Gastrointestinal: soft, NTND, -G/R, - HSM, - palpable masses, - CVAT B.  Musculoskeletal: M/S 5/5 throughout, Extremities without ischemic changes.   Neurologic: CN 2-12 intact, Pain and light touch intact in extremities, Motor exam as listed above.    Non-Invasive Vascular Imaging  AAA Duplex (03/05/2014)  ABDOMINAL AORTA DUPLEX EVALUATION    INDICATION: Evaluation of abdominal aorta.    PREVIOUS INTERVENTION(S):     DUPLEX EXAM:     LOCATION DIAMETER AP (cm) DIAMETER TRANSVERSE (cm) VELOCITIES (cm/sec)  Aorta Proximal Not Visualized  Not Visualized   Aorta Mid 3.82  54  Aorta Distal 4.65 4.57 58  Right Common Iliac Artery 1.17 1.31   Left Common Iliac Artery Not Visualized Not Visualized     Previous max aortic diameter:  4.35 x 4.36 Date: 02/27/2013     ADDITIONAL FINDINGS: The mid to distal abdominal aorta is tortuous with intraluminal thrombus noted. Limited visualization due to overlying bowel gas.    IMPRESSION: Patent abdominal aortic aneurysm measuring approximately 4.65 x 4.57 cm in diameter, based on limited  visualization as described above.    Compared to the previous exam:  Slight increase in the maximum diameter when compared to the last exam.     Medical Decision Making  The patient is a 81 y.o. male who presents with asymptomatic AAA with a slight increase in the maximum diameter (4.65 cm) when compared to the last exam (4.36 cm) in one year.   Based on this patient's exam and diagnostic studies, the patient will follow up in 6 months with the following studies: AAA Duplex.  Consideration for repair  of AAA would be made when the size approaches 4.8 or 5.0 cm, growth > 1 cm/yr, and symptomatic status.  I emphasized the importance of maximal medical management including strict control of blood pressure, blood glucose, and lipid levels, antiplatelet agents, obtaining regular exercise, and continued cessation of smoking.   The patient was given information about AAA including signs, symptoms, treatment, and how to minimize the risk of enlargement and rupture of aneurysms.    The patient was advised to call 911 should the patient experience sudden onset abdominal or back pain.   Thank you for allowing [place] to participate in this patient's care.  Clemon Chambers, RN, MSN, FNP-C [facility] [place] Office: [facility] Physician: Trula Slade  03/05/2014, 9:11 AM

## 2014-03-05 NOTE — Patient Instructions (Signed)
Abdominal Aortic Aneurysm An aneurysm is a weakened or damaged part of an artery [place] that bulges from the normal force of blood pumping through the body. An abdominal aortic aneurysm is an aneurysm that occurs in the lower part of the aorta, the main artery of the body.  The major concern with an abdominal aortic aneurysm is that it can enlarge and burst (rupture) or blood can flow between the layers of the [place] of the aorta through a tear (aorticdissection). Both of these conditions can cause bleeding inside the body and can be life threatening unless diagnosed and treated promptly. CAUSES  The exact cause of an abdominal aortic aneurysm is unknown. Some contributing factors are:   A hardening of the arteries caused by the buildup of fat and other substances in the lining of a blood vessel (arteriosclerosis).  Inflammation of the walls of an artery (arteritis).   Connective tissue diseases, such as Marfan syndrome.   Abdominal trauma.   An infection, such as syphilis or staphylococcus, in the [place] of the aorta (infectious aortitis) caused by bacteria. RISK FACTORS  Risk factors that contribute to an abdominal aortic aneurysm may include:  Age older than 60 years.   High blood pressure (hypertension).  Male gender.  Ethnicity (white race).  Obesity.  Family history of aneurysm (first degree relatives only).  Tobacco use. PREVENTION  The following healthy lifestyle habits may help decrease your risk of abdominal aortic aneurysm:  Quitting smoking. Smoking can raise your blood pressure and cause arteriosclerosis.  Limiting or avoiding alcohol.  Keeping your blood pressure, blood sugar level, and cholesterol levels within normal limits.  Decreasing your salt intake. In somepeople, too much salt can raise blood pressure and increase your risk of abdominal aortic aneurysm.  Eating a diet low in saturated fats and cholesterol.  Increasing your fiber intake by including  whole grains, vegetables, and fruits in your diet. Eating these foods may help lower blood pressure.  Maintaining a healthy weight.  Staying physically active and exercising regularly. SYMPTOMS  The symptoms of abdominal aortic aneurysm may vary depending on the size and rate of growth of the aneurysm.Most grow slowly and do not have any symptoms. When symptoms do occur, they may include:  Pain (abdomen, side, lower back, or groin). The pain may vary in intensity. A sudden onset of severe pain may indicate that the aneurysm has ruptured.  Feeling full after eating only small amounts of food.  Nausea or vomiting or both.  Feeling a pulsating lump in the abdomen.  Feeling faint or passing out. DIAGNOSIS  Since most unruptured abdominal aortic aneurysms have no symptoms, they are often discovered during diagnostic exams for other conditions. An aneurysm may be found during the following procedures:  Ultrasonography (A one-time screening for abdominal aortic aneurysm by ultrasonography is also recommended for all men aged 65-75 years who have ever smoked).  X-ray exams.  A computed tomography (CT).  Magnetic resonance imaging (MRI).  Angiography or arteriography. TREATMENT  Treatment of an abdominal aortic aneurysm depends on the size of your aneurysm, your age, and risk factors for rupture. Medication to control blood pressure and pain may be used to manage aneurysms smaller than 6 cm. Regular monitoring for enlargement may be recommended by your caregiver if:  The aneurysm is 3-4 cm in size (an annual ultrasonography may be recommended).  The aneurysm is 4-4.5 cm in size (an ultrasonography every 6 months may be recommended).  The aneurysm is larger than 4.5 cm in   size (your caregiver may ask that you be examined by a vascular surgeon). If your aneurysm is larger than 6 cm, surgical repair may be recommended. There are two main methods for repair of an aneurysm:   Endovascular  repair (a minimally invasive surgery). This is done most often.  Open repair. This method is used if an endovascular repair is not possible. Document Released: 11/05/2004 Document Revised: 05/23/2012 Document Reviewed: 02/26/2012 ExitCare Patient Information 2015 [employer]. This information is not intended to replace advice given to you by your health care provider. Make sure you discuss any questions you have with your health care provider.  

## 2014-03-19 DIAGNOSIS — H4011X4 Primary open-angle glaucoma, indeterminate stage: Secondary | ICD-10-CM | POA: Diagnosis not present

## 2014-06-15 DIAGNOSIS — H4011X1 Primary open-angle glaucoma, mild stage: Secondary | ICD-10-CM | POA: Diagnosis not present

## 2014-06-17 DIAGNOSIS — H401131 Primary open-angle glaucoma, bilateral, mild stage: Secondary | ICD-10-CM | POA: Insufficient documentation

## 2014-06-22 DIAGNOSIS — Z6823 Body mass index (BMI) 23.0-23.9, adult: Secondary | ICD-10-CM | POA: Diagnosis not present

## 2014-06-22 DIAGNOSIS — M109 Gout, unspecified: Secondary | ICD-10-CM | POA: Diagnosis not present

## 2014-07-16 DIAGNOSIS — H4011X1 Primary open-angle glaucoma, mild stage: Secondary | ICD-10-CM | POA: Diagnosis not present

## 2014-07-16 DIAGNOSIS — M109 Gout, unspecified: Secondary | ICD-10-CM | POA: Diagnosis not present

## 2014-07-25 DIAGNOSIS — Z6823 Body mass index (BMI) 23.0-23.9, adult: Secondary | ICD-10-CM | POA: Diagnosis not present

## 2014-07-25 DIAGNOSIS — M109 Gout, unspecified: Secondary | ICD-10-CM | POA: Diagnosis not present

## 2014-08-30 ENCOUNTER — Encounter: Payer: Self-pay | Admitting: Family

## 2014-09-03 ENCOUNTER — Ambulatory Visit: Payer: Medicare Other | Admitting: Family

## 2014-09-03 ENCOUNTER — Other Ambulatory Visit (HOSPITAL_COMMUNITY): Payer: Medicare Other

## 2014-09-12 ENCOUNTER — Other Ambulatory Visit (HOSPITAL_COMMUNITY): Payer: Medicare Other

## 2014-09-12 ENCOUNTER — Ambulatory Visit: Payer: Medicare Other | Admitting: Family

## 2014-09-17 ENCOUNTER — Encounter: Payer: Self-pay | Admitting: Family

## 2014-09-18 ENCOUNTER — Ambulatory Visit (INDEPENDENT_AMBULATORY_CARE_PROVIDER_SITE_OTHER): Payer: Medicare Other | Admitting: Family

## 2014-09-18 ENCOUNTER — Encounter: Payer: Self-pay | Admitting: Family

## 2014-09-18 ENCOUNTER — Ambulatory Visit (HOSPITAL_COMMUNITY)
Admission: RE | Admit: 2014-09-18 | Discharge: 2014-09-18 | Disposition: A | Discharge status: Home / Self Care | Payer: Medicare Other | Source: Ambulatory Visit | Attending: Family | Admitting: Family

## 2014-09-18 VITALS — BP 109/75 | HR 75 | Temp 97.0°F | Resp 16 | Ht 67.75 in | Wt 152.0 lb

## 2014-09-18 DIAGNOSIS — I714 Abdominal aortic aneurysm, without rupture, unspecified: Secondary | ICD-10-CM

## 2014-09-18 DIAGNOSIS — Z87891 Personal history of nicotine dependence: Secondary | ICD-10-CM | POA: Diagnosis not present

## 2014-09-18 NOTE — Progress Notes (Signed)
[facility] [place]  Established Abdominal Aortic Aneurysm  History of Present Illness  Justin [name] is a 81 y.o. (01/02/1924) male patient of Dr. Trula Slade who presents with chief complaint: follow up for AAA. Previous studies demonstrate an AAA, measuring 4.65 cm on 03/05/14. His AAA was an incidental finding from imaging evaluating his spine issues.  The patient does not have abdominal pain.  He had several lumbar spine surgeries and rarely has back pain, denies any new back pain. The patient denies symptoms referable to claudication in legs with walking. He does have left ankle tight feeling most of the time and left foot numbness. The patient denies history of stroke or TIA symptoms.  He takes a daily 81 mg ASA, daily beta blocker, does not take a statin, states he does not have cholesterol problem.   Pt Diabetic: No Pt smoker: former smoker, quit in 1980   Past Medical History  Diagnosis Date  . Hiatal hernia   . AAA (abdominal aortic aneurysm)     "we've known about it since ~ 2000"  . Hypertension     Takes lisinopril/hctz  . Bronchitis     hx of; "once or twice in my life" (04/21/11)  . GERD (gastroesophageal reflux disease)   . Stomach ulcer     "years ago; treated w/diet"   . Migraine     "left ~ 1980's"  . Chronic lower back pain 1995  . Gout of big toe     "h/o in both"  . Atrial fibrillation     a. 04/2011 post op  . Lumbar stenosis     a. s/p  anterolateral decompression and lateral plating 04/21/2011  . Arthritis     in back and hands:  Gout  . Lumbar spondylosis   . Chronic kidney disease    Past Surgical History  Procedure Laterality Date  . Prostate surgery    . Anterior lat lumbar fusion  04/21/11    w/PEEK cage, allograft, and lateral plate (r)  . Tonsillectomy      "as a child"  . Appendectomy  1946  . Back surgery  04/21/11    "today makes the 5th time"  . Cataract extraction, bilateral    . Anterior lat lumbar fusion   04/21/2011    Procedure: ANTERIOR LATERAL LUMBAR FUSION 1 LEVEL;  Surgeon: Erline Levine, MD;  Location: [facility];  Service: Neurosurgery;  Laterality: Right;  RIGHT Lumbar three-four anterolateral decompression with fusion and lateral plating  . Eye surgery Bilateral   . Spine surgery      X's 5    Social History History   Social History  . Marital Status: Married    Spouse Name: N/A  . Number of Children: N/A  . Years of Education: N/A   Occupational History  . Not on file.   Social History Main Topics  . Smoking status: Former Smoker -- 1.00 packs/day for 20 years    Types: Cigarettes    Quit date: 02/10/1980  . Smokeless tobacco: Never Used  . Alcohol Use: 0.0 oz/week     Comment: 04/21/11 'bottle of wine/year"  . Drug Use: No  . Sexual Activity: Yes   Other Topics Concern  . Not on file   Social History Narrative   Lives in [place] his wife.  Retired.  Activity limited by back pain.   Family History Family History  Problem Relation Age of Onset  . Alzheimer's disease Mother  died @ 88  . COPD Father     died @ 38  . Cancer Father   . Hypertension Father   . Heart disease Father   . Stroke Sister   . Cancer Brother     stomach  . Heart disease Brother     Aneurysm  . Anesthesia problems Neg Hx   . Hypotension Neg Hx   . Malignant hyperthermia Neg Hx   . Pseudochol deficiency Neg Hx   . Hypertension Son   . Heart disease Son     Aneurysm  . Hypertension Son   . Heart disease Son     Aneurysm    Current Outpatient Prescriptions on File Prior to Visit  Medication Sig Dispense Refill  . aspirin EC 81 MG tablet Take 81 mg by mouth daily.     . brimonidine (ALPHAGAN) 0.15 % ophthalmic solution     . dorzolamide-timolol (COSOPT) 22.3-6.8 MG/ML ophthalmic solution Place 1 drop into both eyes 2 (two) times daily.     Marland Kitchen dutasteride (AVODART) 0.5 MG capsule Take 0.5 mg by mouth 2 (two) times a week.    Marland Kitchen lisinopril-hydrochlorothiazide  (PRINZIDE,ZESTORETIC) 20-12.5 MG per tablet Take 1 tablet by mouth daily.    Marland Kitchen LUMIGAN 0.01 % SOLN Place into both eyes daily.   3  . allopurinol (ZYLOPRIM) 300 MG tablet daily. To prevent Gout  3  . COLCRYS 0.6 MG tablet as needed. Gout    . metoprolol tartrate (LOPRESSOR) 25 MG tablet Take 1 tablet (25 mg total) by mouth 2 (two) times daily. 60 tablet 3   No current facility-administered medications on file prior to visit.   Allergies  Allergen Reactions  . Demerol Anaphylaxis    ROS: See HPI for pertinent positives and negatives.  Physical Examination  Filed Vitals:   09/18/14 0921  BP: 109/75  Pulse: 75  Temp: 97 F (36.1 C)  TempSrc: Oral  Resp: 16  Height: 5' 7.75" (1.721 m)  Weight: 152 lb (68.947 kg)  SpO2: 99%   Body mass index is 23.28 kg/(m^2).  General: A&O x 3, WD male.  Pulmonary: Sym exp, good air movt, CTAB, no rales, rhonchi, or wheezing.   Cardiac: RRR, Nl S1, S2, no murmur detected.  Carotid Bruits Left Right   Negative Negative  Aorta is not palpable Radial pulses are are 2+ and =   VASCULAR EXAM:     LE Pulses LEFT RIGHT   FEMORAL palpable palpable   POPLITEAL not palpable  not palpable   POSTERIOR TIBIAL  palpable   palpable    DORSALIS PEDIS  ANTERIOR TIBIAL not palpable  not palpable     Gastrointestinal: soft, NTND, -G/R, - HSM, reducible moderate sized asymptomatic ventral hernia when lying down or sitting up, - CVAT B.  Musculoskeletal: M/S 5/5 throughout, Extremities without ischemic changes.   Neurologic: CN 2-12 intact, Pain and light touch intact in extremities, Motor exam as listed above.          Non-Invasive Vascular Imaging  AAA Duplex (09/18/2014) ABDOMINAL AORTA DUPLEX EVALUATION     INDICATION: Evaluation of abdominal aorta.    PREVIOUS INTERVENTION(S):     DUPLEX EXAM:     LOCATION DIAMETER AP (cm) DIAMETER TRANSVERSE (cm) VELOCITIES (cm/sec)  Aorta Proximal 2.92 3.07 68  Aorta Mid 4.20 4.32 51  Aorta Distal 3.52 3.35 22  Right Common Iliac Artery 1.36  43  Left Common Iliac Artery 1.71 1.76 71    Previous max aortic diameter:  4.65 x 4.57 Date: 03/05/2014     ADDITIONAL FINDINGS: The mid to distal abdominal aorta is tortuous with intraluminal thrombus noted. Areas of limited visualization due to overlying bowel gas.    IMPRESSION: Patent abdominal aortic aneurysm measuring approximately 4.20 x 4.32 cm in diameter.     Compared to the previous exam:  No significant change in comparison to the last exam on 03/05/2014.       Medical Decision Making  The patient is a 81 y.o. male who presents with asymptomatic AAA with no increase in size, maximum diameter today is 4.32 cm.   Based on this patient's exam and diagnostic studies, the patient will follow up in 6 months  with the following studies: AAA Duplex.  Consideration for repair of AAA would be made when the size is 5.5 cm, growth > 1 cm/yr, and symptomatic status.  I emphasized the importance of maximal medical management including strict control of blood pressure, blood glucose, and lipid levels, antiplatelet agents, obtaining regular exercise, and continued cessation of smoking.   The patient was given information about AAA including signs, symptoms, treatment, and how to minimize the risk of enlargement and rupture of aneurysms.    The patient was advised to call 911 should the patient experience sudden onset abdominal or back pain.   Thank you for allowing [place] to participate in this patient's care.  Clemon Chambers, RN, MSN, FNP-C [facility] [place] Office: [phone]  Clinic Physician: Early  09/18/2014, 9:41 AM

## 2014-09-18 NOTE — Addendum Note (Signed)
Addended by: Dorthula Rue L on: 09/18/2014 10:26 AM   Modules accepted: Orders

## 2014-09-18 NOTE — Patient Instructions (Signed)
Abdominal Aortic Aneurysm An aneurysm is a weakened or damaged part of an artery [place] that bulges from the normal force of blood pumping through the body. An abdominal aortic aneurysm is an aneurysm that occurs in the lower part of the aorta, the main artery of the body.  The major concern with an abdominal aortic aneurysm is that it can enlarge and burst (rupture) or blood can flow between the layers of the [place] of the aorta through a tear (aorticdissection). Both of these conditions can cause bleeding inside the body and can be life threatening unless diagnosed and treated promptly. CAUSES  The exact cause of an abdominal aortic aneurysm is unknown. Some contributing factors are:   A hardening of the arteries caused by the buildup of fat and other substances in the lining of a blood vessel (arteriosclerosis).  Inflammation of the walls of an artery (arteritis).   Connective tissue diseases, such as Marfan syndrome.   Abdominal trauma.   An infection, such as syphilis or staphylococcus, in the [place] of the aorta (infectious aortitis) caused by bacteria. RISK FACTORS  Risk factors that contribute to an abdominal aortic aneurysm may include:  Age older than 60 years.   High blood pressure (hypertension).  Male gender.  Ethnicity (white race).  Obesity.  Family history of aneurysm (first degree relatives only).  Tobacco use. PREVENTION  The following healthy lifestyle habits may help decrease your risk of abdominal aortic aneurysm:  Quitting smoking. Smoking can raise your blood pressure and cause arteriosclerosis.  Limiting or avoiding alcohol.  Keeping your blood pressure, blood sugar level, and cholesterol levels within normal limits.  Decreasing your salt intake. In somepeople, too much salt can raise blood pressure and increase your risk of abdominal aortic aneurysm.  Eating a diet low in saturated fats and cholesterol.  Increasing your fiber intake by including  whole grains, vegetables, and fruits in your diet. Eating these foods may help lower blood pressure.  Maintaining a healthy weight.  Staying physically active and exercising regularly. SYMPTOMS  The symptoms of abdominal aortic aneurysm may vary depending on the size and rate of growth of the aneurysm.Most grow slowly and do not have any symptoms. When symptoms do occur, they may include:  Pain (abdomen, side, lower back, or groin). The pain may vary in intensity. A sudden onset of severe pain may indicate that the aneurysm has ruptured.  Feeling full after eating only small amounts of food.  Nausea or vomiting or both.  Feeling a pulsating lump in the abdomen.  Feeling faint or passing out. DIAGNOSIS  Since most unruptured abdominal aortic aneurysms have no symptoms, they are often discovered during diagnostic exams for other conditions. An aneurysm may be found during the following procedures:  Ultrasonography (A one-time screening for abdominal aortic aneurysm by ultrasonography is also recommended for all men aged 65-75 years who have ever smoked).  X-ray exams.  A computed tomography (CT).  Magnetic resonance imaging (MRI).  Angiography or arteriography. TREATMENT  Treatment of an abdominal aortic aneurysm depends on the size of your aneurysm, your age, and risk factors for rupture. Medication to control blood pressure and pain may be used to manage aneurysms smaller than 6 cm. Regular monitoring for enlargement may be recommended by your caregiver if:  The aneurysm is 3-4 cm in size (an annual ultrasonography may be recommended).  The aneurysm is 4-4.5 cm in size (an ultrasonography every 6 months may be recommended).  The aneurysm is larger than 4.5 cm in   size (your caregiver may ask that you be examined by a vascular surgeon). If your aneurysm is larger than 6 cm, surgical repair may be recommended. There are two main methods for repair of an aneurysm:   Endovascular  repair (a minimally invasive surgery). This is done most often.  Open repair. This method is used if an endovascular repair is not possible. Document Released: 11/05/2004 Document Revised: 05/23/2012 Document Reviewed: 02/26/2012 ExitCare Patient Information 2015 [employer]. This information is not intended to replace advice given to you by your health care provider. Make sure you discuss any questions you have with your health care provider.  

## 2014-10-08 DIAGNOSIS — M1A09X0 Idiopathic chronic gout, multiple sites, without tophus (tophi): Secondary | ICD-10-CM | POA: Diagnosis not present

## 2014-11-08 DIAGNOSIS — Z79899 Other long term (current) drug therapy: Secondary | ICD-10-CM | POA: Diagnosis not present

## 2014-11-08 DIAGNOSIS — M79672 Pain in left foot: Secondary | ICD-10-CM | POA: Diagnosis not present

## 2014-11-08 DIAGNOSIS — M1A09X0 Idiopathic chronic gout, multiple sites, without tophus (tophi): Secondary | ICD-10-CM | POA: Diagnosis not present

## 2014-11-12 DIAGNOSIS — H401131 Primary open-angle glaucoma, bilateral, mild stage: Secondary | ICD-10-CM | POA: Diagnosis not present

## 2014-11-19 DIAGNOSIS — M4000 Postural kyphosis, site unspecified: Secondary | ICD-10-CM | POA: Diagnosis not present

## 2014-11-19 DIAGNOSIS — I10 Essential (primary) hypertension: Secondary | ICD-10-CM | POA: Diagnosis not present

## 2014-11-19 DIAGNOSIS — M545 Low back pain: Secondary | ICD-10-CM | POA: Diagnosis not present

## 2014-11-22 DIAGNOSIS — H52223 Regular astigmatism, bilateral: Secondary | ICD-10-CM | POA: Diagnosis not present

## 2014-11-22 DIAGNOSIS — H5201 Hypermetropia, right eye: Secondary | ICD-10-CM | POA: Diagnosis not present

## 2014-11-22 DIAGNOSIS — H401134 Primary open-angle glaucoma, bilateral, indeterminate stage: Secondary | ICD-10-CM | POA: Diagnosis not present

## 2014-11-22 DIAGNOSIS — H524 Presbyopia: Secondary | ICD-10-CM | POA: Diagnosis not present

## 2014-12-06 DIAGNOSIS — R7302 Impaired glucose tolerance (oral): Secondary | ICD-10-CM | POA: Diagnosis not present

## 2014-12-06 DIAGNOSIS — E785 Hyperlipidemia, unspecified: Secondary | ICD-10-CM | POA: Diagnosis not present

## 2014-12-06 DIAGNOSIS — I10 Essential (primary) hypertension: Secondary | ICD-10-CM | POA: Diagnosis not present

## 2014-12-06 DIAGNOSIS — M109 Gout, unspecified: Secondary | ICD-10-CM | POA: Diagnosis not present

## 2014-12-06 DIAGNOSIS — Z125 Encounter for screening for malignant neoplasm of prostate: Secondary | ICD-10-CM | POA: Diagnosis not present

## 2014-12-13 DIAGNOSIS — Z23 Encounter for immunization: Secondary | ICD-10-CM | POA: Diagnosis not present

## 2014-12-13 DIAGNOSIS — I10 Essential (primary) hypertension: Secondary | ICD-10-CM | POA: Diagnosis not present

## 2014-12-13 DIAGNOSIS — E785 Hyperlipidemia, unspecified: Secondary | ICD-10-CM | POA: Diagnosis not present

## 2014-12-13 DIAGNOSIS — Z6823 Body mass index (BMI) 23.0-23.9, adult: Secondary | ICD-10-CM | POA: Diagnosis not present

## 2014-12-13 DIAGNOSIS — Z1389 Encounter for screening for other disorder: Secondary | ICD-10-CM | POA: Diagnosis not present

## 2014-12-13 DIAGNOSIS — Z0000 Encounter for general adult medical examination without abnormal findings: Secondary | ICD-10-CM | POA: Diagnosis not present

## 2014-12-13 DIAGNOSIS — N401 Enlarged prostate with lower urinary tract symptoms: Secondary | ICD-10-CM | POA: Diagnosis not present

## 2014-12-13 DIAGNOSIS — R7302 Impaired glucose tolerance (oral): Secondary | ICD-10-CM | POA: Diagnosis not present

## 2014-12-13 DIAGNOSIS — M19049 Primary osteoarthritis, unspecified hand: Secondary | ICD-10-CM | POA: Diagnosis not present

## 2014-12-13 DIAGNOSIS — N183 Chronic kidney disease, stage 3 (moderate): Secondary | ICD-10-CM | POA: Diagnosis not present

## 2014-12-13 DIAGNOSIS — I129 Hypertensive chronic kidney disease with stage 1 through stage 4 chronic kidney disease, or unspecified chronic kidney disease: Secondary | ICD-10-CM | POA: Diagnosis not present

## 2015-01-08 DIAGNOSIS — M1A09X0 Idiopathic chronic gout, multiple sites, without tophus (tophi): Secondary | ICD-10-CM | POA: Diagnosis not present

## 2015-03-26 ENCOUNTER — Encounter: Payer: Self-pay | Admitting: Family

## 2015-04-01 ENCOUNTER — Encounter: Payer: Self-pay | Admitting: Family

## 2015-04-01 ENCOUNTER — Ambulatory Visit (INDEPENDENT_AMBULATORY_CARE_PROVIDER_SITE_OTHER): Payer: Medicare Other | Admitting: Family

## 2015-04-01 ENCOUNTER — Ambulatory Visit (HOSPITAL_COMMUNITY)
Admission: RE | Admit: 2015-04-01 | Discharge: 2015-04-01 | Disposition: A | Discharge status: Home / Self Care | Payer: Medicare Other | Source: Ambulatory Visit | Attending: Family | Admitting: Family

## 2015-04-01 VITALS — BP 150/86 | HR 65 | Ht 67.75 in | Wt 153.9 lb

## 2015-04-01 DIAGNOSIS — I714 Abdominal aortic aneurysm, without rupture, unspecified: Secondary | ICD-10-CM

## 2015-04-01 DIAGNOSIS — Z87891 Personal history of nicotine dependence: Secondary | ICD-10-CM | POA: Diagnosis not present

## 2015-04-01 NOTE — Patient Instructions (Signed)
Abdominal Aortic Aneurysm An aneurysm is a weakened or damaged part of an artery [place] that bulges from the normal force of blood pumping through the body. An abdominal aortic aneurysm is an aneurysm that occurs in the lower part of the aorta, the main artery of the body.  The major concern with an abdominal aortic aneurysm is that it can enlarge and burst (rupture) or blood can flow between the layers of the [place] of the aorta through a tear (aorticdissection). Both of these conditions can cause bleeding inside the body and can be life threatening unless diagnosed and treated promptly. CAUSES  The exact cause of an abdominal aortic aneurysm is unknown. Some contributing factors are:   A hardening of the arteries caused by the buildup of fat and other substances in the lining of a blood vessel (arteriosclerosis).  Inflammation of the walls of an artery (arteritis).   Connective tissue diseases, such as Marfan syndrome.   Abdominal trauma.   An infection, such as syphilis or staphylococcus, in the [place] of the aorta (infectious aortitis) caused by bacteria. RISK FACTORS  Risk factors that contribute to an abdominal aortic aneurysm may include:  Age older than 60 years.   High blood pressure (hypertension).  Male gender.  Ethnicity (white race).  Obesity.  Family history of aneurysm (first degree relatives only).  Tobacco use. PREVENTION  The following healthy lifestyle habits may help decrease your risk of abdominal aortic aneurysm:  Quitting smoking. Smoking can raise your blood pressure and cause arteriosclerosis.  Limiting or avoiding alcohol.  Keeping your blood pressure, blood sugar level, and cholesterol levels within normal limits.  Decreasing your salt intake. In somepeople, too much salt can raise blood pressure and increase your risk of abdominal aortic aneurysm.  Eating a diet low in saturated fats and cholesterol.  Increasing your fiber intake by including  whole grains, vegetables, and fruits in your diet. Eating these foods may help lower blood pressure.  Maintaining a healthy weight.  Staying physically active and exercising regularly. SYMPTOMS  The symptoms of abdominal aortic aneurysm may vary depending on the size and rate of growth of the aneurysm.Most grow slowly and do not have any symptoms. When symptoms do occur, they may include:  Pain (abdomen, side, lower back, or groin). The pain may vary in intensity. A sudden onset of severe pain may indicate that the aneurysm has ruptured.  Feeling full after eating only small amounts of food.  Nausea or vomiting or both.  Feeling a pulsating lump in the abdomen.  Feeling faint or passing out. DIAGNOSIS  Since most unruptured abdominal aortic aneurysms have no symptoms, they are often discovered during diagnostic exams for other conditions. An aneurysm may be found during the following procedures:  Ultrasonography (A one-time screening for abdominal aortic aneurysm by ultrasonography is also recommended for all men aged 65-75 years who have ever smoked).  X-ray exams.  A computed tomography (CT).  Magnetic resonance imaging (MRI).  Angiography or arteriography. TREATMENT  Treatment of an abdominal aortic aneurysm depends on the size of your aneurysm, your age, and risk factors for rupture. Medication to control blood pressure and pain may be used to manage aneurysms smaller than 6 cm. Regular monitoring for enlargement may be recommended by your caregiver if:  The aneurysm is 3-4 cm in size (an annual ultrasonography may be recommended).  The aneurysm is 4-4.5 cm in size (an ultrasonography every 6 months may be recommended).  The aneurysm is larger than 4.5 cm in   size (your caregiver may ask that you be examined by a vascular surgeon). If your aneurysm is larger than 6 cm, surgical repair may be recommended. There are two main methods for repair of an aneurysm:   Endovascular  repair (a minimally invasive surgery). This is done most often.  Open repair. This method is used if an endovascular repair is not possible.   This information is not intended to replace advice given to you by your health care provider. Make sure you discuss any questions you have with your health care provider.   Document Released: 11/05/2004 Document Revised: 05/23/2012 Document Reviewed: 02/26/2012 Elsevier Interactive Patient Education 2016 Elsevier Inc.  

## 2015-04-01 NOTE — Addendum Note (Signed)
Addended by: Dorthula Rue L on: 04/01/2015 10:12 AM   Modules accepted: Orders

## 2015-04-01 NOTE — Progress Notes (Signed)
[facility] [place]  Established Abdominal Aortic Aneurysm  History of Present Illness  Justin [name] is a 82 y.o. ([birth year]) male patient of Dr. Trula Slade who presents with chief complaint: follow up for AAA. Previous studies demonstrate an AAA, measuring 4.65 cm on 03/05/14. His AAA was an incidental finding from imaging evaluating his spine issues. The patient does not have abdominal pain.  He had several lumbar spine surgeries and rarely has back pain, denies any new back pain. The patient denies symptoms referable to claudication in legs with walking. He does have left ankle tight feeling most of the time and left foot numbness. The patient denies history of stroke or TIA symptoms.  He did not take his blood pressure medication this AM.   He takes a daily 81 mg ASA, daily beta blocker, does not take a statin, states he does not have cholesterol problem.   Pt Diabetic: No Pt smoker: former smoker, quit in 1980   Past Medical History  Diagnosis Date  . Hiatal hernia   . AAA (abdominal aortic aneurysm) ([place])     "we've known about it since ~ 2000"  . Hypertension     Takes lisinopril/hctz  . Bronchitis     hx of; "once or twice in my life" (04/21/11)  . GERD (gastroesophageal reflux disease)   . Stomach ulcer     "years ago; treated w/diet"   . Migraine     "left ~ 1980's"  . Chronic lower back pain 1995  . Gout of big toe     "h/o in both"  . Atrial fibrillation ([place])     a. 04/2011 post op  . Lumbar stenosis     a. s/p  anterolateral decompression and lateral plating 04/21/2011  . Arthritis     in back and hands:  Gout  . Lumbar spondylosis   . Chronic kidney disease    Past Surgical History  Procedure Laterality Date  . Prostate surgery    . Anterior lat lumbar fusion  04/21/11    w/PEEK cage, allograft, and lateral plate (r)  . Tonsillectomy      "as a child"  . Appendectomy  1946  . Back surgery  04/21/11    "today makes the 5th time"   . Cataract extraction, bilateral    . Anterior lat lumbar fusion  04/21/2011    Procedure: ANTERIOR LATERAL LUMBAR FUSION 1 LEVEL;  Surgeon: Erline Levine, MD;  Location: [facility];  Service: Neurosurgery;  Laterality: Right;  RIGHT Lumbar three-four anterolateral decompression with fusion and lateral plating  . Eye surgery Bilateral   . Spine surgery      X's 5    Social History Social History   Social History  . Marital Status: Married    Spouse Name: N/A  . Number of Children: N/A  . Years of Education: N/A   Occupational History  . Not on file.   Social History Main Topics  . Smoking status: Former Smoker -- 1.00 packs/day for 20 years    Types: Cigarettes    Quit date: 02/10/1980  . Smokeless tobacco: Never Used  . Alcohol Use: 0.0 oz/week     Comment: 04/21/11 'bottle of wine/year"  . Drug Use: No  . Sexual Activity: Yes   Other Topics Concern  . Not on file   Social History Narrative   Lives in [place].  Retired.  Activity limited by back pain.   Family History Family  History  Problem Relation Age of Onset  . Alzheimer's disease Mother     died @ 66  . COPD Father     died @ 65  . Cancer Father   . Hypertension Father   . Heart disease Father   . Stroke Sister   . Cancer Brother     stomach  . Heart disease Brother     Aneurysm  . Anesthesia problems Neg Hx   . Hypotension Neg Hx   . Malignant hyperthermia Neg Hx   . Pseudochol deficiency Neg Hx   . Hypertension Son   . Heart disease Son     Aneurysm  . Hypertension Son   . Heart disease Son     Aneurysm    Current Outpatient Prescriptions on File Prior to Visit  Medication Sig Dispense Refill  . allopurinol (ZYLOPRIM) 300 MG tablet daily. To prevent Gout  3  . aspirin EC 81 MG tablet Take 81 mg by mouth daily.     . brimonidine (ALPHAGAN) 0.15 % ophthalmic solution     . COLCRYS 0.6 MG tablet as needed. Gout    . dorzolamide-timolol (COSOPT) 22.3-6.8 MG/ML ophthalmic  solution Place 1 drop into both eyes 2 (two) times daily.     Marland Kitchen dutasteride (AVODART) 0.5 MG capsule Take 0.5 mg by mouth 2 (two) times a week.    Marland Kitchen lisinopril-hydrochlorothiazide (PRINZIDE,ZESTORETIC) 20-12.5 MG per tablet Take 1 tablet by mouth daily.    Marland Kitchen LUMIGAN 0.01 % SOLN Place into both eyes daily.   3  . ULORIC 80 MG TABS daily. New medication for Gout  3  . metoprolol tartrate (LOPRESSOR) 25 MG tablet Take 1 tablet (25 mg total) by mouth 2 (two) times daily. 60 tablet 3   No current facility-administered medications on file prior to visit.   Allergies  Allergen Reactions  . Demerol Anaphylaxis  . Meperidine Anaphylaxis    ROS: See HPI for pertinent positives and negatives.  Physical Examination  Filed Vitals:   04/01/15 0830 04/01/15 0832  BP: 150/89 150/86  Pulse: 65   Height: 5' 7.75" (1.721 m)   Weight: 153 lb 14.4 oz (69.809 kg)   SpO2: 99%    Body mass index is 23.57 kg/(m^2).  General: A&O x 3, WD male.  Pulmonary: Sym exp, good air movt, CTAB, no rales, rhonchi, or wheezing.   Cardiac: RRR, Nl S1, S2, no murmur detected.  Carotid Bruits Left Right   Negative Negative  Aorta is not palpable Radial pulses are are 2+ and =   VASCULAR EXAM:     LE Pulses LEFT RIGHT   FEMORAL palpable palpable   POPLITEAL not palpable  not palpable   POSTERIOR TIBIAL  palpable   palpable    DORSALIS PEDIS  ANTERIOR TIBIAL not palpable  not palpable     Gastrointestinal: soft, NTND, -G/R, - HSM, reducible moderate sized asymptomatic ventral hernia when lying down or sitting up, - CVAT B.  Musculoskeletal: M/S 5/5 throughout, Extremities without ischemic changes.   Neurologic: CN 2-12 intact, Pain and light touch intact in  extremities, Motor exam as listed above.                 Non-Invasive Vascular Imaging  AAA Duplex (04/01/2015) ABDOMINAL AORTA DUPLEX EVALUATION    INDICATION: Evaluation of abdominal aorta.    PREVIOUS INTERVENTION(S):     DUPLEX EXAM:     LOCATION DIAMETER AP (cm) DIAMETER TRANSVERSE (cm) VELOCITIES (cm/sec)  Aorta Proximal Not Visualized  Not Visualized    Aorta Mid 4.85 4.72   Aorta Distal 4.00 3.79   Right Common Iliac Artery 1.36 1.46 80  Left Common Iliac Artery 1.26  74    Previous max aortic diameter:  4.20 x 4.32 Date: 09/18/2014     ADDITIONAL FINDINGS: The aorta and iliac arteries are difficult to interrogate due to overlying bowel gas. The mid to distal abdominal aorta is tortuous with intraluminal thrombus noted.    IMPRESSION: Patent abdominal aortic aneurysm measuring approximately 4.85 x 4.72 cm in diameter, measurement may be slightly skewed due to vessel tortuosity.     Compared to the previous exam:  Slight increase in the maximum diameter when compared to the last exam.      Medical Decision Making  The patient is a 82 y.o. male who presents with asymptomatic AAA with slight increase in size.   Based on this patient's exam and diagnostic studies, the patient will follow up in 6 months  with the following studies: AAA duplex.  Consideration for repair of AAA would be made when the size is 5.5 cm, growth > 1 cm/yr, and symptomatic status.  I emphasized the importance of maximal medical management including strict control of blood pressure, blood glucose, and lipid levels, antiplatelet agents, obtaining regular exercise, and continued cessation of smoking.   The patient was given information about AAA including signs, symptoms, treatment, and how to minimize the risk of enlargement and rupture of aneurysms.    The patient was advised to call 911 should the patient experience sudden onset abdominal or back pain.   Thank you for allowing [place] to  participate in this patient's care.  Clemon Chambers, RN, MSN, FNP-C [facility] [place] Office: [facility] Physician: Trula Slade  04/01/2015, 8:51 AM

## 2015-04-04 DIAGNOSIS — Z6823 Body mass index (BMI) 23.0-23.9, adult: Secondary | ICD-10-CM | POA: Diagnosis not present

## 2015-04-04 DIAGNOSIS — L309 Dermatitis, unspecified: Secondary | ICD-10-CM | POA: Diagnosis not present

## 2015-04-15 DIAGNOSIS — H401131 Primary open-angle glaucoma, bilateral, mild stage: Secondary | ICD-10-CM | POA: Diagnosis not present

## 2015-05-08 DIAGNOSIS — H401131 Primary open-angle glaucoma, bilateral, mild stage: Secondary | ICD-10-CM | POA: Diagnosis not present

## 2015-05-08 DIAGNOSIS — H532 Diplopia: Secondary | ICD-10-CM | POA: Diagnosis not present

## 2015-05-08 DIAGNOSIS — H52203 Unspecified astigmatism, bilateral: Secondary | ICD-10-CM | POA: Diagnosis not present

## 2015-05-08 DIAGNOSIS — Z961 Presence of intraocular lens: Secondary | ICD-10-CM | POA: Diagnosis not present

## 2015-05-08 DIAGNOSIS — H5203 Hypermetropia, bilateral: Secondary | ICD-10-CM | POA: Diagnosis not present

## 2015-05-08 DIAGNOSIS — H5051 Esophoria: Secondary | ICD-10-CM | POA: Diagnosis not present

## 2015-05-29 DIAGNOSIS — H401131 Primary open-angle glaucoma, bilateral, mild stage: Secondary | ICD-10-CM | POA: Diagnosis not present

## 2015-06-17 DIAGNOSIS — M19049 Primary osteoarthritis, unspecified hand: Secondary | ICD-10-CM | POA: Diagnosis not present

## 2015-06-17 DIAGNOSIS — M109 Gout, unspecified: Secondary | ICD-10-CM | POA: Diagnosis not present

## 2015-06-17 DIAGNOSIS — E784 Other hyperlipidemia: Secondary | ICD-10-CM | POA: Diagnosis not present

## 2015-06-17 DIAGNOSIS — D692 Other nonthrombocytopenic purpura: Secondary | ICD-10-CM | POA: Diagnosis not present

## 2015-06-17 DIAGNOSIS — Z1389 Encounter for screening for other disorder: Secondary | ICD-10-CM | POA: Diagnosis not present

## 2015-06-17 DIAGNOSIS — I129 Hypertensive chronic kidney disease with stage 1 through stage 4 chronic kidney disease, or unspecified chronic kidney disease: Secondary | ICD-10-CM | POA: Diagnosis not present

## 2015-06-17 DIAGNOSIS — I714 Abdominal aortic aneurysm, without rupture: Secondary | ICD-10-CM | POA: Diagnosis not present

## 2015-06-17 DIAGNOSIS — Z6823 Body mass index (BMI) 23.0-23.9, adult: Secondary | ICD-10-CM | POA: Diagnosis not present

## 2015-06-17 DIAGNOSIS — I739 Peripheral vascular disease, unspecified: Secondary | ICD-10-CM | POA: Diagnosis not present

## 2015-06-17 DIAGNOSIS — N401 Enlarged prostate with lower urinary tract symptoms: Secondary | ICD-10-CM | POA: Diagnosis not present

## 2015-06-17 DIAGNOSIS — R7302 Impaired glucose tolerance (oral): Secondary | ICD-10-CM | POA: Diagnosis not present

## 2015-06-17 DIAGNOSIS — N183 Chronic kidney disease, stage 3 (moderate): Secondary | ICD-10-CM | POA: Diagnosis not present

## 2015-07-01 DIAGNOSIS — H401131 Primary open-angle glaucoma, bilateral, mild stage: Secondary | ICD-10-CM | POA: Diagnosis not present

## 2015-07-01 DIAGNOSIS — H532 Diplopia: Secondary | ICD-10-CM | POA: Diagnosis not present

## 2015-07-09 DIAGNOSIS — M1A09X0 Idiopathic chronic gout, multiple sites, without tophus (tophi): Secondary | ICD-10-CM | POA: Diagnosis not present

## 2015-07-09 DIAGNOSIS — I10 Essential (primary) hypertension: Secondary | ICD-10-CM | POA: Diagnosis not present

## 2015-08-12 DIAGNOSIS — D485 Neoplasm of uncertain behavior of skin: Secondary | ICD-10-CM | POA: Diagnosis not present

## 2015-08-12 DIAGNOSIS — L570 Actinic keratosis: Secondary | ICD-10-CM | POA: Diagnosis not present

## 2015-08-12 DIAGNOSIS — L2084 Intrinsic (allergic) eczema: Secondary | ICD-10-CM | POA: Diagnosis not present

## 2015-08-12 DIAGNOSIS — C4442 Squamous cell carcinoma of skin of scalp and neck: Secondary | ICD-10-CM | POA: Diagnosis not present

## 2015-08-16 DIAGNOSIS — C44629 Squamous cell carcinoma of skin of left upper limb, including shoulder: Secondary | ICD-10-CM | POA: Diagnosis not present

## 2015-08-16 DIAGNOSIS — D485 Neoplasm of uncertain behavior of skin: Secondary | ICD-10-CM | POA: Diagnosis not present

## 2015-08-16 DIAGNOSIS — C4442 Squamous cell carcinoma of skin of scalp and neck: Secondary | ICD-10-CM | POA: Diagnosis not present

## 2015-09-05 DIAGNOSIS — C44629 Squamous cell carcinoma of skin of left upper limb, including shoulder: Secondary | ICD-10-CM | POA: Diagnosis not present

## 2015-09-05 DIAGNOSIS — C4442 Squamous cell carcinoma of skin of scalp and neck: Secondary | ICD-10-CM | POA: Diagnosis not present

## 2015-09-30 ENCOUNTER — Ambulatory Visit: Payer: Medicare Other | Admitting: Family

## 2015-09-30 ENCOUNTER — Other Ambulatory Visit (HOSPITAL_COMMUNITY): Payer: Medicare Other

## 2015-10-29 ENCOUNTER — Encounter: Payer: Self-pay | Admitting: Family

## 2015-11-04 ENCOUNTER — Ambulatory Visit (HOSPITAL_COMMUNITY)
Admission: RE | Admit: 2015-11-04 | Discharge: 2015-11-04 | Disposition: A | Discharge status: Home / Self Care | Payer: Medicare Other | Source: Ambulatory Visit | Attending: Family | Admitting: Family

## 2015-11-04 ENCOUNTER — Encounter: Payer: Self-pay | Admitting: Family

## 2015-11-04 ENCOUNTER — Ambulatory Visit (INDEPENDENT_AMBULATORY_CARE_PROVIDER_SITE_OTHER): Payer: Medicare Other | Admitting: Family

## 2015-11-04 VITALS — BP 184/92 | HR 73 | Temp 97.3°F | Resp 16 | Ht 68.0 in | Wt 152.0 lb

## 2015-11-04 DIAGNOSIS — Z87891 Personal history of nicotine dependence: Secondary | ICD-10-CM | POA: Diagnosis not present

## 2015-11-04 DIAGNOSIS — I714 Abdominal aortic aneurysm, without rupture, unspecified: Secondary | ICD-10-CM

## 2015-11-04 DIAGNOSIS — I10 Essential (primary) hypertension: Secondary | ICD-10-CM | POA: Diagnosis not present

## 2015-11-04 NOTE — Progress Notes (Signed)
Vitals:   11/04/15 0901  BP: (!) 174/91  Pulse: 76  Resp: 16  Temp: 97.3 F (36.3 C)  SpO2: 99%  Weight: 152 lb (68.9 kg)  Height: 5\' 8"  (1.727 m)

## 2015-11-04 NOTE — Patient Instructions (Addendum)
Abdominal Aortic Aneurysm An aneurysm is a weakened or damaged part of an artery Rauber that bulges from the normal force of blood pumping through the body. An abdominal aortic aneurysm is an aneurysm that occurs in the lower part of the aorta, the main artery of the body.  The major concern with an abdominal aortic aneurysm is that it can enlarge and burst (rupture) or blood can flow between the layers of the Stre[place] of the aorta through a tear (aorticdissection). Both of these conditions can cause bleeding inside the body and can be life threatening unless diagnosed and treated promptly. CAUSES  The exact cause of an abdominal aortic aneurysm is unknown. Some contributing factors are:   A hardening of the arteries caused by the buildup of fat and other substances in the lining of a blood vessel (arteriosclerosis).  Inflammation of the walls of an artery (arteritis).   Connective tissue diseases, such as Marfan syndrome.   Abdominal trauma.   An infection, such as syphilis or staphylococcus, in the Khun of the aorta (infectious aortitis) caused by bacteria. RISK FACTORS  Risk factors that contribute to an abdominal aortic aneurysm may include:  Age older than 60 years.   High blood pressure (hypertension).  Male gender.  Ethnicity (white race).  Obesity.  Family history of aneurysm (first degree relatives only).  Tobacco use. PREVENTION  The following healthy lifestyle habits may help decrease your risk of abdominal aortic aneurysm:  Quitting smoking. Smoking can raise your blood pressure and cause arteriosclerosis.  Limiting or avoiding alcohol.  Keeping your blood pressure, blood sugar level, and cholesterol levels within normal limits.  Decreasing your salt intake. In somepeople, too much salt can raise blood pressure and increase your risk of abdominal aortic aneurysm.  Eating a diet low in saturated fats and cholesterol.  Increasing your fiber intake by including  whole grains, vegetables, and fruits in your diet. Eating these foods may help lower blood pressure.  Maintaining a healthy weight.  Staying physically active and exercising regularly. SYMPTOMS  The symptoms of abdominal aortic aneurysm may vary depending on the size and rate of growth of the aneurysm.Most grow slowly and do not have any symptoms. When symptoms do occur, they may include:  Pain (abdomen, side, lower back, or groin). The pain may vary in intensity. A sudden onset of severe pain may indicate that the aneurysm has ruptured.  Feeling full after eating only small amounts of food.  Nausea or vomiting or both.  Feeling a pulsating lump in the abdomen.  Feeling faint or passing out. DIAGNOSIS  Since most unruptured abdominal aortic aneurysms have no symptoms, they are often discovered during diagnostic exams for other conditions. An aneurysm may be found during the following procedures:  Ultrasonography (A one-time screening for abdominal aortic aneurysm by ultrasonography is also recommended for all men aged 65-75 years who have ever smoked).  X-ray exams.  A computed tomography (CT).  Magnetic resonance imaging (MRI).  Angiography or arteriography. TREATMENT  Treatment of an abdominal aortic aneurysm depends on the size of your aneurysm, your age, and risk factors for rupture. Medication to control blood pressure and pain may be used to manage aneurysms smaller than 6 cm. Regular monitoring for enlargement may be recommended by your caregiver if:  The aneurysm is 3-4 cm in size (an annual ultrasonography may be recommended).  The aneurysm is 4-4.5 cm in size (an ultrasonography every 6 months may be recommended).  The aneurysm is larger than 4.5 cm in   size (your caregiver may ask that you be examined by a vascular surgeon). If your aneurysm is larger than 6 cm, surgical repair may be recommended. There are two main methods for repair of an aneurysm:   Endovascular  repair (a minimally invasive surgery). This is done most often.  Open repair. This method is used if an endovascular repair is not possible.   This information is not intended to replace advice given to you by your health care provider. Make sure you discuss any questions you have with your health care provider.   Document Released: 11/05/2004 Document Revised: 05/23/2012 Document Reviewed: 02/26/2012 Elsevier Interactive Patient Education 2016 Elsevier Inc.    Before your next abdominal ultrasound:  Take two Extra-Strength Gas-X capsules at bedtime the night before the test. Take another two Extra-Strength Gas-X capsules 3 hours before the test.   

## 2015-11-04 NOTE — Progress Notes (Signed)
[facility] [place]   CC: Follow up Abdominal Aortic Aneurysm  History of Present Illness  Justin [name] is a 82 y.o. (1923/09/19) male patient of Dr. Trula Slade who presents with chief complaint: follow up for AAA. Previous studies demonstrate an AAA, measuring 4.65 cm on 03/05/14. His AAA was an incidental finding from imaging evaluating his spine issues. The patient does not have abdominal pain.  He had several lumbar spine surgeries and rarely has back pain, denies any new back pain. The patient denies symptoms referable to claudication in legs with walking. He does have left ankle tight feeling most of the time and left foot numbness. The patient denies history of stroke or TIA symptoms.  He did not take his blood pressure medication this AM.   He takes a daily 81 mg ASA, daily beta blocker, does not take a statin, states he does not have cholesterol problem.   Pt Diabetic: No Pt smoker: former smoker, quit in 1980    Past Medical History:  Diagnosis Date  . AAA (abdominal aortic aneurysm) ([place])    "we've known about it since ~ 2000"  . Arthritis    in back and hands:  Gout  . Atrial fibrillation ([place])    a. 04/2011 post op  . Bronchitis    hx of; "once or twice in my life" (04/21/11)  . Chronic kidney disease   . Chronic lower back pain 1995  . GERD (gastroesophageal reflux disease)   . Gout of big toe    "h/o in both"  . Hiatal hernia   . Hypertension    Takes lisinopril/hctz  . Lumbar spondylosis   . Lumbar stenosis    a. s/p  anterolateral decompression and lateral plating 04/21/2011  . Migraine    "left ~ 1980's"  . Stomach ulcer    "years ago; treated w/diet"    Past Surgical History:  Procedure Laterality Date  . ANTERIOR LAT LUMBAR FUSION  04/21/11   w/PEEK cage, allograft, and lateral plate (r)  . ANTERIOR LAT LUMBAR FUSION  04/21/2011   Procedure: ANTERIOR LATERAL LUMBAR FUSION 1 LEVEL;  Surgeon: Erline Levine, MD;  Location: [facility]  NEURO ORS;  Service: Neurosurgery;  Laterality: Right;  RIGHT Lumbar three-four anterolateral decompression with fusion and lateral plating  . APPENDECTOMY  1946  . BACK SURGERY  04/21/11   "today makes the 5th time"  . CATARACT EXTRACTION, BILATERAL    . EYE SURGERY Bilateral   . PROSTATE SURGERY    . SPINE SURGERY     X's 5   . TONSILLECTOMY     "as a child"   Social History Social History   Social History  . Marital status: Married    Spouse name: N/A  . Number of children: N/A  . Years of education: N/A   Occupational History  . Not on file.   Social History Main Topics  . Smoking status: Former Smoker    Packs/day: 1.00    Years: 20.00    Types: Cigarettes    Quit date: 02/10/1980  . Smokeless tobacco: Never Used  . Alcohol use 0.0 oz/week     Comment: 04/21/11 'bottle of wine/year"  . Drug use: No  . Sexual activity: Yes   Other Topics Concern  . Not on file   Social History Narrative   Lives in [place].  Retired.  Activity limited by back pain.   Family History Family History  Problem Relation Age of  Onset  . Alzheimer's disease Mother     died @ 19  . COPD Father     died @ 25  . Cancer Father   . Hypertension Father   . Heart disease Father   . Stroke Sister   . Cancer Brother     stomach  . Heart disease Brother     Aneurysm  . Anesthesia problems Neg Hx   . Hypotension Neg Hx   . Malignant hyperthermia Neg Hx   . Pseudochol deficiency Neg Hx   . Hypertension Son   . Heart disease Son     Aneurysm  . Hypertension Son   . Heart disease Son     Aneurysm    Current Outpatient Prescriptions on File Prior to Visit  Medication Sig Dispense Refill  . allopurinol (ZYLOPRIM) 300 MG tablet daily. To prevent Gout  3  . aspirin EC 81 MG tablet Take 81 mg by mouth daily.     . brimonidine (ALPHAGAN) 0.15 % ophthalmic solution     . COLCRYS 0.6 MG tablet as needed. Gout    . dorzolamide-timolol (COSOPT) 22.3-6.8 MG/ML ophthalmic  solution Place 1 drop into both eyes 2 (two) times daily.     Marland Kitchen dutasteride (AVODART) 0.5 MG capsule Take 0.5 mg by mouth 2 (two) times a week.    Marland Kitchen lisinopril-hydrochlorothiazide (PRINZIDE,ZESTORETIC) 20-12.5 MG per tablet Take 1 tablet by mouth daily.    Marland Kitchen LUMIGAN 0.01 % SOLN Place into both eyes daily.   3  . ULORIC 80 MG TABS daily. New medication for Gout  3  . metoprolol tartrate (LOPRESSOR) 25 MG tablet Take 1 tablet (25 mg total) by mouth 2 (two) times daily. 60 tablet 3   No current facility-administered medications on file prior to visit.    Allergies  Allergen Reactions  . Demerol Anaphylaxis  . Meperidine Anaphylaxis    ROS: See HPI for pertinent positives and negatives.  Physical Examination  Vitals:   11/04/15 0901 11/04/15 0903  BP: (!) 174/91 (!) 184/92  Pulse: 76 73  Resp: 16   Temp: 97.3 F (36.3 C)   SpO2: 99%   Weight: 152 lb (68.9 kg)   Height: 5\' 8"  (1.727 m)    Body mass index is 23.11 kg/m.  General: A&O x 3, WD male.  Pulmonary: Sym exp, respirations are non labored, good air movt, CTAB, no rales, rhonchi, or wheezing.   Cardiac: RRR, Nl S1, S2, no murmur detected.  Carotid Bruits Left Right   Negative Negative  Aorta is not palpable Radial pulses are are 2+ and =   VASCULAR EXAM:     LE Pulses LEFT RIGHT   FEMORAL palpable palpable   POPLITEAL not palpable  not palpable   POSTERIOR TIBIAL  palpable   palpable    DORSALIS PEDIS  ANTERIOR TIBIAL not palpable  not palpable     Gastrointestinal: soft, NTND, -G/R, - HSM, reducible moderate sized asymptomatic ventral hernia when lying down or sitting up, - CVAT B.  Musculoskeletal: M/S 5/5 throughout, Extremities without ischemic changes.    Neurologic: CN 2-12 intact except is hard of hearing, mostly corrected with bilateral hearing aids, Pain and light touch intact in extremities, Motor exam as listed above.   Non-Invasive Vascular Imaging  AAA Duplex (11/04/2015)  Previous size: 4.85 cm (Date: 04/01/15)  Current size:  4.99 cm (Date: 11/04/15), based on limited visualization due to overlying bowel gas, common iliac arteries not visualized.  Medical Decision Making  The  patient is a 82 y.o. male who presents with asymptomatic AAA with slight increase in size, to 4.99 cm today, compared to 4.85 cm, seven months prior, based on limited visualization. .  I advised him to see his PCP as soon as possible re his uncontrolled hypertension; he states he did not yet take his hypertension medication yet today; states he will take it as soon as he gets home. He denies headache, denies dizziness, denies chest pain, denies dyspnea.   Based on this patient's exam and diagnostic studies, the patient will follow up in 6 months  with the following studies: AAA duplex.  Consideration for repair of AAA would be made when the size is 5.5 cm, growth > 1 cm/yr, and symptomatic status.  I emphasized the importance of maximal medical management including strict control of blood pressure, blood glucose, and lipid levels, antiplatelet agents, obtaining regular exercise, and continued cessation of smoking.   The patient was given information about AAA including signs, symptoms, treatment, and how to minimize the risk of enlargement and rupture of aneurysms.    The patient was advised to call 911 should the patient experience sudden onset abdominal or back pain.   Thank you for allowing [place] to participate in this patient's care.  Clemon Chambers, RN, MSN, FNP-C [facility] [place] Office: [facility] Physician: Trula Slade  11/04/2015, 9:14 AM

## 2015-11-06 DIAGNOSIS — Z23 Encounter for immunization: Secondary | ICD-10-CM | POA: Diagnosis not present

## 2015-11-06 DIAGNOSIS — I10 Essential (primary) hypertension: Secondary | ICD-10-CM | POA: Diagnosis not present

## 2015-11-06 DIAGNOSIS — Z6822 Body mass index (BMI) 22.0-22.9, adult: Secondary | ICD-10-CM | POA: Diagnosis not present

## 2015-11-12 DIAGNOSIS — L259 Unspecified contact dermatitis, unspecified cause: Secondary | ICD-10-CM | POA: Diagnosis not present

## 2015-11-12 DIAGNOSIS — L2081 Atopic neurodermatitis: Secondary | ICD-10-CM | POA: Diagnosis not present

## 2015-11-12 DIAGNOSIS — L570 Actinic keratosis: Secondary | ICD-10-CM | POA: Diagnosis not present

## 2015-11-12 DIAGNOSIS — Z85828 Personal history of other malignant neoplasm of skin: Secondary | ICD-10-CM | POA: Diagnosis not present

## 2015-11-13 DIAGNOSIS — Z961 Presence of intraocular lens: Secondary | ICD-10-CM | POA: Diagnosis not present

## 2015-11-13 DIAGNOSIS — H401131 Primary open-angle glaucoma, bilateral, mild stage: Secondary | ICD-10-CM | POA: Diagnosis not present

## 2015-12-06 DIAGNOSIS — L570 Actinic keratosis: Secondary | ICD-10-CM | POA: Diagnosis not present

## 2015-12-06 DIAGNOSIS — L259 Unspecified contact dermatitis, unspecified cause: Secondary | ICD-10-CM | POA: Diagnosis not present

## 2015-12-06 DIAGNOSIS — L2081 Atopic neurodermatitis: Secondary | ICD-10-CM | POA: Diagnosis not present

## 2015-12-06 DIAGNOSIS — Z85828 Personal history of other malignant neoplasm of skin: Secondary | ICD-10-CM | POA: Diagnosis not present

## 2015-12-10 DIAGNOSIS — M109 Gout, unspecified: Secondary | ICD-10-CM | POA: Diagnosis not present

## 2015-12-10 DIAGNOSIS — I10 Essential (primary) hypertension: Secondary | ICD-10-CM | POA: Diagnosis not present

## 2015-12-10 DIAGNOSIS — R7302 Impaired glucose tolerance (oral): Secondary | ICD-10-CM | POA: Diagnosis not present

## 2015-12-10 DIAGNOSIS — Z125 Encounter for screening for malignant neoplasm of prostate: Secondary | ICD-10-CM | POA: Diagnosis not present

## 2015-12-10 DIAGNOSIS — E041 Nontoxic single thyroid nodule: Secondary | ICD-10-CM | POA: Diagnosis not present

## 2015-12-16 DIAGNOSIS — I10 Essential (primary) hypertension: Secondary | ICD-10-CM | POA: Diagnosis not present

## 2015-12-16 DIAGNOSIS — Z6823 Body mass index (BMI) 23.0-23.9, adult: Secondary | ICD-10-CM | POA: Diagnosis not present

## 2015-12-16 DIAGNOSIS — N401 Enlarged prostate with lower urinary tract symptoms: Secondary | ICD-10-CM | POA: Diagnosis not present

## 2015-12-16 DIAGNOSIS — M2550 Pain in unspecified joint: Secondary | ICD-10-CM | POA: Diagnosis not present

## 2015-12-16 DIAGNOSIS — N183 Chronic kidney disease, stage 3 (moderate): Secondary | ICD-10-CM | POA: Diagnosis not present

## 2015-12-16 DIAGNOSIS — M109 Gout, unspecified: Secondary | ICD-10-CM | POA: Diagnosis not present

## 2015-12-16 DIAGNOSIS — E784 Other hyperlipidemia: Secondary | ICD-10-CM | POA: Diagnosis not present

## 2015-12-16 DIAGNOSIS — I714 Abdominal aortic aneurysm, without rupture: Secondary | ICD-10-CM | POA: Diagnosis not present

## 2015-12-16 DIAGNOSIS — I129 Hypertensive chronic kidney disease with stage 1 through stage 4 chronic kidney disease, or unspecified chronic kidney disease: Secondary | ICD-10-CM | POA: Diagnosis not present

## 2015-12-16 DIAGNOSIS — I739 Peripheral vascular disease, unspecified: Secondary | ICD-10-CM | POA: Diagnosis not present

## 2015-12-16 DIAGNOSIS — D692 Other nonthrombocytopenic purpura: Secondary | ICD-10-CM | POA: Diagnosis not present

## 2015-12-16 DIAGNOSIS — Z0000 Encounter for general adult medical examination without abnormal findings: Secondary | ICD-10-CM | POA: Diagnosis not present

## 2015-12-30 DIAGNOSIS — L301 Dyshidrosis [pompholyx]: Secondary | ICD-10-CM | POA: Diagnosis not present

## 2016-01-01 DIAGNOSIS — L308 Other specified dermatitis: Secondary | ICD-10-CM | POA: Diagnosis not present

## 2016-01-09 DIAGNOSIS — Z6824 Body mass index (BMI) 24.0-24.9, adult: Secondary | ICD-10-CM | POA: Diagnosis not present

## 2016-01-09 DIAGNOSIS — M1A09X0 Idiopathic chronic gout, multiple sites, without tophus (tophi): Secondary | ICD-10-CM | POA: Diagnosis not present

## 2016-01-09 DIAGNOSIS — I10 Essential (primary) hypertension: Secondary | ICD-10-CM | POA: Diagnosis not present

## 2016-01-09 NOTE — Addendum Note (Signed)
Addended by: Lianne Cure A on: 01/09/2016 01:04 PM   Modules accepted: Orders

## 2016-03-02 DIAGNOSIS — L089 Local infection of the skin and subcutaneous tissue, unspecified: Secondary | ICD-10-CM | POA: Diagnosis not present

## 2016-03-02 DIAGNOSIS — Z6823 Body mass index (BMI) 23.0-23.9, adult: Secondary | ICD-10-CM | POA: Diagnosis not present

## 2016-03-02 DIAGNOSIS — L209 Atopic dermatitis, unspecified: Secondary | ICD-10-CM | POA: Diagnosis not present

## 2016-03-16 DIAGNOSIS — M109 Gout, unspecified: Secondary | ICD-10-CM | POA: Diagnosis not present

## 2016-03-16 DIAGNOSIS — I10 Essential (primary) hypertension: Secondary | ICD-10-CM | POA: Diagnosis not present

## 2016-03-16 DIAGNOSIS — Z6823 Body mass index (BMI) 23.0-23.9, adult: Secondary | ICD-10-CM | POA: Diagnosis not present

## 2016-03-16 DIAGNOSIS — N183 Chronic kidney disease, stage 3 (moderate): Secondary | ICD-10-CM | POA: Diagnosis not present

## 2016-03-16 DIAGNOSIS — I714 Abdominal aortic aneurysm, without rupture: Secondary | ICD-10-CM | POA: Diagnosis not present

## 2016-03-16 DIAGNOSIS — E784 Other hyperlipidemia: Secondary | ICD-10-CM | POA: Diagnosis not present

## 2016-03-16 DIAGNOSIS — N401 Enlarged prostate with lower urinary tract symptoms: Secondary | ICD-10-CM | POA: Diagnosis not present

## 2016-03-16 DIAGNOSIS — M19049 Primary osteoarthritis, unspecified hand: Secondary | ICD-10-CM | POA: Diagnosis not present

## 2016-03-16 DIAGNOSIS — I739 Peripheral vascular disease, unspecified: Secondary | ICD-10-CM | POA: Diagnosis not present

## 2016-03-16 DIAGNOSIS — I129 Hypertensive chronic kidney disease with stage 1 through stage 4 chronic kidney disease, or unspecified chronic kidney disease: Secondary | ICD-10-CM | POA: Diagnosis not present

## 2016-03-16 DIAGNOSIS — L209 Atopic dermatitis, unspecified: Secondary | ICD-10-CM | POA: Diagnosis not present

## 2016-03-16 DIAGNOSIS — D692 Other nonthrombocytopenic purpura: Secondary | ICD-10-CM | POA: Diagnosis not present

## 2016-03-31 DIAGNOSIS — L309 Dermatitis, unspecified: Secondary | ICD-10-CM | POA: Diagnosis not present

## 2016-05-01 ENCOUNTER — Encounter: Payer: Self-pay | Admitting: Family

## 2016-05-11 ENCOUNTER — Ambulatory Visit (HOSPITAL_COMMUNITY)
Admission: RE | Admit: 2016-05-11 | Discharge: 2016-05-11 | Disposition: A | Discharge status: Home / Self Care | Payer: Medicare Other | Source: Ambulatory Visit | Attending: Family | Admitting: Family

## 2016-05-11 ENCOUNTER — Ambulatory Visit (INDEPENDENT_AMBULATORY_CARE_PROVIDER_SITE_OTHER): Payer: Medicare Other | Admitting: Family

## 2016-05-11 ENCOUNTER — Encounter: Payer: Self-pay | Admitting: Family

## 2016-05-11 VITALS — BP 140/88 | HR 72 | Temp 98.0°F | Resp 16 | Ht 70.0 in | Wt 150.0 lb

## 2016-05-11 DIAGNOSIS — I714 Abdominal aortic aneurysm, without rupture, unspecified: Secondary | ICD-10-CM

## 2016-05-11 DIAGNOSIS — Z87891 Personal history of nicotine dependence: Secondary | ICD-10-CM | POA: Insufficient documentation

## 2016-05-11 DIAGNOSIS — I10 Essential (primary) hypertension: Secondary | ICD-10-CM | POA: Diagnosis not present

## 2016-05-11 NOTE — Patient Instructions (Signed)
Abdominal Aortic Aneurysm Blood pumps away from the heart through tubes (blood vessels) called arteries. Aneurysms are weak or damaged places in the Christopher of an artery. It bulges out like a balloon. An abdominal aortic aneurysm happens in the main artery of the body (aorta). It can burst or tear, causing bleeding inside the body. This is an emergency. It needs treatment right away. What are the causes? The exact cause is unknown. Things that could cause this problem include:  Fat and other substances building up in the lining of a tube.  Swelling of the walls of a blood vessel.  Certain tissue diseases.  Belly (abdominal) trauma.  An infection in the main artery of the body.  What increases the risk? There are things that make it more likely for you to have an aneurysm. These include:  Being over the age of 81 years old.  Having high blood pressure (hypertension).  Being a male.  Being white.  Being very overweight (obese).  Having a family history of aneurysm.  Using tobacco products.  What are the signs or symptoms? Symptoms depend on the size of the aneurysm and how fast it grows. There may not be symptoms. If symptoms occur, they can include:  Pain (belly, side, lower back, or groin).  Feeling full after eating a small amount of food.  Feeling sick to your stomach (nauseous), throwing up (vomiting), or both.  Feeling a lump in your belly that feels like it is beating (pulsating).  Feeling like you will pass out (faint).  How is this treated?  Medicine to control blood pressure and pain.  Imaging tests to see if the aneurysm gets bigger.  Surgery. How is this prevented? To lessen your chance of getting this condition:  Stop smoking. Stop chewing tobacco.  Limit or avoid alcohol.  Keep your blood pressure, blood sugar, and cholesterol within normal limits.  Eat less salt.  Eat foods low in saturated fats and cholesterol. These are found in animal and  whole dairy products.  Eat more fiber. Fiber is found in whole grains, vegetables, and fruits.  Keep a healthy weight.  Stay active and exercise often.  This information is not intended to replace advice given to you by your health care provider. Make sure you discuss any questions you have with your health care provider. Document Released: 05/23/2012 Document Revised: 07/04/2015 Document Reviewed: 02/26/2012 Elsevier Interactive Patient Education  2017 Elsevier Inc.  

## 2016-05-11 NOTE — Progress Notes (Signed)
[facility] [place]   CC: Follow up Abdominal Aortic Aneurysm  History of Present Illness  Justin [name] is a 83 y.o. (January 31, 1924) male patient of Dr. Trula Slade who presents with chief complaint: follow up for AAA. Previous studies demonstrate an AAA, measuring 4.65 cm on 03/05/14. His AAA was an incidental finding from imaging evaluating his spine issues. The patient denies abdominal pain.  He had several lumbar spine surgeries and rarely has back pain, denies any new back pain. The patient denies symptoms referable to claudication in legs with walking. He does have left ankle tight feeling most of the time and left foot numbness. The patient denies history of stroke or TIA symptoms.  He did not take his blood pressure medication this AM.   He takes a daily 81 mg ASA, daily beta blocker, does not take a statin, states he does not have cholesterol problem.   Pt Diabetic: No Pt smoker: former smoker, quit in 1980      Past Medical History:  Diagnosis Date  . AAA (abdominal aortic aneurysm) ([place])    "we've known about it since ~ 2000"  . Arthritis    in back and hands:  Gout  . Atrial fibrillation ([place])    a. 04/2011 post op  . Bronchitis    hx of; "once or twice in my life" (04/21/11)  . Chronic kidney disease   . Chronic lower back pain 1995  . GERD (gastroesophageal reflux disease)   . Gout of big toe    "h/o in both"  . Hiatal hernia   . Hypertension    Takes lisinopril/hctz  . Lumbar spondylosis   . Lumbar stenosis    a. s/p  anterolateral decompression and lateral plating 04/21/2011  . Migraine    "left ~ 1980's"  . Stomach ulcer    "years ago; treated w/diet"    Past Surgical History:  Procedure Laterality Date  . ANTERIOR LAT LUMBAR FUSION  04/21/11   w/PEEK cage, allograft, and lateral plate (r)  . ANTERIOR LAT LUMBAR FUSION  04/21/2011   Procedure: ANTERIOR LATERAL LUMBAR FUSION 1 LEVEL;  Surgeon: Erline Levine, MD;  Location: [facility]  NEURO ORS;  Service: Neurosurgery;  Laterality: Right;  RIGHT Lumbar three-four anterolateral decompression with fusion and lateral plating  . APPENDECTOMY  1946  . BACK SURGERY  04/21/11   "today makes the 5th time"  . CATARACT EXTRACTION, BILATERAL    . EYE SURGERY Bilateral   . PROSTATE SURGERY    . SPINE SURGERY     X's 5   . TONSILLECTOMY     "as a child"   Social History Social History   Social History  . Marital status: Married    Spouse name: N/A  . Number of children: N/A  . Years of education: N/A   Occupational History  . Not on file.   Social History Main Topics  . Smoking status: Former Smoker    Packs/day: 1.00    Years: 20.00    Types: Cigarettes    Quit date: 02/10/1980  . Smokeless tobacco: Never Used  . Alcohol use 0.0 oz/week     Comment: 04/21/11 'bottle of wine/year"  . Drug use: No  . Sexual activity: Yes   Other Topics Concern  . Not on file   Social History Narrative   Lives in [place].  Retired.  Activity limited by back pain.   Family History Family History  Problem Relation Age of  Onset  . Alzheimer's disease Mother     died @ 76  . COPD Father     died @ 3  . Cancer Father   . Hypertension Father   . Heart disease Father   . Stroke Sister   . Cancer Brother     stomach  . Heart disease Brother     Aneurysm  . Anesthesia problems Neg Hx   . Hypotension Neg Hx   . Malignant hyperthermia Neg Hx   . Pseudochol deficiency Neg Hx   . Hypertension Son   . Heart disease Son     Aneurysm  . Hypertension Son   . Heart disease Son     Aneurysm    Current Outpatient Prescriptions on File Prior to Visit  Medication Sig Dispense Refill  . allopurinol (ZYLOPRIM) 300 MG tablet daily. To prevent Gout  3  . aspirin EC 81 MG tablet Take 81 mg by mouth daily.     Marland Kitchen COLCRYS 0.6 MG tablet as needed. Gout    . dorzolamide-timolol (COSOPT) 22.3-6.8 MG/ML ophthalmic solution Place 1 drop into both eyes 2 (two) times daily.      Marland Kitchen dutasteride (AVODART) 0.5 MG capsule Take 0.5 mg by mouth 2 (two) times a week.    Marland Kitchen lisinopril-hydrochlorothiazide (PRINZIDE,ZESTORETIC) 20-12.5 MG per tablet Take 1 tablet by mouth daily.    Marland Kitchen LUMIGAN 0.01 % SOLN Place into both eyes daily.   3  . ULORIC 80 MG TABS daily. New medication for Gout  3  . metoprolol tartrate (LOPRESSOR) 25 MG tablet Take 1 tablet (25 mg total) by mouth 2 (two) times daily. 60 tablet 3   No current facility-administered medications on file prior to visit.    Allergies  Allergen Reactions  . Demerol Anaphylaxis  . Meperidine Anaphylaxis    ROS: See HPI for pertinent positives and negatives.  Physical Examination  Vitals:   05/11/16 0853  BP: 140/88  Pulse: 72  Resp: 16  Temp: 98 F (36.7 C)  TempSrc: Oral  SpO2: 99%  Weight: 150 lb (68 kg)  Height: 5\' 10"  (1.778 m)   Body mass index is 21.52 kg/m.  General: A&O x 3, WD male.  Pulmonary: Sym exp, respirations are non labored, good air movt, CTAB, no rales, rhonchi, or wheezing.   Cardiac: RRR, Nl S1, S2, no murmur detected.  Carotid Bruits Left Right   Negative Negative  Aorta is not palpable Radial pulses are are 2+ and =   VASCULAR EXAM:     LE Pulses LEFT RIGHT   FEMORAL palpable palpable   POPLITEAL not palpable  not palpable   POSTERIOR TIBIAL  palpable   palpable    DORSALIS PEDIS  ANTERIOR TIBIAL not palpable  not palpable     Gastrointestinal: soft, NTND, -G/R, - HSM, reducible moderate sized asymptomatic ventral hernia when lying down or sitting up, - CVAT B.  Musculoskeletal: M/S 5/5 throughout, Extremities without ischemic changes.   Neurologic: CN 2-12 intact except is hard of hearing, mostly corrected with  bilateral hearing aids, Pain and light touch intact in extremities, Motor exam as listed above.   Non-Invasive Vascular Imaging  AAA Duplex (05/11/2016)  Previous size: 4.99 cm (Date: 04-01-15), based on limited visualization due to overlying bowel gas, common iliac arteries not visualized.   Current size:  5.00 cm x 4.98 cm (Date: 05-11-16); right CIA: 1.26 cm; Left CIA: 1.16 cm  Medical Decision Making  The patient is a 83 y.o. male who  presents with asymptomatic AAA with no significant increase in size; is 5 cm today; pt is 84 years old, but a fit 83 year old.   Based on this patient's exam and diagnostic studies, the patient will follow up in in 2 weeks with the following studies: CTA abd/pelvis, see Dr. Trula Slade afterward.  Consideration for repair of AAA would be made when the size is 5.0 cm, growth > 1 cm/yr, and symptomatic status.  I emphasized the importance of maximal medical management including strict control of blood pressure, blood glucose, and lipid levels, antiplatelet agents, obtaining regular exercise, and continued cessation of smoking.   The patient was given information about AAA including signs, symptoms, treatment, and how to minimize the risk of enlargement and rupture of aneurysms.    The patient was advised to call 911 should the patient experience sudden onset abdominal or back pain.   Thank you for allowing [place] to participate in this patient's care.  Clemon Chambers, RN, MSN, FNP-C [facility] [place] Office: [phone]  Clinic Physician: Early on call  05/11/2016, 9:00 AM

## 2016-05-13 DIAGNOSIS — Z961 Presence of intraocular lens: Secondary | ICD-10-CM | POA: Diagnosis not present

## 2016-05-13 DIAGNOSIS — H401131 Primary open-angle glaucoma, bilateral, mild stage: Secondary | ICD-10-CM | POA: Diagnosis not present

## 2016-05-14 ENCOUNTER — Encounter: Payer: Self-pay | Admitting: Surgery

## 2016-05-19 ENCOUNTER — Ambulatory Visit
Admission: RE | Admit: 2016-05-19 | Discharge: 2016-05-19 | Disposition: A | Discharge status: Home / Self Care | Payer: Medicare Other | Source: Ambulatory Visit | Attending: Family | Admitting: Family

## 2016-05-19 DIAGNOSIS — I714 Abdominal aortic aneurysm, without rupture, unspecified: Secondary | ICD-10-CM

## 2016-05-19 MED ORDER — IOPAMIDOL (ISOVUE-370) INJECTION 76%
80.0000 mL | Freq: Once | INTRAVENOUS | Status: AC | PRN
  Administered 2016-05-19: 80 mL via INTRAVENOUS

## 2016-05-25 ENCOUNTER — Ambulatory Visit (INDEPENDENT_AMBULATORY_CARE_PROVIDER_SITE_OTHER): Payer: Medicare Other | Admitting: Surgery

## 2016-05-25 ENCOUNTER — Encounter: Payer: Self-pay | Admitting: Surgery

## 2016-05-25 VITALS — BP 156/89 | HR 67 | Temp 97.3°F | Resp 20 | Ht 70.0 in | Wt 154.8 lb

## 2016-05-25 DIAGNOSIS — I6529 Occlusion and stenosis of unspecified carotid artery: Secondary | ICD-10-CM

## 2016-05-25 DIAGNOSIS — I714 Abdominal aortic aneurysm, without rupture, unspecified: Secondary | ICD-10-CM

## 2016-05-25 DIAGNOSIS — I4891 Unspecified atrial fibrillation: Secondary | ICD-10-CM | POA: Diagnosis not present

## 2016-05-25 DIAGNOSIS — I739 Peripheral vascular disease, unspecified: Secondary | ICD-10-CM

## 2016-05-25 DIAGNOSIS — Z01812 Encounter for preprocedural laboratory examination: Secondary | ICD-10-CM

## 2016-05-25 NOTE — Progress Notes (Signed)
Vascular and Vein Specialist of [facility]  Patient name: Justin [name] MRN: [id number] DOB: 1923/10/24 Sex: male   REASON FOR VISIT:    AAA  HISOTRY OF PRESENT ILLNESS:    Justin [name] is a 83 y.o. male who returns today for follow up of his AAA. His most recent u/s revealed a increase in size.  He was sent for CTA.  The patient has a history multiple back surgeries.  He is without pain today.  He denies any abdominal pain.  He does not endorse symptoms of claudication.He denies any neurologic symptoms.     PAST MEDICAL HISTORY:   Past Medical History:  Diagnosis Date  . AAA (abdominal aortic aneurysm) ([place])    "we've known about it since ~ 2000"  . Arthritis    in back and hands:  Gout  . Atrial fibrillation ([place])    a. 04/2011 post op  . Bronchitis    hx of; "once or twice in my life" (04/21/11)  . Chronic kidney disease   . Chronic lower back pain 1995  . GERD (gastroesophageal reflux disease)   . Gout of big toe    "h/o in both"  . Hiatal hernia   . Hypertension    Takes lisinopril/hctz  . Lumbar spondylosis   . Lumbar stenosis    a. s/p  anterolateral decompression and lateral plating 04/21/2011  . Migraine    "left ~ 1980's"  . Stomach ulcer    "years ago; treated w/diet"      FAMILY HISTORY:   Family History  Problem Relation Age of Onset  . Alzheimer's disease Mother     died @ 59  . COPD Father     died @ 35  . Cancer Father   . Hypertension Father   . Heart disease Father   . Stroke Sister   . Cancer Brother     stomach  . Heart disease Brother     Aneurysm  . Anesthesia problems Neg Hx   . Hypotension Neg Hx   . Malignant hyperthermia Neg Hx   . Pseudochol deficiency Neg Hx   . Hypertension Son   . Heart disease Son     Aneurysm  . Hypertension Son   . Heart disease Son     Aneurysm    SOCIAL HISTORY:   Social History  Substance Use Topics  . Smoking status: Former Smoker    Packs/day:  1.00    Years: 20.00    Types: Cigarettes    Quit date: 02/10/1980  . Smokeless tobacco: Never Used  . Alcohol use 0.0 oz/week     Comment: 04/21/11 'bottle of wine/year"     ALLERGIES:   Allergies  Allergen Reactions  . Demerol Anaphylaxis  . Meperidine Anaphylaxis     CURRENT MEDICATIONS:   Current Outpatient Prescriptions  Medication Sig Dispense Refill  . allopurinol (ZYLOPRIM) 300 MG tablet daily. To prevent Gout  3  . aspirin EC 81 MG tablet Take 81 mg by mouth daily.     . brimonidine (ALPHAGAN) 0.2 % ophthalmic solution     . COLCRYS 0.6 MG tablet as needed. Gout    . dorzolamide-timolol (COSOPT) 22.3-6.8 MG/ML ophthalmic solution Place 1 drop into both eyes 2 (two) times daily.     Marland Kitchen dutasteride (AVODART) 0.5 MG capsule Take 0.5 mg by mouth 2 (two) times a week.    Marland Kitchen lisinopril-hydrochlorothiazide (PRINZIDE,ZESTORETIC) 20-12.5 MG per tablet Take 1 tablet by mouth daily.    Marland Kitchen  LUMIGAN 0.01 % SOLN Place into both eyes daily.   3  . triamcinolone cream (KENALOG) 0.1 %     . ULORIC 80 MG TABS daily. New medication for Gout  3  . metoprolol tartrate (LOPRESSOR) 25 MG tablet Take 1 tablet (25 mg total) by mouth 2 (two) times daily. 60 tablet 3   No current facility-administered medications for this visit.     REVIEW OF SYSTEMS:   [X]  denotes positive finding, [ ]  denotes negative finding Cardiac  Comments:  Chest pain or chest pressure:    Shortness of breath upon exertion:    Short of breath when lying flat:    Irregular heart rhythm:        Vascular    Pain in calf, thigh, or hip brought on by ambulation:    Pain in feet at night that wakes you up from your sleep:     Blood clot in your veins:    Leg swelling:         Pulmonary    Oxygen at home:    Productive cough:     Wheezing:         Neurologic    Sudden weakness in arms or legs:     Sudden numbness in arms or legs:     Sudden onset of difficulty speaking or slurred speech:    Temporary loss of  vision in one eye:     Problems with dizziness:         Gastrointestinal    Blood in stool:     Vomited blood:         Genitourinary    Burning when urinating:     Blood in urine:        Psychiatric    Major depression:         Hematologic    Bleeding problems:    Problems with blood clotting too easily:        Skin    Rashes or ulcers:        Constitutional    Fever or chills:      PHYSICAL EXAM:   Vitals:   05/25/16 1029  BP: (!) 156/89  Pulse: 67  Resp: 20  Temp: 97.3 F (36.3 C)  TempSrc: Oral  SpO2: 99%  Weight: 154 lb 12.8 oz (70.2 kg)  Height: 5\' 10"  (1.778 m)    GENERAL: The patient is a well-nourished male, in no acute distress. The vital signs are documented above. CARDIAC: There is a regular rate and rhythm.  VASCULAR: Palpable right posterior tibial.  I cannot palpate the left.  No carotid bruit PULMONARY: Non-labored respirations ABDOMEN: Soft and non-tender with normal pitched bowel sounds.  MUSCULOSKELETAL: There are no major deformities or cyanosis. NEUROLOGIC: No focal weakness or paresthesias are detected. SKIN: There are no ulcers or rashes noted. PSYCHIATRIC: The patient has a normal affect.  STUDIES:   Due to his CT angiogram.  This reveals a 5.5 cm infrarenal abdominal aortic aneurysm without evidence of rupture.  MEDICAL ISSUES:   AAA:  We had a lengthy discussion regarding our treatment options of continued observation versus endovascular repair.  Ultimately, we decided to proceed with endovascular repair.  I discussed the risks and benefits of the operation which are included but not limited to the risk of death, cardiopulmonary complications, renal insufficiency, intestinal ischemia, lower extremity ischemia.  We also discussed endoleak.  All his questions were answered.  He wishes to proceed.  His operations been scheduled  for Wednesday, April 25.  I am ordering a Myoview as well as carotid Dopplers and lower extremity  Dopplers.    [name], MD Vascular and Vein Specialists of [facility] [phone] Pager [phone]

## 2016-05-25 NOTE — Addendum Note (Signed)
Addended by: Lianne Cure A on: 05/25/2016 03:32 PM   Modules accepted: Orders

## 2016-05-26 ENCOUNTER — Ambulatory Visit (HOSPITAL_COMMUNITY)
Admission: RE | Admit: 2016-05-26 | Discharge: 2016-05-26 | Disposition: A | Discharge status: Home / Self Care | Payer: Medicare Other | Source: Ambulatory Visit | Attending: Vascular Surgery | Admitting: Vascular Surgery

## 2016-05-26 DIAGNOSIS — L821 Other seborrheic keratosis: Secondary | ICD-10-CM | POA: Diagnosis not present

## 2016-05-26 DIAGNOSIS — I6529 Occlusion and stenosis of unspecified carotid artery: Secondary | ICD-10-CM | POA: Diagnosis not present

## 2016-05-26 DIAGNOSIS — I6523 Occlusion and stenosis of bilateral carotid arteries: Secondary | ICD-10-CM | POA: Insufficient documentation

## 2016-05-26 DIAGNOSIS — L309 Dermatitis, unspecified: Secondary | ICD-10-CM | POA: Diagnosis not present

## 2016-05-27 ENCOUNTER — Other Ambulatory Visit: Payer: Self-pay | Admitting: *Deleted

## 2016-05-27 ENCOUNTER — Ambulatory Visit (HOSPITAL_COMMUNITY)
Admission: RE | Admit: 2016-05-27 | Discharge: 2016-05-27 | Disposition: A | Discharge status: Home / Self Care | Payer: Medicare Other | Source: Ambulatory Visit | Attending: Vascular Surgery | Admitting: Vascular Surgery

## 2016-05-27 ENCOUNTER — Ambulatory Visit (INDEPENDENT_AMBULATORY_CARE_PROVIDER_SITE_OTHER)
Admission: RE | Admit: 2016-05-27 | Discharge: 2016-05-27 | Disposition: A | Discharge status: Home / Self Care | Payer: Medicare Other | Source: Ambulatory Visit | Attending: Vascular Surgery | Admitting: Vascular Surgery

## 2016-05-27 ENCOUNTER — Telehealth (HOSPITAL_COMMUNITY): Payer: Self-pay | Admitting: *Deleted

## 2016-05-27 DIAGNOSIS — R071 Chest pain on breathing: Secondary | ICD-10-CM

## 2016-05-27 DIAGNOSIS — R0789 Other chest pain: Secondary | ICD-10-CM

## 2016-05-27 DIAGNOSIS — I739 Peripheral vascular disease, unspecified: Secondary | ICD-10-CM

## 2016-05-27 DIAGNOSIS — I480 Paroxysmal atrial fibrillation: Secondary | ICD-10-CM

## 2016-05-27 NOTE — Telephone Encounter (Signed)
Patient given detailed instructions per Myocardial Perfusion Study Information Sheet for the test on 05/28/16 at 7:45. Patient notified to arrive 15 minutes early and that it is imperative to arrive on time for appointment to keep from having the test rescheduled.  If you need to cancel or reschedule your appointment, please call the office within 24 hours of your appointment. Failure to do so may result in a cancellation of your appointment, and a $50 no show fee. Patient verbalized understanding.Veronia Beets

## 2016-05-28 ENCOUNTER — Ambulatory Visit (HOSPITAL_COMMUNITY): Payer: Medicare Other | Attending: Cardiology

## 2016-05-28 DIAGNOSIS — R0789 Other chest pain: Secondary | ICD-10-CM | POA: Insufficient documentation

## 2016-05-28 LAB — MYOCARDIAL PERFUSION IMAGING
CSEPPHR: 89 {beats}/min
LV dias vol: 78 mL (ref 62–150)
LVSYSVOL: 33 mL
RATE: 0.32
Rest HR: 57 {beats}/min
SDS: 2
SRS: 5
SSS: 7
TID: 1.15

## 2016-05-28 MED ORDER — REGADENOSON 0.4 MG/5ML IV SOLN
0.4000 mg | Freq: Once | INTRAVENOUS | Status: AC
  Administered 2016-05-28: 0.4 mg via INTRAVENOUS

## 2016-05-28 MED ORDER — TECHNETIUM TC 99M TETROFOSMIN IV KIT
31.6000 | PACK | Freq: Once | INTRAVENOUS | Status: AC | PRN
  Administered 2016-05-28: 31.6 via INTRAVENOUS
  Filled 2016-05-28: qty 32

## 2016-05-28 MED ORDER — TECHNETIUM TC 99M TETROFOSMIN IV KIT
10.2000 | PACK | Freq: Once | INTRAVENOUS | Status: AC | PRN
  Administered 2016-05-28: 10.2 via INTRAVENOUS
  Filled 2016-05-28: qty 11

## 2016-05-29 ENCOUNTER — Other Ambulatory Visit: Payer: Self-pay

## 2016-06-01 ENCOUNTER — Encounter (HOSPITAL_COMMUNITY): Payer: Self-pay

## 2016-06-01 ENCOUNTER — Encounter (HOSPITAL_COMMUNITY)
Admission: RE | Admit: 2016-06-01 | Discharge: 2016-06-01 | Disposition: A | Discharge status: Home / Self Care | Payer: Medicare Other | Source: Ambulatory Visit | Attending: Surgery | Admitting: Surgery

## 2016-06-01 HISTORY — DX: Malignant (primary) neoplasm, unspecified: C80.1

## 2016-06-01 HISTORY — DX: Benign prostatic hyperplasia without lower urinary tract symptoms: N40.0

## 2016-06-01 HISTORY — DX: Personal history of urinary calculi: Z87.442

## 2016-06-01 HISTORY — DX: Dermatitis, unspecified: L30.9

## 2016-06-01 LAB — URINALYSIS, ROUTINE W REFLEX MICROSCOPIC
Bilirubin Urine: NEGATIVE
Glucose, UA: NEGATIVE mg/dL
HGB URINE DIPSTICK: NEGATIVE
KETONES UR: NEGATIVE mg/dL
Leukocytes, UA: NEGATIVE
Nitrite: NEGATIVE
PROTEIN: NEGATIVE mg/dL
Specific Gravity, Urine: 1.016 (ref 1.005–1.030)
pH: 7 (ref 5.0–8.0)

## 2016-06-01 LAB — CBC
HCT: 42.2 % (ref 39.0–52.0)
HEMOGLOBIN: 14 g/dL (ref 13.0–17.0)
MCH: 31.7 pg (ref 26.0–34.0)
MCHC: 33.2 g/dL (ref 30.0–36.0)
MCV: 95.7 fL (ref 78.0–100.0)
Platelets: 201 10*3/uL (ref 150–400)
RBC: 4.41 MIL/uL (ref 4.22–5.81)
RDW: 14.1 % (ref 11.5–15.5)
WBC: 6.3 10*3/uL (ref 4.0–10.5)

## 2016-06-01 LAB — COMPREHENSIVE METABOLIC PANEL
ALBUMIN: 4 g/dL (ref 3.5–5.0)
ALK PHOS: 66 U/L (ref 38–126)
ALT: 18 U/L (ref 17–63)
ANION GAP: 9 (ref 5–15)
AST: 19 U/L (ref 15–41)
BUN: 29 mg/dL — ABNORMAL HIGH (ref 6–20)
CO2: 23 mmol/L (ref 22–32)
CREATININE: 1.2 mg/dL (ref 0.61–1.24)
Calcium: 9.2 mg/dL (ref 8.9–10.3)
Chloride: 110 mmol/L (ref 101–111)
GFR calc Af Amer: 59 mL/min — ABNORMAL LOW (ref >60)
GFR calc non Af Amer: 51 mL/min — ABNORMAL LOW (ref >60)
GLUCOSE: 98 mg/dL (ref 65–99)
Potassium: 4 mmol/L (ref 3.5–5.1)
SODIUM: 142 mmol/L (ref 135–145)
Total Bilirubin: 0.6 mg/dL (ref 0.3–1.2)
Total Protein: 6.3 g/dL — ABNORMAL LOW (ref 6.5–8.1)

## 2016-06-01 LAB — PROTIME-INR
INR: 1.07
Prothrombin Time: 13.9 seconds (ref 11.4–15.2)

## 2016-06-01 LAB — BLOOD GAS, ARTERIAL
Acid-base deficit: 0.1 mmol/L (ref 0.0–2.0)
Bicarbonate: 23.7 mmol/L (ref 20.0–28.0)
Drawn by: 421801
FIO2: 21
O2 Saturation: 98.1 %
PATIENT TEMPERATURE: 98.6
pCO2 arterial: 36.9 mmHg (ref 32.0–48.0)
pH, Arterial: 7.424 (ref 7.350–7.450)
pO2, Arterial: 111 mmHg — ABNORMAL HIGH (ref 83.0–108.0)

## 2016-06-01 LAB — TYPE AND SCREEN
ABO/RH(D): O POS
Antibody Screen: NEGATIVE

## 2016-06-01 LAB — APTT: APTT: 32 s (ref 24–36)

## 2016-06-01 LAB — SURGICAL PCR SCREEN
MRSA, PCR: NEGATIVE
Staphylococcus aureus: NEGATIVE

## 2016-06-01 NOTE — Progress Notes (Signed)
Justin [name] denies chest pain or shortness of breath. 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 Patient reports that he does not see a  Cardiologist- "I don't need one."

## 2016-06-01 NOTE — Pre-Procedure Instructions (Signed)
    Justin [name]  06/01/2016    Your procedure is scheduled on Wednesday, April 25  Report to [place] at 5:30 AM                    Your surgery or procedure is scheduled for 7:30 AM   Call this number if you have problems the morning of surgery: [phone]     Remember:  Do not eat food or drink liquids after midnight Tuesday, April 24  Take these medicines the morning of surgery with A SIP OF WATER : Aspirin, metoprolol tartrate (LOPRESSOR).   Do not wear jewelry, make-up or nail polish.  Do not wear lotions, powders, or perfumes, or deodorant.  Do not shave 48 hours prior to surgery.  Men may shave face and neck.  Do not bring valuables to the hospital.  [facility] is not responsible for any belongings or valuables.  Contacts, dentures or bridgework may not be worn into surgery.  Leave your suitcase in the car.  After surgery it may be brought to your room.  For patients admitted to the hospital, discharge time will be determined by your treatment team.  Patients discharged the day of surgery will not be allowed to drive home.   Special instructions:Review  [facility] - Preparing For Surgery.  Please read over the following fact sheets that you were given: [facility]- Preparing For Surgery and Patient Instructions for Mupirocin Application, Coughing and Deep,  Pain Booklet, Surgical Site Infection

## 2016-06-02 NOTE — Anesthesia Preprocedure Evaluation (Addendum)
Anesthesia Evaluation  Patient identified by MRN, date of birth, ID band Patient awake    Reviewed: Allergy & Precautions, NPO status , Patient's Chart, lab work & pertinent test results  Airway Mallampati: II  TM Distance: >3 FB Neck ROM: Full    Dental  (+) Dental Advisory Given   Pulmonary former smoker,    breath sounds clear to auscultation       Cardiovascular hypertension, Pt. on medications + Peripheral Vascular Disease   Rhythm:Regular Rate:Normal  Nuclear stress test 05/28/16:  Nuclear stress EF: 57%.  There was no ST segment deviation noted during stress.  There is a small defect of mild severity present in the basal inferolateral, mid inferolateral, apical lateral and apex location. The defect is non-reversible and likely secondary to diaphragmatic attenuation artifact. No ischemia noted.  This is a low risk study.  The left ventricular ejection fraction is normal (55-65%).   Neuro/Psych  Headaches,    GI/Hepatic Neg liver ROS, hiatal hernia, PUD, GERD  ,  Endo/Other  negative endocrine ROS  Renal/GU Renal disease     Musculoskeletal  (+) Arthritis ,   Abdominal   Peds  Hematology negative hematology ROS (+)   Anesthesia Other Findings   Reproductive/Obstetrics                            Lab Results  Component Value Date   WBC 6.3 06/01/2016   HGB 14.0 06/01/2016   HCT 42.2 06/01/2016   MCV 95.7 06/01/2016   PLT 201 06/01/2016   Lab Results  Component Value Date   CREATININE 1.20 06/01/2016   BUN 29 (H) 06/01/2016   NA 142 06/01/2016   K 4.0 06/01/2016   CL 110 06/01/2016   CO2 23 06/01/2016    Anesthesia Physical Anesthesia Plan  ASA: III  Anesthesia Plan: General   Post-op Pain Management:    Induction: Intravenous  Airway Management Planned: Oral ETT  Additional Equipment: Arterial line  Intra-op Plan:   Post-operative Plan: Extubation in  OR  Informed Consent: I have reviewed the patients History and Physical, chart, labs and discussed the procedure including the risks, benefits and alternatives for the proposed anesthesia with the patient or authorized representative who has indicated his/her understanding and acceptance.   Dental advisory given  Plan Discussed with: CRNA  Anesthesia Plan Comments:        Anesthesia Quick Evaluation

## 2016-06-02 NOTE — Progress Notes (Signed)
Anesthesia Chart Review: Patient is a 83 year old male scheduled for EVAR AAA on 06/03/16 by Dr. Trula Slade.   History includes HTN, GERD, hiatal hernia, arthritis, bronchitis, former smoker (quit '82), AAA, nephrolithiasis, skin cancer ([place]), CKD stage III, appendectomy, tonsillectomy, BPH, prostate surgery, L3-4 ALIF 04/21/11 with post-operative afib.  PCP is Dr. Burnard Bunting at [facility]). He is not routinely followed by a cardiologist, but saw Dr. Glori Bickers in 2013 for brief post-of afib with RVR. PRN follow-up was recommended following 05/25/11 out-patient visit. Dr. Trula Slade ordered a pre-operative stress test which was considered low risk.  BP (!) 153/78   Pulse 80   Temp 36.6 C   Resp 20   Ht 5\' 9"  (1.753 m)   Wt 154 lb 6.4 oz (70 kg)   SpO2 100%   BMI 22.80 kg/m   EKG 12/16/15 ([facility]): SR, long QT interval (QT 452, QTc 481 ms).  Nuclear stress test 05/28/16:  Nuclear stress EF: 57%.  There was no ST segment deviation noted during stress.  There is a small defect of mild severity present in the basal inferolateral, mid inferolateral, apical lateral and apex location. The defect is non-reversible and likely secondary to diaphragmatic attenuation artifact. No ischemia noted.  This is a low risk study.  The left ventricular ejection fraction is normal (55-65%).  Echo 04/24/11: Study Conclusions Left ventricle: LV is hyperdynamic with near cavity obliteration. The cavity size was below normal. Alkema thickness was normal. Systolic function was vigorous. The estimated ejection fraction was in the range of 65% to 70%.  Carotid U/S 05/26/16: Impression:  Bilateral ICA velocities suggest 4-59% stenosis.   CTA abd/pelvis 05/19/16: IMPRESSION: VASCULAR 5.5 cm infrarenal abdominal aortic aneurysm. NON-VASCULAR No acute abnormality in the abdomen or pelvis. Bilateral renal cysts. Small pulmonary nodules, largest measuring 0.5 cm. No follow-up needed if  patient is low-risk (and has no known or suspected primary neoplasm). Non-contrast chest CT can be considered in 12 months if patient is high-risk. This recommendation follows the consensus statement: Guidelines for Management of Incidental Pulmonary Nodules Detected on CT Images: From the Fleischner Society 2017; Radiology 2017; 284:228-243.  Preoperative labs noted.  BUN 29, Cr 1.2.  CBC, PT/PTT WNL. Glucose 98. (A1c at [facility] on 12/10/15 was 5.6.)  T&S done. UA WNL.  If no acute changes then I anticipate that he can proceed as planned.  George [facility] Short Stay Center/Anesthesiology Phone [phone] 06/02/2016 1:30 PM

## 2016-06-03 ENCOUNTER — Inpatient Hospital Stay (HOSPITAL_COMMUNITY): Payer: Medicare Other | Admitting: Vascular Surgery

## 2016-06-03 ENCOUNTER — Inpatient Hospital Stay (HOSPITAL_COMMUNITY)
Admission: RE | Admit: 2016-06-03 | Discharge: 2016-06-04 | DRG: 269 | Disposition: A | Discharge status: Home / Self Care | Payer: Medicare Other | Source: Ambulatory Visit | Attending: Surgery | Admitting: Surgery

## 2016-06-03 ENCOUNTER — Inpatient Hospital Stay (HOSPITAL_COMMUNITY): Payer: Medicare Other

## 2016-06-03 ENCOUNTER — Encounter (HOSPITAL_COMMUNITY)
Admission: RE | Disposition: A | Discharge status: Home / Self Care | Payer: Self-pay | Source: Ambulatory Visit | Attending: Surgery

## 2016-06-03 ENCOUNTER — Inpatient Hospital Stay (HOSPITAL_COMMUNITY): Payer: Medicare Other | Admitting: Anesthesiology

## 2016-06-03 ENCOUNTER — Encounter (HOSPITAL_COMMUNITY): Payer: Self-pay | Admitting: *Deleted

## 2016-06-03 DIAGNOSIS — Z9889 Other specified postprocedural states: Secondary | ICD-10-CM

## 2016-06-03 DIAGNOSIS — Z885 Allergy status to narcotic agent status: Secondary | ICD-10-CM | POA: Diagnosis not present

## 2016-06-03 DIAGNOSIS — Z87891 Personal history of nicotine dependence: Secondary | ICD-10-CM | POA: Diagnosis not present

## 2016-06-03 DIAGNOSIS — J9811 Atelectasis: Secondary | ICD-10-CM | POA: Diagnosis not present

## 2016-06-03 DIAGNOSIS — M545 Low back pain: Secondary | ICD-10-CM | POA: Diagnosis not present

## 2016-06-03 DIAGNOSIS — N183 Chronic kidney disease, stage 3 (moderate): Secondary | ICD-10-CM | POA: Diagnosis not present

## 2016-06-03 DIAGNOSIS — Z8249 Family history of ischemic heart disease and other diseases of the circulatory system: Secondary | ICD-10-CM | POA: Diagnosis not present

## 2016-06-03 DIAGNOSIS — Z7982 Long term (current) use of aspirin: Secondary | ICD-10-CM

## 2016-06-03 DIAGNOSIS — I10 Essential (primary) hypertension: Secondary | ICD-10-CM | POA: Diagnosis not present

## 2016-06-03 DIAGNOSIS — N400 Enlarged prostate without lower urinary tract symptoms: Secondary | ICD-10-CM | POA: Diagnosis present

## 2016-06-03 DIAGNOSIS — Z87442 Personal history of urinary calculi: Secondary | ICD-10-CM

## 2016-06-03 DIAGNOSIS — Z85828 Personal history of other malignant neoplasm of skin: Secondary | ICD-10-CM

## 2016-06-03 DIAGNOSIS — I4891 Unspecified atrial fibrillation: Secondary | ICD-10-CM | POA: Diagnosis present

## 2016-06-03 DIAGNOSIS — Z79899 Other long term (current) drug therapy: Secondary | ICD-10-CM | POA: Diagnosis not present

## 2016-06-03 DIAGNOSIS — G8929 Other chronic pain: Secondary | ICD-10-CM | POA: Diagnosis present

## 2016-06-03 DIAGNOSIS — I129 Hypertensive chronic kidney disease with stage 1 through stage 4 chronic kidney disease, or unspecified chronic kidney disease: Secondary | ICD-10-CM | POA: Diagnosis not present

## 2016-06-03 DIAGNOSIS — I714 Abdominal aortic aneurysm, without rupture, unspecified: Secondary | ICD-10-CM

## 2016-06-03 DIAGNOSIS — K219 Gastro-esophageal reflux disease without esophagitis: Secondary | ICD-10-CM | POA: Diagnosis not present

## 2016-06-03 DIAGNOSIS — Z8711 Personal history of peptic ulcer disease: Secondary | ICD-10-CM

## 2016-06-03 DIAGNOSIS — Z809 Family history of malignant neoplasm, unspecified: Secondary | ICD-10-CM

## 2016-06-03 DIAGNOSIS — Z8679 Personal history of other diseases of the circulatory system: Secondary | ICD-10-CM | POA: Diagnosis present

## 2016-06-03 HISTORY — DX: Abdominal aortic aneurysm, without rupture, unspecified: I71.40

## 2016-06-03 HISTORY — PX: ABDOMINAL AORTIC ENDOVASCULAR STENT GRAFT: SHX5707

## 2016-06-03 HISTORY — DX: Abdominal aortic aneurysm, without rupture: I71.4

## 2016-06-03 HISTORY — PX: ABDOMINAL AORTA STENT: SHX1108

## 2016-06-03 LAB — BASIC METABOLIC PANEL
Anion gap: 8 (ref 5–15)
BUN: 24 mg/dL — ABNORMAL HIGH (ref 6–20)
CHLORIDE: 109 mmol/L (ref 101–111)
CO2: 23 mmol/L (ref 22–32)
CREATININE: 1.06 mg/dL (ref 0.61–1.24)
Calcium: 8.7 mg/dL — ABNORMAL LOW (ref 8.9–10.3)
GFR calc non Af Amer: 59 mL/min — ABNORMAL LOW (ref >60)
Glucose, Bld: 111 mg/dL — ABNORMAL HIGH (ref 65–99)
Potassium: 3.9 mmol/L (ref 3.5–5.1)
Sodium: 140 mmol/L (ref 135–145)

## 2016-06-03 LAB — CBC
HEMATOCRIT: 40.3 % (ref 39.0–52.0)
HEMOGLOBIN: 13.4 g/dL (ref 13.0–17.0)
MCH: 31.5 pg (ref 26.0–34.0)
MCHC: 33.3 g/dL (ref 30.0–36.0)
MCV: 94.8 fL (ref 78.0–100.0)
Platelets: 141 10*3/uL — ABNORMAL LOW (ref 150–400)
RBC: 4.25 MIL/uL (ref 4.22–5.81)
RDW: 14.1 % (ref 11.5–15.5)
WBC: 6.1 10*3/uL (ref 4.0–10.5)

## 2016-06-03 LAB — PROTIME-INR
INR: 1.21
Prothrombin Time: 15.3 seconds — ABNORMAL HIGH (ref 11.4–15.2)

## 2016-06-03 LAB — MAGNESIUM: MAGNESIUM: 1.7 mg/dL (ref 1.7–2.4)

## 2016-06-03 LAB — APTT: APTT: 37 s — AB (ref 24–36)

## 2016-06-03 SURGERY — INSERTION, ENDOVASCULAR STENT GRAFT, AORTA, ABDOMINAL
Anesthesia: General | Site: Abdomen

## 2016-06-03 MED ORDER — FENTANYL CITRATE (PF) 250 MCG/5ML IJ SOLN
INTRAMUSCULAR | Status: DC | PRN
  Administered 2016-06-03: 25 ug via INTRAVENOUS
  Administered 2016-06-03: 100 ug via INTRAVENOUS

## 2016-06-03 MED ORDER — IODIXANOL 320 MG/ML IV SOLN
INTRAVENOUS | Status: DC | PRN
  Administered 2016-06-03: 49.6 mL via INTRAVENOUS

## 2016-06-03 MED ORDER — METOPROLOL TARTRATE 5 MG/5ML IV SOLN: 2.0000 mg | INTRAVENOUS | Status: DC | PRN

## 2016-06-03 MED ORDER — LIDOCAINE 2% (20 MG/ML) 5 ML SYRINGE
INTRAMUSCULAR | Status: DC | PRN
  Administered 2016-06-03: 40 mg via INTRAVENOUS

## 2016-06-03 MED ORDER — PROTAMINE SULFATE 10 MG/ML IV SOLN
INTRAVENOUS | Status: DC | PRN
  Administered 2016-06-03: 50 mg via INTRAVENOUS

## 2016-06-03 MED ORDER — BRIMONIDINE TARTRATE 0.2 % OP SOLN
1.0000 [drp] | Freq: Two times a day (BID) | OPHTHALMIC | Status: DC
  Administered 2016-06-03 – 2016-06-04 (×2): 1 [drp] via OPHTHALMIC
  Filled 2016-06-03: qty 5

## 2016-06-03 MED ORDER — ROCURONIUM BROMIDE 10 MG/ML (PF) SYRINGE
PREFILLED_SYRINGE | INTRAVENOUS | Status: DC | PRN
  Administered 2016-06-03 (×2): 10 mg via INTRAVENOUS
  Administered 2016-06-03: 40 mg via INTRAVENOUS

## 2016-06-03 MED ORDER — ONDANSETRON HCL 4 MG/2ML IJ SOLN
4.0000 mg | Freq: Four times a day (QID) | INTRAMUSCULAR | Status: DC | PRN
  Administered 2016-06-04: 4 mg via INTRAVENOUS
  Filled 2016-06-03: qty 2

## 2016-06-03 MED ORDER — ASPIRIN EC 81 MG PO TBEC
81.0000 mg | DELAYED_RELEASE_TABLET | Freq: Every day | ORAL | Status: DC
  Administered 2016-06-03 – 2016-06-04 (×2): 81 mg via ORAL
  Filled 2016-06-03 (×2): qty 1

## 2016-06-03 MED ORDER — PROPOFOL 10 MG/ML IV BOLUS
INTRAVENOUS | Status: AC
  Filled 2016-06-03: qty 20

## 2016-06-03 MED ORDER — OXYCODONE-ACETAMINOPHEN 5-325 MG PO TABS: 1.0000 | ORAL_TABLET | ORAL | Status: DC | PRN

## 2016-06-03 MED ORDER — POTASSIUM CHLORIDE CRYS ER 20 MEQ PO TBCR: 20.0000 meq | EXTENDED_RELEASE_TABLET | Freq: Every day | ORAL | Status: DC | PRN

## 2016-06-03 MED ORDER — FENTANYL CITRATE (PF) 250 MCG/5ML IJ SOLN
INTRAMUSCULAR | Status: AC
  Filled 2016-06-03: qty 5

## 2016-06-03 MED ORDER — DEXTROSE 5 % IV SOLN
1.5000 g | INTRAVENOUS | Status: AC
  Administered 2016-06-03: 1.5 g via INTRAVENOUS

## 2016-06-03 MED ORDER — HYDRALAZINE HCL 20 MG/ML IJ SOLN: 5.0000 mg | INTRAMUSCULAR | Status: DC | PRN

## 2016-06-03 MED ORDER — 0.9 % SODIUM CHLORIDE (POUR BTL) OPTIME
TOPICAL | Status: DC | PRN
  Administered 2016-06-03: 1000 mL

## 2016-06-03 MED ORDER — SODIUM CHLORIDE 0.9 % IV SOLN: 500.0000 mL | Freq: Once | INTRAVENOUS | Status: DC | PRN

## 2016-06-03 MED ORDER — PHENYLEPHRINE 40 MCG/ML (10ML) SYRINGE FOR IV PUSH (FOR BLOOD PRESSURE SUPPORT)
PREFILLED_SYRINGE | INTRAVENOUS | Status: DC | PRN
  Administered 2016-06-03: 80 ug via INTRAVENOUS

## 2016-06-03 MED ORDER — SENNOSIDES-DOCUSATE SODIUM 8.6-50 MG PO TABS: 1.0000 | ORAL_TABLET | Freq: Every evening | ORAL | Status: DC | PRN

## 2016-06-03 MED ORDER — DOCUSATE SODIUM 100 MG PO CAPS
100.0000 mg | ORAL_CAPSULE | Freq: Every day | ORAL | Status: DC
  Administered 2016-06-04: 100 mg via ORAL
  Filled 2016-06-03 (×2): qty 1

## 2016-06-03 MED ORDER — PHENYLEPHRINE HCL 10 MG/ML IJ SOLN
INTRAVENOUS | Status: DC | PRN
  Administered 2016-06-03: 25 ug/min via INTRAVENOUS

## 2016-06-03 MED ORDER — LISINOPRIL-HYDROCHLOROTHIAZIDE 20-12.5 MG PO TABS: 1.0000 | ORAL_TABLET | Freq: Every day | ORAL | Status: DC

## 2016-06-03 MED ORDER — LACTATED RINGERS IV SOLN
INTRAVENOUS | Status: DC | PRN
  Administered 2016-06-03: 07:00:00 via INTRAVENOUS

## 2016-06-03 MED ORDER — CHLORHEXIDINE GLUCONATE 4 % EX LIQD: 60.0000 mL | Freq: Once | CUTANEOUS | Status: DC

## 2016-06-03 MED ORDER — SODIUM CHLORIDE 0.9 % IV SOLN: INTRAVENOUS | Status: DC

## 2016-06-03 MED ORDER — PROPOFOL 10 MG/ML IV BOLUS
INTRAVENOUS | Status: DC | PRN
  Administered 2016-06-03: 100 mg via INTRAVENOUS

## 2016-06-03 MED ORDER — DUTASTERIDE 0.5 MG PO CAPS: 0.5000 mg | ORAL_CAPSULE | ORAL | Status: DC

## 2016-06-03 MED ORDER — MAGNESIUM SULFATE 2 GM/50ML IV SOLN: 2.0000 g | Freq: Every day | INTRAVENOUS | Status: DC | PRN

## 2016-06-03 MED ORDER — SODIUM CHLORIDE 0.9 % IV SOLN
INTRAVENOUS | Status: DC | PRN
  Administered 2016-06-03: 09:00:00 500 mL

## 2016-06-03 MED ORDER — HEPARIN SODIUM (PORCINE) 1000 UNIT/ML IJ SOLN
INTRAMUSCULAR | Status: DC | PRN
  Administered 2016-06-03: 7000 [IU] via INTRAVENOUS

## 2016-06-03 MED ORDER — MORPHINE SULFATE (PF) 2 MG/ML IV SOLN: 2.0000 mg | INTRAVENOUS | Status: DC | PRN

## 2016-06-03 MED ORDER — BISACODYL 5 MG PO TBEC: 5.0000 mg | DELAYED_RELEASE_TABLET | Freq: Every day | ORAL | Status: DC | PRN

## 2016-06-03 MED ORDER — PANTOPRAZOLE SODIUM 40 MG PO TBEC
40.0000 mg | DELAYED_RELEASE_TABLET | Freq: Every day | ORAL | Status: DC
  Administered 2016-06-03 – 2016-06-04 (×2): 40 mg via ORAL
  Filled 2016-06-03 (×2): qty 1

## 2016-06-03 MED ORDER — SODIUM CHLORIDE 0.9 % IV SOLN
INTRAVENOUS | Status: DC
  Administered 2016-06-03: 125 mL via INTRAVENOUS

## 2016-06-03 MED ORDER — DORZOLAMIDE HCL-TIMOLOL MAL 2-0.5 % OP SOLN
1.0000 [drp] | Freq: Two times a day (BID) | OPHTHALMIC | Status: DC
  Administered 2016-06-03 – 2016-06-04 (×2): 1 [drp] via OPHTHALMIC
  Filled 2016-06-03: qty 10

## 2016-06-03 MED ORDER — SUGAMMADEX SODIUM 200 MG/2ML IV SOLN
INTRAVENOUS | Status: DC | PRN
  Administered 2016-06-03: 200 mg via INTRAVENOUS

## 2016-06-03 MED ORDER — LISINOPRIL 20 MG PO TABS
20.0000 mg | ORAL_TABLET | Freq: Every day | ORAL | Status: DC
  Administered 2016-06-03 – 2016-06-04 (×2): 20 mg via ORAL
  Filled 2016-06-03 (×2): qty 1

## 2016-06-03 MED ORDER — LABETALOL HCL 5 MG/ML IV SOLN
INTRAVENOUS | Status: AC
  Filled 2016-06-03: qty 4

## 2016-06-03 MED ORDER — DEXTROSE 5 % IV SOLN
INTRAVENOUS | Status: AC
  Filled 2016-06-03: qty 1.5

## 2016-06-03 MED ORDER — PHENOL 1.4 % MT LIQD: 1.0000 | OROMUCOSAL | Status: DC | PRN

## 2016-06-03 MED ORDER — ENOXAPARIN SODIUM 40 MG/0.4ML ~~LOC~~ SOLN
40.0000 mg | SUBCUTANEOUS | Status: DC
  Filled 2016-06-03: qty 0.4

## 2016-06-03 MED ORDER — GUAIFENESIN-DM 100-10 MG/5ML PO SYRP: 15.0000 mL | ORAL_SOLUTION | ORAL | Status: DC | PRN

## 2016-06-03 MED ORDER — ONDANSETRON HCL 4 MG/2ML IJ SOLN
INTRAMUSCULAR | Status: DC | PRN
  Administered 2016-06-03: 4 mg via INTRAVENOUS

## 2016-06-03 MED ORDER — LABETALOL HCL 5 MG/ML IV SOLN
10.0000 mg | INTRAVENOUS | Status: DC | PRN
  Administered 2016-06-03 (×2): 10 mg via INTRAVENOUS
  Filled 2016-06-03: qty 4

## 2016-06-03 MED ORDER — EPHEDRINE SULFATE-NACL 50-0.9 MG/10ML-% IV SOSY
PREFILLED_SYRINGE | INTRAVENOUS | Status: DC | PRN
  Administered 2016-06-03: 10 mg via INTRAVENOUS

## 2016-06-03 MED ORDER — HYDROCHLOROTHIAZIDE 12.5 MG PO CAPS
12.5000 mg | ORAL_CAPSULE | Freq: Every day | ORAL | Status: DC
  Administered 2016-06-03 – 2016-06-04 (×2): 12.5 mg via ORAL
  Filled 2016-06-03 (×2): qty 1

## 2016-06-03 MED ORDER — DEXTROSE 5 % IV SOLN
1.5000 g | Freq: Two times a day (BID) | INTRAVENOUS | Status: AC
  Administered 2016-06-03 – 2016-06-04 (×2): 1.5 g via INTRAVENOUS
  Filled 2016-06-03 (×2): qty 1.5

## 2016-06-03 SURGICAL SUPPLY — 61 items
ADH SKN CLS APL DERMABOND .7 (GAUZE/BANDAGES/DRESSINGS) ×2
BLADE CLIPPER SURG (BLADE) ×3 IMPLANT
CANISTER SUCT 3000ML PPV (MISCELLANEOUS) ×3 IMPLANT
CATH ANGIO 5F BER2 65CM (CATHETERS) ×2 IMPLANT
CATH BEACON 5.038 65CM KMP-01 (CATHETERS) IMPLANT
CATH OMNI FLUSH .035X70CM (CATHETERS) ×2 IMPLANT
COVER PROBE W GEL 5X96 (DRAPES) ×3 IMPLANT
DERMABOND ADVANCED (GAUZE/BANDAGES/DRESSINGS) ×4
DERMABOND ADVANCED .7 DNX12 (GAUZE/BANDAGES/DRESSINGS) ×1 IMPLANT
DEVICE CLOSURE PERCLS PRGLD 6F (VASCULAR PRODUCTS) ×4 IMPLANT
DRAPE ZERO GRAVITY STERILE (DRAPES) ×3 IMPLANT
DRSG TEGADERM 2-3/8X2-3/4 SM (GAUZE/BANDAGES/DRESSINGS) ×6 IMPLANT
DRSG TEGADERM 4X4.75 (GAUZE/BANDAGES/DRESSINGS) ×4 IMPLANT
DRYSEAL FLEXSHEATH 12FR 33CM (SHEATH) ×2
DRYSEAL FLEXSHEATH 16FR 33CM (SHEATH) ×2
ELECT REM PT RETURN 9FT ADLT (ELECTROSURGICAL) ×6
ELECTRODE REM PT RTRN 9FT ADLT (ELECTROSURGICAL) ×2 IMPLANT
EXCLUDER TNK LEG 26MX14X14 (Endovascular Graft) IMPLANT
EXCLUDER TRUNK LEG 26MX14X14 (Endovascular Graft) ×3 IMPLANT
EXTENDER ENDOPROSTHESIS 14X7 (Endovascular Graft) ×2 IMPLANT
GAUZE SPONGE 2X2 8PLY STRL LF (GAUZE/BANDAGES/DRESSINGS) ×2 IMPLANT
GAUZE SPONGE 4X4 12PLY STRL LF (GAUZE/BANDAGES/DRESSINGS) ×6 IMPLANT
GLOVE BIOGEL PI IND STRL 7.5 (GLOVE) ×1 IMPLANT
GLOVE BIOGEL PI INDICATOR 7.5 (GLOVE) ×2
GLOVE SURG SS PI 7.5 STRL IVOR (GLOVE) ×3 IMPLANT
GOWN STRL REUS W/ TWL LRG LVL3 (GOWN DISPOSABLE) ×2 IMPLANT
GOWN STRL REUS W/ TWL XL LVL3 (GOWN DISPOSABLE) ×1 IMPLANT
GOWN STRL REUS W/TWL LRG LVL3 (GOWN DISPOSABLE) ×6
GOWN STRL REUS W/TWL XL LVL3 (GOWN DISPOSABLE) ×3
GRAFT BALLN CATH 65CM (STENTS) IMPLANT
HEMOSTAT SNOW SURGICEL 2X4 (HEMOSTASIS) IMPLANT
KIT BASIN OR (CUSTOM PROCEDURE TRAY) ×3 IMPLANT
KIT ROOM TURNOVER OR (KITS) ×3 IMPLANT
LEG CONTRALATERAL 16X14.5X12 (Vascular Products) ×2 IMPLANT
NDL PERC 18GX7CM (NEEDLE) ×1 IMPLANT
NEEDLE PERC 18GX7CM (NEEDLE) ×3 IMPLANT
NS IRRIG 1000ML POUR BTL (IV SOLUTION) ×3 IMPLANT
PACK ENDOVASCULAR (PACKS) ×3 IMPLANT
PAD ARMBOARD 7.5X6 YLW CONV (MISCELLANEOUS) ×6 IMPLANT
PERCLOSE PROGLIDE 6F (VASCULAR PRODUCTS) ×12
SHEATH AVANTI 11CM 8FR (MISCELLANEOUS) ×2 IMPLANT
SHEATH BRITE TIP 8FR 23CM (MISCELLANEOUS) ×3 IMPLANT
SHEATH DRYSEAL FLEX 12FR 33CM (SHEATH) ×1 IMPLANT
SHEATH DRYSEAL FLEX 16FR 33CM (SHEATH) ×1 IMPLANT
SHIELD RADPAD SCOOP 12X17 (MISCELLANEOUS) ×6 IMPLANT
SPONGE GAUZE 2X2 STER 10/PKG (GAUZE/BANDAGES/DRESSINGS) ×4
STENT GRAFT BALLN CATH 65CM (STENTS) ×2
STOPCOCK MORSE 400PSI 3WAY (MISCELLANEOUS) ×5 IMPLANT
SUT ETHILON 3 0 PS 1 (SUTURE) IMPLANT
SUT PROLENE 5 0 C 1 24 (SUTURE) IMPLANT
SUT VIC AB 2-0 CT1 27 (SUTURE)
SUT VIC AB 2-0 CT1 TAPERPNT 27 (SUTURE) IMPLANT
SUT VIC AB 3-0 SH 27 (SUTURE)
SUT VIC AB 3-0 SH 27X BRD (SUTURE) IMPLANT
SUT VICRYL 4-0 PS2 18IN ABS (SUTURE) ×6 IMPLANT
SYR 30ML LL (SYRINGE) ×3 IMPLANT
TRAY FOLEY W/METER SILVER 16FR (SET/KITS/TRAYS/PACK) ×3 IMPLANT
TUBING HIGH PRESSURE 120CM (CONNECTOR) ×3 IMPLANT
WIRE AMPLATZ SS-J .035X180CM (WIRE) ×4 IMPLANT
WIRE BENTSON .035X145CM (WIRE) ×6 IMPLANT
WIRE MINI STICK MAX (SHEATH) ×2 IMPLANT

## 2016-06-03 NOTE — Anesthesia Procedure Notes (Signed)
Arterial Line Insertion Start/End4/25/2018 7:00 AM, 06/03/2016 7:06 AM Performed by: Teressa Lower, CRNA  Patient location: Pre-op. Preanesthetic checklist: patient identified, IV checked, site marked, risks and benefits discussed, surgical consent, monitors and equipment checked, pre-op evaluation and timeout performed Lidocaine 1% used for infiltration Left, radial was placed Catheter size: 20 G Hand hygiene performed  and Seldinger technique used Allen's test indicative of satisfactory collateral circulation Attempts: 1 Procedure performed without using ultrasound guided technique. Patient tolerated the procedure well with no immediate complications.

## 2016-06-03 NOTE — Interval H&P Note (Signed)
History and Physical Interval Note:  06/03/2016 7:27 AM  Justin [name]  has presented today for surgery, with the diagnosis of AAA I71.4  The various methods of treatment have been discussed with the patient and family. After consideration of risks, benefits and other options for treatment, the patient has consented to  Procedure(s): ABDOMINAL AORTIC ENDOVASCULAR STENT GRAFT (N/A) as a surgical intervention .  The patient's history has been reviewed, patient examined, no change in status, stable for surgery.  I have reviewed the patient's chart and labs.  Questions were answered to the patient's satisfaction.     [name]

## 2016-06-03 NOTE — Transfer of Care (Signed)
Immediate Anesthesia Transfer of Care Note  Patient: Justin [name]  Procedure(s) Performed: Procedure(s): ABDOMINAL AORTIC ENDOVASCULAR STENT GRAFT insertion (N/A)  Patient Location: PACU  Anesthesia Type:General  Level of Consciousness: awake, alert  and oriented  Airway & Oxygen Therapy: Patient Spontanous Breathing and Patient connected to nasal cannula oxygen  Post-op Assessment: Report given to RN and Post -op Vital signs reviewed and stable  Post vital signs: Reviewed and stable  Last Vitals:  Vitals:   06/03/16 0629  BP: (!) 148/69  Pulse: 65  Resp: 20  Temp: 36.5 C    Last Pain: There were no vitals filed for this visit.    Patients Stated Pain Goal: 3 (24/09/73 5329)  Complications: No apparent anesthesia complications

## 2016-06-03 NOTE — Anesthesia Procedure Notes (Signed)
Procedure Name: Intubation Date/Time: 06/03/2016 7:52 AM Performed by: Mariea Clonts Pre-anesthesia Checklist: Patient identified, Emergency Drugs available, Suction available and Patient being monitored Patient Re-evaluated:Patient Re-evaluated prior to inductionOxygen Delivery Method: Circle System Utilized Preoxygenation: Pre-oxygenation with 100% oxygen Intubation Type: IV induction Ventilation: Mask ventilation without difficulty Laryngoscope Size: Mac and 4 Grade View: Grade I Tube type: Oral Number of attempts: 1 Airway Equipment and Method: Stylet and Oral airway Placement Confirmation: ETT inserted through vocal cords under direct vision,  positive ETCO2 and breath sounds checked- equal and bilateral Secured at: 22 cm Tube secured with: Tape Dental Injury: Teeth and Oropharynx as per pre-operative assessment

## 2016-06-03 NOTE — Anesthesia Postprocedure Evaluation (Signed)
Anesthesia Post Note  Patient: Justin [name]  Procedure(s) Performed: Procedure(s) (LRB): ABDOMINAL AORTIC ENDOVASCULAR STENT GRAFT insertion (N/A)  Patient location during evaluation: PACU Anesthesia Type: General Level of consciousness: awake and alert Pain management: pain level controlled Vital Signs Assessment: post-procedure vital signs reviewed and stable Respiratory status: spontaneous breathing, nonlabored ventilation, respiratory function stable and patient connected to nasal cannula oxygen Cardiovascular status: blood pressure returned to baseline and stable Postop Assessment: no signs of nausea or vomiting Anesthetic complications: no       Last Vitals:  Vitals:   06/03/16 1315 06/03/16 1330  BP: (!) 160/67 (!) 156/74  Pulse: 85 78  Resp: (!) 8 19  Temp:  36.6 C    Last Pain: There were no vitals filed for this visit.               Tiajuana Amass

## 2016-06-03 NOTE — Plan of Care (Signed)
Problem: Safety: Goal: Ability to remain free from injury will improve Outcome: Progressing Pt stating understands reason to call for help and how to do it.

## 2016-06-03 NOTE — H&P (View-Only) (Signed)
Vascular and Vein Specialist of [facility]  Patient name: Justin [name] MRN: [id number] DOB: Jul 08, 1923 Sex: male   REASON FOR VISIT:    AAA  HISOTRY OF PRESENT ILLNESS:    Justin [name] is a 83 y.o. male who returns today for follow up of his AAA. His most recent u/s revealed a increase in size.  He was sent for CTA.  The patient has a history multiple back surgeries.  He is without pain today.  He denies any abdominal pain.  He does not endorse symptoms of claudication.He denies any neurologic symptoms.     PAST MEDICAL HISTORY:   Past Medical History:  Diagnosis Date  . AAA (abdominal aortic aneurysm) ([place])    "we've known about it since ~ 2000"  . Arthritis    in back and hands:  Gout  . Atrial fibrillation ([place])    a. 04/2011 post op  . Bronchitis    hx of; "once or twice in my life" (04/21/11)  . Chronic kidney disease   . Chronic lower back pain 1995  . GERD (gastroesophageal reflux disease)   . Gout of big toe    "h/o in both"  . Hiatal hernia   . Hypertension    Takes lisinopril/hctz  . Lumbar spondylosis   . Lumbar stenosis    a. s/p  anterolateral decompression and lateral plating 04/21/2011  . Migraine    "left ~ 1980's"  . Stomach ulcer    "years ago; treated w/diet"      FAMILY HISTORY:   Family History  Problem Relation Age of Onset  . Alzheimer's disease Mother     died @ 54  . COPD Father     died @ 44  . Cancer Father   . Hypertension Father   . Heart disease Father   . Stroke Sister   . Cancer Brother     stomach  . Heart disease Brother     Aneurysm  . Anesthesia problems Neg Hx   . Hypotension Neg Hx   . Malignant hyperthermia Neg Hx   . Pseudochol deficiency Neg Hx   . Hypertension Son   . Heart disease Son     Aneurysm  . Hypertension Son   . Heart disease Son     Aneurysm    SOCIAL HISTORY:   Social History  Substance Use Topics  . Smoking status: Former Smoker    Packs/day:  1.00    Years: 20.00    Types: Cigarettes    Quit date: 02/10/1980  . Smokeless tobacco: Never Used  . Alcohol use 0.0 oz/week     Comment: 04/21/11 'bottle of wine/year"     ALLERGIES:   Allergies  Allergen Reactions  . Demerol Anaphylaxis  . Meperidine Anaphylaxis     CURRENT MEDICATIONS:   Current Outpatient Prescriptions  Medication Sig Dispense Refill  . allopurinol (ZYLOPRIM) 300 MG tablet daily. To prevent Gout  3  . aspirin EC 81 MG tablet Take 81 mg by mouth daily.     . brimonidine (ALPHAGAN) 0.2 % ophthalmic solution     . COLCRYS 0.6 MG tablet as needed. Gout    . dorzolamide-timolol (COSOPT) 22.3-6.8 MG/ML ophthalmic solution Place 1 drop into both eyes 2 (two) times daily.     Marland Kitchen dutasteride (AVODART) 0.5 MG capsule Take 0.5 mg by mouth 2 (two) times a week.    Marland Kitchen lisinopril-hydrochlorothiazide (PRINZIDE,ZESTORETIC) 20-12.5 MG per tablet Take 1 tablet by mouth daily.    Marland Kitchen  LUMIGAN 0.01 % SOLN Place into both eyes daily.   3  . triamcinolone cream (KENALOG) 0.1 %     . ULORIC 80 MG TABS daily. New medication for Gout  3  . metoprolol tartrate (LOPRESSOR) 25 MG tablet Take 1 tablet (25 mg total) by mouth 2 (two) times daily. 60 tablet 3   No current facility-administered medications for this visit.     REVIEW OF SYSTEMS:   [X]  denotes positive finding, [ ]  denotes negative finding Cardiac  Comments:  Chest pain or chest pressure:    Shortness of breath upon exertion:    Short of breath when lying flat:    Irregular heart rhythm:        Vascular    Pain in calf, thigh, or hip brought on by ambulation:    Pain in feet at night that wakes you up from your sleep:     Blood clot in your veins:    Leg swelling:         Pulmonary    Oxygen at home:    Productive cough:     Wheezing:         Neurologic    Sudden weakness in arms or legs:     Sudden numbness in arms or legs:     Sudden onset of difficulty speaking or slurred speech:    Temporary loss of  vision in one eye:     Problems with dizziness:         Gastrointestinal    Blood in stool:     Vomited blood:         Genitourinary    Burning when urinating:     Blood in urine:        Psychiatric    Major depression:         Hematologic    Bleeding problems:    Problems with blood clotting too easily:        Skin    Rashes or ulcers:        Constitutional    Fever or chills:      PHYSICAL EXAM:   Vitals:   05/25/16 1029  BP: (!) 156/89  Pulse: 67  Resp: 20  Temp: 97.3 F (36.3 C)  TempSrc: Oral  SpO2: 99%  Weight: 154 lb 12.8 oz (70.2 kg)  Height: 5\' 10"  (1.778 m)    GENERAL: The patient is a well-nourished male, in no acute distress. The vital signs are documented above. CARDIAC: There is a regular rate and rhythm.  VASCULAR: Palpable right posterior tibial.  I cannot palpate the left.  No carotid bruit PULMONARY: Non-labored respirations ABDOMEN: Soft and non-tender with normal pitched bowel sounds.  MUSCULOSKELETAL: There are no major deformities or cyanosis. NEUROLOGIC: No focal weakness or paresthesias are detected. SKIN: There are no ulcers or rashes noted. PSYCHIATRIC: The patient has a normal affect.  STUDIES:   Due to his CT angiogram.  This reveals a 5.5 cm infrarenal abdominal aortic aneurysm without evidence of rupture.  MEDICAL ISSUES:   AAA:  We had a lengthy discussion regarding our treatment options of continued observation versus endovascular repair.  Ultimately, we decided to proceed with endovascular repair.  I discussed the risks and benefits of the operation which are included but not limited to the risk of death, cardiopulmonary complications, renal insufficiency, intestinal ischemia, lower extremity ischemia.  We also discussed endoleak.  All his questions were answered.  He wishes to proceed.  His operations been scheduled  for Wednesday, April 25.  I am ordering a Myoview as well as carotid Dopplers and lower extremity  Dopplers.    [name], MD Vascular and Vein Specialists of [facility] [phone] Pager [phone]

## 2016-06-04 ENCOUNTER — Encounter (HOSPITAL_COMMUNITY): Payer: Self-pay | Admitting: Surgery

## 2016-06-04 ENCOUNTER — Other Ambulatory Visit: Payer: Self-pay | Admitting: *Deleted

## 2016-06-04 DIAGNOSIS — I714 Abdominal aortic aneurysm, without rupture, unspecified: Secondary | ICD-10-CM

## 2016-06-04 DIAGNOSIS — Z48812 Encounter for surgical aftercare following surgery on the circulatory system: Secondary | ICD-10-CM

## 2016-06-04 LAB — BASIC METABOLIC PANEL
ANION GAP: 9 (ref 5–15)
BUN: 19 mg/dL (ref 6–20)
CALCIUM: 8.5 mg/dL — AB (ref 8.9–10.3)
CO2: 24 mmol/L (ref 22–32)
Chloride: 105 mmol/L (ref 101–111)
Creatinine, Ser: 1.12 mg/dL (ref 0.61–1.24)
GFR calc non Af Amer: 55 mL/min — ABNORMAL LOW (ref >60)
GLUCOSE: 124 mg/dL — AB (ref 65–99)
POTASSIUM: 3.8 mmol/L (ref 3.5–5.1)
SODIUM: 138 mmol/L (ref 135–145)

## 2016-06-04 LAB — CBC
HCT: 37.5 % — ABNORMAL LOW (ref 39.0–52.0)
Hemoglobin: 12.3 g/dL — ABNORMAL LOW (ref 13.0–17.0)
MCH: 31.1 pg (ref 26.0–34.0)
MCHC: 32.8 g/dL (ref 30.0–36.0)
MCV: 94.7 fL (ref 78.0–100.0)
PLATELETS: 147 10*3/uL — AB (ref 150–400)
RBC: 3.96 MIL/uL — AB (ref 4.22–5.81)
RDW: 13.5 % (ref 11.5–15.5)
WBC: 7.5 10*3/uL (ref 4.0–10.5)

## 2016-06-04 MED ORDER — BISACODYL 5 MG PO TBEC: 5.0000 mg | DELAYED_RELEASE_TABLET | Freq: Every day | ORAL | 0 refills | Status: DC | PRN

## 2016-06-04 MED ORDER — OXYCODONE-ACETAMINOPHEN 5-325 MG PO TABS: 1.0000 | ORAL_TABLET | ORAL | 0 refills | Status: DC | PRN

## 2016-06-04 NOTE — Care Management Note (Signed)
Case Management Note  Patient Details  Name: Justin [name] MRN: [id number] Date of Birth: 24-Nov-1923  Subjective/Objective:   is s/p: EVAR, from home with wife, pta indep, for dc today, no needs.                 Action/Plan:   Expected Discharge Date:  06/04/16               Expected Discharge Plan:  Home/Self Care  In-House Referral:     Discharge planning Services  CM Consult  Post Acute Care Choice:    Choice offered to:     DME Arranged:    DME Agency:     HH Arranged:    HH Agency:     Status of Service:  Completed, signed off  If discussed at [employer] of Stay Meetings, dates discussed:    Additional Comments:  Zenon Mayo, RN 06/04/2016, 10:23 AM

## 2016-06-04 NOTE — Op Note (Signed)
Patient name: Justin [name] MRN: [id number] DOB: [birth year] Sex: male  06/03/2016 Pre-operative Diagnosis: AAA Post-operative diagnosis:  Same Surgeon:  [name] Assistants:  Gerri Lins Procedure:   #1: Endovascular repair of abdominal aortic aneurysm   #2: Bilateral ultrasound-guided common femoral artery access   #3: Catheter in aorta 2   #4: Abdominal aortogram   #5: Distal extension 1 Anesthesia:  Gen. Blood Loss:  See anesthesia record Specimens:  None  Findings:  Complete exclusion, type II endoleak Devices used:  Main body was primary left Gore 26x14x14.  Ipsilateral left extension 16x14x7.  Contralateral right 14x12  Indications:  This is a very healthy 83 year old gentleman with a infrarenal abdominal aortic aneurysm who is here today for repair.  Procedure:  The patient was identified in the holding area and taken to [place] 16  The patient was then placed supine on the table. general anesthesia was administered.  The patient was prepped and draped in the usual sterile fashion.  A time out was called and antibiotics were administered.  Ultrasound was used to evaluate bilateral common femoral arteries.  They were patent without significant disease.  A 11 blade was used to make a skin nick.  Using ultrasound guidance, a micropuncture needle was used to cannulate bilateral common femoral arteries.  Roxy Manns a wires were advanced without resistance followed by micropuncture sheath.  A 035 wire was then inserted and the subcutaneous tract was dilated with an 8 [place] dilator.  Provide devices were deployed at the 11:00 and 1:00 position for pre-closure.  I did go through several devices on the right side.  The patient was then fully heparinized  A 16 French sheath was advanced over a Amplatz superstiff wire on the left side.  A Omni flush catheter was advanced up the right side and positioned at L1.  An abdominal aortogram was performed to locate the renal arteries.  Next,  the main body device was prepared on the back table.  This was a Sport and exercise psychologist  [id number] device.  It was then  inserted.  This was deployed landing at the level of the lowest left renal artery.  Next, the Omni flush catheter and Bentson wire was used to cannulate the contralateral gate.  The catheter was able to be freely rotated within the device, confirming successful cannulation.  An Amplatz superstiff wire was inserted.  The image detector was rotated to a left anterior oblique position a retrograde injection was performed through the sheath in the right groin locating the right hypogastric artery.  Next, a 12 French sheath was inserted on the right side into the contralateral gate.  The contralateral limb was apparent on the back table.  This was a Arboriculturist device.  It was inserted and then deployed landing at the level of the right hypogastric artery.  Next, the remaining portion of the ipsilateral limb was deployed.  The image detector was rotated to a right anterior oblique position and a retrograde injection was performed through the sheath in the left groin locating the left hypogastric artery.  A Gore extension [id number] was then inserted and deployed landing at the level of the left hypogastric artery.  Next a Q-50 balloon was used to mold device overlap sent proximal and distal attachment sites.  A completion arteriogram was performed.  There did appear to be a type II endoleak from the inferior mesenteric artery.  I elected to reinsert the Q 50 balloon to mold the top portion of the graft  to make sure this was not a type I leak.  Final arteriogram was performed and was apparent that this was a type II leak.  Perfusion to both renal arteries persisted as well as to both external and internal iliac arteries.  Next the Amplatz superstiff wires were exchanged out for [facility] wires.  The sheaths were removed and the pro-glide 3 used to close the arteriotomy site.  50 mg protamine was given to reverse the heparin.   Manual pressure was held for about 5 minutes.  The patient had excellent Doppler signals in the feet.  He was successfully awoken from anesthesia and taken to recovery room in stable condition.  There were no immediate complications.   Disposition:  To PACU stable   V. [name], M.D. [facility] [place] Office: [phone] Pager:  [phone]

## 2016-06-04 NOTE — Progress Notes (Signed)
Discussed and explained discharge instructions to pt and son. Prescription and follow up visit given. Care notes on after care of AAA given. Pt going home with wife and son via w/c with belongings.

## 2016-06-04 NOTE — Progress Notes (Signed)
  [facility] Progress Note  Subjective  - POD #1  Having a lot of flatulence. Denies abdominal pain.  Objective Vitals:   06/03/16 2256 06/03/16 2300  BP: 128/64   Pulse: 78 80  Resp: 11 (!) 21  Temp: 98.4 F (36.9 C)     Intake/Output Summary (Last 24 hours) at 06/04/16 0734 Last data filed at 06/04/16 0600  Gross per 24 hour  Intake           2757.5 ml  Output             1925 ml  Net            832.5 ml   Abdomen soft and non tender Groins without hematoma Palpable DP pulse bilaterally   Assessment/Planning: 83 y.o. male is s/p: [facility] 1 Day Post-Op   Doing well post-op. Needs to void and ambulate. Creatinine normal.  D/c home after voiding, ambulating and breakfast. F/u in 4 weeks with CTA.   Alvia Grove 06/04/2016 7:34 AM --  Laboratory CBC    Component Value Date/Time   WBC 7.5 06/04/2016 0520   HGB 12.3 (L) 06/04/2016 0520   HCT 37.5 (L) 06/04/2016 0520   PLT 147 (L) 06/04/2016 0520    BMET    Component Value Date/Time   NA 138 06/04/2016 0520   K 3.8 06/04/2016 0520   CL 105 06/04/2016 0520   CO2 24 06/04/2016 0520   GLUCOSE 124 (H) 06/04/2016 0520   BUN 19 06/04/2016 0520   CREATININE 1.12 06/04/2016 0520   CALCIUM 8.5 (L) 06/04/2016 0520   GFRNONAA 55 (L) 06/04/2016 0520   GFRAA >60 06/04/2016 0520    COAG Lab Results  Component Value Date   INR 1.21 06/03/2016   INR 1.07 06/01/2016   No results found for: PTT  Antibiotics Anti-infectives    Start     Dose/Rate Route Frequency Ordered Stop   06/03/16 1800  cefUROXime (ZINACEF) 1.5 g in dextrose 5 % 50 mL IVPB     1.5 g 100 mL/hr over 30 Minutes Intravenous Every 12 hours 06/03/16 1013 06/04/16 0630   06/03/16 0624  dextrose 5 % with cefUROXime (ZINACEF) ADS Med    Comments:  H[place], Jessica   : cabinet override      06/03/16 0624 06/03/16 0752   06/03/16 0622  cefUROXime (ZINACEF) 1.5 g in dextrose 5 % 50 mL IVPB     1.5 g 100 mL/hr over 30 Minutes  Intravenous 30 min pre-op 06/03/16 0622 06/03/16 [place], PA-C [facility] Office: [phone] Pager: [phone] 06/04/2016 7:34 AM

## 2016-06-04 NOTE — Discharge Summary (Signed)
[facility] Discharge Summary  Justin [name] [birth year] 83 y.o. male  [id number]  Admission Date: 06/03/2016  Discharge Date: 06/04/2016  Physician: [name], MD  Admission Diagnosis: AAA I71.4  HPI:   This is a 83 y.o. male who returned for follow up of his AAA. His most recent u/s revealed a increase in size.  He was sent for CTA.  The patient has a history multiple back surgeries.  He is without pain today.  He denies any abdominal pain.  He does not endorse symptoms of claudication.He denies any neurologic symptoms.    Hospital Course:  The patient was admitted to the hospital and taken to the operating room on 06/03/2016 and underwent: #1: Endovascular repair of abdominal aortic aneurysm #2: Bilateral ultrasound-guided common femoral artery access #3: Catheter in aorta 2 #4: Abdominal aortogram #5: Distal extension 1  Completion arteriogram findings: complete exclusion, type II endoleak  The patient tolerated the procedure well and was transported to the PACU in stable condition.   The patient was doing well on POD 1. His groins were without hematoma and he had palpable dorsalis pedis pulses bilaterally. He was ambulating, voiding and tolerating a diet without difficulty. He was discharged home on POD 1 in good condition.     CBC    Component Value Date/Time   WBC 7.5 06/04/2016 0520   RBC 3.96 (L) 06/04/2016 0520   HGB 12.3 (L) 06/04/2016 0520   HCT 37.5 (L) 06/04/2016 0520   PLT 147 (L) 06/04/2016 0520   MCV 94.7 06/04/2016 0520   MCH 31.1 06/04/2016 0520   MCHC 32.8 06/04/2016 0520   RDW 13.5 06/04/2016 0520    BMET    Component Value Date/Time   NA 138 06/04/2016 0520   K 3.8 06/04/2016 0520   CL 105 06/04/2016 0520   CO2 24 06/04/2016 0520   GLUCOSE 124 (H) 06/04/2016 0520   BUN 19 06/04/2016 0520   CREATININE 1.12 06/04/2016 0520   CALCIUM 8.5 (L) 06/04/2016 0520   GFRNONAA 55 (L) 06/04/2016 0520   GFRAA >60  06/04/2016 0520     Discharge Instructions:   The patient is discharged to home with extensive instructions on wound care and progressive ambulation.  They are instructed not to drive or perform any heavy lifting until returning to see the physician in his office.  Discharge Instructions    Call MD for:  redness, tenderness, or signs of infection (pain, swelling, bleeding, redness, odor or green/yellow discharge around incision site)    Complete by:  As directed    Call MD for:  severe or increased pain, loss or decreased feeling  in affected limb(s)    Complete by:  As directed    Call MD for:  temperature >100.5    Complete by:  As directed    Discharge wound care:    Complete by:  As directed    Take off dressings when you get home. Shower daily and wash groins with soap and water and pat dry. You do not have to reapply a dressing.   Driving Restrictions    Complete by:  As directed    No driving while having pain with groins and while on pain medication   Increase activity slowly    Complete by:  As directed    Walk with assistance use walker or cane as needed   Lifting restrictions    Complete by:  As directed    No lifting greater than 5  lbs for 4 weeks   Resume previous diet    Complete by:  As directed       Discharge Diagnosis:  AAA I71.4  Secondary Diagnosis: Patient Active Problem List   Diagnosis Date Noted  . AAA (abdominal aortic aneurysm) ([place]) 06/03/2016  . Atrial fibrillation ([place]) 04/23/2011  . Abdominal aneurysm without mention of rupture 02/23/2011   Past Medical History:  Diagnosis Date  . AAA (abdominal aortic aneurysm) ([place])    "we've known about it since ~ 2000"  . AAA (abdominal aortic aneurysm) ([place]) 06/03/2016  . Arthritis    in back and hands:  Gout  . Atrial fibrillation ([place])    a. 04/2011 post op  . BPH (benign prostatic hyperplasia)   . Bronchitis    hx of; "once or twice in my life" (04/21/11)  . Cancer ([place])    Basal Cell  carcinoma- per Dr Jacquiline Doe office notes  . Chronic kidney disease    stage 3 - per Dr Jacquiline Doe office notes  . Chronic lower back pain 1995  . Eczema   . GERD (gastroesophageal reflux disease)   . Gout of big toe    "h/o in both"  . Hiatal hernia   . History of kidney stones   . Hypertension    Takes lisinopril/hctz  . Lumbar spondylosis   . Lumbar stenosis    a. s/p  anterolateral decompression and lateral plating 04/21/2011  . Migraine    "left ~ 1980's"  . Stomach ulcer    "years ago; treated w/diet"      Allergies as of 06/04/2016      Reactions   Demerol Anaphylaxis   Meperidine Anaphylaxis      Medication List    STOP taking these medications   metoprolol tartrate 25 MG tablet Commonly known as:  LOPRESSOR     TAKE these medications   aspirin EC 81 MG tablet Take 81 mg by mouth daily.   bisacodyl 5 MG EC tablet Commonly known as:  DULCOLAX Take 1 tablet (5 mg total) by mouth daily as needed for moderate constipation.   brimonidine 0.2 % ophthalmic solution Commonly known as:  ALPHAGAN Place 1 drop into both eyes 2 (two) times daily.   dorzolamide-timolol 22.3-6.8 MG/ML ophthalmic solution Commonly known as:  COSOPT Place 1 drop into both eyes 2 (two) times daily.   dutasteride 0.5 MG capsule Commonly known as:  AVODART Take 0.5 mg by mouth 2 (two) times a week. Monday and Fridays   lisinopril-hydrochlorothiazide 20-12.5 MG tablet Commonly known as:  PRINZIDE,ZESTORETIC Take 1 tablet by mouth daily.   LUMIGAN 0.01 % Soln Generic drug:  bimatoprost Place 1 drop into both eyes 2 (two) times a week. Mondays and Fridays   oxyCODONE-acetaminophen 5-325 MG tablet Commonly known as:  PERCOCET/ROXICET Take 1-2 tablets by mouth every 4 (four) hours as needed for moderate pain.   triamcinolone cream 0.1 % Commonly known as:  KENALOG Apply 1 application topically daily as needed (rash).       Percocet #10 No Refill  Disposition: Home  Patient's  condition: is Good  Follow up: 1. Dr. Trula Slade in 4 weeks with CTA   Virgina Jock, PA-C [facility] [phone] 06/04/2016  3:30 PM   - For VQI Registry use --- Instructions: Press F2 to tab through selections.  Delete question if not applicable.   Post-op:  Time to Extubation: [x ] In OR, [ ]  < 12 hrs, [ ]  12-24 hrs, [ ]  >=  24 hrs Vasopressors Req. Post-op: No MI: No., [ ]  Troponin only, [ ]  EKG or Clinical New Arrhythmia: No CHF: No ICU Stay: 0 days Transfusion: No   Complications: Resp failure: No., [ ]  Pneumonia, [ ]  Ventilator Chg in renal function: No., [ ]  Inc. Cr > 0.5, [ ]  Temp. Dialysis, [ ]  Permanent dialysis Leg ischemia: No., no Surgery needed, [ ]  Yes, Surgery needed, [ ]  Amputation Bowel ischemia: No., [ ]  Medical Rx, [ ]  Surgical Rx Wound complication: No., [ ]  Superficial separation/infection, [ ]  Return to OR Return to OR: No  Return to OR for bleeding: No Stroke: No., [ ]  Minor, [ ]  Major  Discharge medications: Statin use:  No If No: [ ]  For Medical reasons, [ ]  Non-compliant, [ x] Not-indicated ASA use:  Yes  If No: [ ]  For Medical reasons, [ ]  Non-compliant, [ ]  Not-indicated Plavix use:  No If No: [ ]  For Medical reasons, [ ]  Non-compliant, [x ] Not-indicated Beta blocker use:  No If No: [ ]  For Medical reasons, [ ]  Non-compliant, [ x] Not-indicated

## 2016-06-05 ENCOUNTER — Telehealth: Payer: Self-pay | Admitting: *Deleted

## 2016-06-05 NOTE — Telephone Encounter (Signed)
Wife called stating that patient was not feeling as good as he had yesterday,  Denies abdominal, groin pain,no fever or nausea.  Her concern with his blood pressure being 110/57 at noon after having lunch.  States that he has not had more than 16 ounces of liquid today.  I encouraged increasing his fluid intake and including gatorade to his intake.  Patient voiced understanding of the instructions and confirms that she will call if his symptoms should worsen.

## 2016-06-06 DIAGNOSIS — I129 Hypertensive chronic kidney disease with stage 1 through stage 4 chronic kidney disease, or unspecified chronic kidney disease: Secondary | ICD-10-CM | POA: Diagnosis not present

## 2016-06-06 DIAGNOSIS — Z48812 Encounter for surgical aftercare following surgery on the circulatory system: Secondary | ICD-10-CM | POA: Diagnosis not present

## 2016-06-06 DIAGNOSIS — N183 Chronic kidney disease, stage 3 (moderate): Secondary | ICD-10-CM | POA: Diagnosis not present

## 2016-06-06 DIAGNOSIS — I4891 Unspecified atrial fibrillation: Secondary | ICD-10-CM | POA: Diagnosis not present

## 2016-06-06 DIAGNOSIS — M159 Polyosteoarthritis, unspecified: Secondary | ICD-10-CM | POA: Diagnosis not present

## 2016-06-06 DIAGNOSIS — M545 Low back pain: Secondary | ICD-10-CM | POA: Diagnosis not present

## 2016-06-08 DIAGNOSIS — N183 Chronic kidney disease, stage 3 (moderate): Secondary | ICD-10-CM | POA: Diagnosis not present

## 2016-06-08 DIAGNOSIS — I129 Hypertensive chronic kidney disease with stage 1 through stage 4 chronic kidney disease, or unspecified chronic kidney disease: Secondary | ICD-10-CM | POA: Diagnosis not present

## 2016-06-08 DIAGNOSIS — M159 Polyosteoarthritis, unspecified: Secondary | ICD-10-CM | POA: Diagnosis not present

## 2016-06-08 DIAGNOSIS — M545 Low back pain: Secondary | ICD-10-CM | POA: Diagnosis not present

## 2016-06-08 DIAGNOSIS — Z48812 Encounter for surgical aftercare following surgery on the circulatory system: Secondary | ICD-10-CM | POA: Diagnosis not present

## 2016-06-08 DIAGNOSIS — I4891 Unspecified atrial fibrillation: Secondary | ICD-10-CM | POA: Diagnosis not present

## 2016-06-10 DIAGNOSIS — M545 Low back pain: Secondary | ICD-10-CM | POA: Diagnosis not present

## 2016-06-10 DIAGNOSIS — Z48812 Encounter for surgical aftercare following surgery on the circulatory system: Secondary | ICD-10-CM | POA: Diagnosis not present

## 2016-06-10 DIAGNOSIS — I4891 Unspecified atrial fibrillation: Secondary | ICD-10-CM | POA: Diagnosis not present

## 2016-06-10 DIAGNOSIS — I129 Hypertensive chronic kidney disease with stage 1 through stage 4 chronic kidney disease, or unspecified chronic kidney disease: Secondary | ICD-10-CM | POA: Diagnosis not present

## 2016-06-10 DIAGNOSIS — N183 Chronic kidney disease, stage 3 (moderate): Secondary | ICD-10-CM | POA: Diagnosis not present

## 2016-06-10 DIAGNOSIS — M159 Polyosteoarthritis, unspecified: Secondary | ICD-10-CM | POA: Diagnosis not present

## 2016-06-12 DIAGNOSIS — I4891 Unspecified atrial fibrillation: Secondary | ICD-10-CM | POA: Diagnosis not present

## 2016-06-12 DIAGNOSIS — M159 Polyosteoarthritis, unspecified: Secondary | ICD-10-CM | POA: Diagnosis not present

## 2016-06-12 DIAGNOSIS — M545 Low back pain: Secondary | ICD-10-CM | POA: Diagnosis not present

## 2016-06-12 DIAGNOSIS — I129 Hypertensive chronic kidney disease with stage 1 through stage 4 chronic kidney disease, or unspecified chronic kidney disease: Secondary | ICD-10-CM | POA: Diagnosis not present

## 2016-06-12 DIAGNOSIS — Z48812 Encounter for surgical aftercare following surgery on the circulatory system: Secondary | ICD-10-CM | POA: Diagnosis not present

## 2016-06-12 DIAGNOSIS — N183 Chronic kidney disease, stage 3 (moderate): Secondary | ICD-10-CM | POA: Diagnosis not present

## 2016-06-18 DIAGNOSIS — M159 Polyosteoarthritis, unspecified: Secondary | ICD-10-CM | POA: Diagnosis not present

## 2016-06-18 DIAGNOSIS — Z48812 Encounter for surgical aftercare following surgery on the circulatory system: Secondary | ICD-10-CM | POA: Diagnosis not present

## 2016-06-18 DIAGNOSIS — N183 Chronic kidney disease, stage 3 (moderate): Secondary | ICD-10-CM | POA: Diagnosis not present

## 2016-06-18 DIAGNOSIS — I4891 Unspecified atrial fibrillation: Secondary | ICD-10-CM | POA: Diagnosis not present

## 2016-06-18 DIAGNOSIS — I129 Hypertensive chronic kidney disease with stage 1 through stage 4 chronic kidney disease, or unspecified chronic kidney disease: Secondary | ICD-10-CM | POA: Diagnosis not present

## 2016-06-18 DIAGNOSIS — M545 Low back pain: Secondary | ICD-10-CM | POA: Diagnosis not present

## 2016-06-23 DIAGNOSIS — I4891 Unspecified atrial fibrillation: Secondary | ICD-10-CM | POA: Diagnosis not present

## 2016-06-23 DIAGNOSIS — Z48812 Encounter for surgical aftercare following surgery on the circulatory system: Secondary | ICD-10-CM | POA: Diagnosis not present

## 2016-06-23 DIAGNOSIS — M545 Low back pain: Secondary | ICD-10-CM | POA: Diagnosis not present

## 2016-06-23 DIAGNOSIS — I129 Hypertensive chronic kidney disease with stage 1 through stage 4 chronic kidney disease, or unspecified chronic kidney disease: Secondary | ICD-10-CM | POA: Diagnosis not present

## 2016-06-23 DIAGNOSIS — M159 Polyosteoarthritis, unspecified: Secondary | ICD-10-CM | POA: Diagnosis not present

## 2016-06-23 DIAGNOSIS — N183 Chronic kidney disease, stage 3 (moderate): Secondary | ICD-10-CM | POA: Diagnosis not present

## 2016-06-26 DIAGNOSIS — Z48812 Encounter for surgical aftercare following surgery on the circulatory system: Secondary | ICD-10-CM | POA: Diagnosis not present

## 2016-06-26 DIAGNOSIS — N183 Chronic kidney disease, stage 3 (moderate): Secondary | ICD-10-CM | POA: Diagnosis not present

## 2016-06-26 DIAGNOSIS — M545 Low back pain: Secondary | ICD-10-CM | POA: Diagnosis not present

## 2016-06-26 DIAGNOSIS — I129 Hypertensive chronic kidney disease with stage 1 through stage 4 chronic kidney disease, or unspecified chronic kidney disease: Secondary | ICD-10-CM | POA: Diagnosis not present

## 2016-06-26 DIAGNOSIS — I4891 Unspecified atrial fibrillation: Secondary | ICD-10-CM | POA: Diagnosis not present

## 2016-06-26 DIAGNOSIS — M159 Polyosteoarthritis, unspecified: Secondary | ICD-10-CM | POA: Diagnosis not present

## 2016-07-09 ENCOUNTER — Ambulatory Visit
Admission: RE | Admit: 2016-07-09 | Discharge: 2016-07-09 | Disposition: A | Discharge status: Home / Self Care | Payer: Medicare Other | Source: Ambulatory Visit | Attending: Surgery | Admitting: Surgery

## 2016-07-09 DIAGNOSIS — I714 Abdominal aortic aneurysm, without rupture, unspecified: Secondary | ICD-10-CM

## 2016-07-09 DIAGNOSIS — Z48812 Encounter for surgical aftercare following surgery on the circulatory system: Secondary | ICD-10-CM

## 2016-07-09 MED ORDER — IOPAMIDOL (ISOVUE-370) INJECTION 76%
75.0000 mL | Freq: Once | INTRAVENOUS | Status: AC | PRN
  Administered 2016-07-09: 75 mL via INTRAVENOUS

## 2016-07-15 ENCOUNTER — Encounter: Payer: Self-pay | Admitting: Surgery

## 2016-07-20 ENCOUNTER — Ambulatory Visit (INDEPENDENT_AMBULATORY_CARE_PROVIDER_SITE_OTHER): Payer: Self-pay | Admitting: Surgery

## 2016-07-20 ENCOUNTER — Encounter: Payer: Self-pay | Admitting: Surgery

## 2016-07-20 VITALS — BP 139/86 | HR 78 | Temp 97.0°F | Resp 16 | Ht 70.0 in | Wt 155.0 lb

## 2016-07-20 DIAGNOSIS — I714 Abdominal aortic aneurysm, without rupture, unspecified: Secondary | ICD-10-CM

## 2016-07-20 NOTE — Progress Notes (Signed)
Patient name: ROI JAFARI MRN: [id number] DOB: Oct 20, 1923 Sex: male  REASON FOR VISIT:     post op  HISTORY OF PRESENT ILLNESS:   Jervis D Michiels is a 83 y.o. male who returns today for his first postoperative visit.  He is status post endovascular repair of a 5.5 cm infrarenal abdominal aortic aneurysm on 06/03/2016.  His postoperative course was uncomplicated.  The patient complains of some low back pain as well as some numbness in his legs which does not bother him that much.  He will take a ibuprofen in the morning and his symptoms resolved.  He has no other complaints.  CURRENT MEDICATIONS:    Current Outpatient Prescriptions  Medication Sig Dispense Refill  . aspirin EC 81 MG tablet Take 81 mg by mouth daily.     . bisacodyl (DULCOLAX) 5 MG EC tablet Take 1 tablet (5 mg total) by mouth daily as needed for moderate constipation. 30 tablet 0  . brimonidine (ALPHAGAN) 0.2 % ophthalmic solution Place 1 drop into both eyes 2 (two) times daily.     . dorzolamide-timolol (COSOPT) 22.3-6.8 MG/ML ophthalmic solution Place 1 drop into both eyes 2 (two) times daily.     Marland Kitchen dutasteride (AVODART) 0.5 MG capsule Take 0.5 mg by mouth 2 (two) times a week. Monday and Fridays    . lisinopril-hydrochlorothiazide (PRINZIDE,ZESTORETIC) 20-12.5 MG per tablet Take 1 tablet by mouth daily.    Marland Kitchen LUMIGAN 0.01 % SOLN Place 1 drop into both eyes 2 (two) times a week. Mondays and Fridays  3  . Naproxen Sodium (ALEVE) 220 MG CAPS Take by mouth.    . triamcinolone cream (KENALOG) 0.1 % Apply 1 application topically daily as needed (rash).      No current facility-administered medications for this visit.     REVIEW OF SYSTEMS:   [X]  denotes positive finding, [ ]  denotes negative finding Cardiac  Comments:  Chest pain or chest pressure:    Shortness of breath upon exertion:    Short of breath when lying flat:    Irregular heart rhythm:    Constitutional    Fever or  chills:      PHYSICAL EXAM:   Vitals:   07/20/16 1144  BP: 139/86  Pulse: 78  Resp: 16  Temp: 97 F (36.1 C)  TempSrc: Oral  SpO2: 97%  Weight: 155 lb (70.3 kg)  Height: 5\' 10"  (1.778 m)    GENERAL: The patient is a well-nourished male, in no acute distress. The vital signs are documented above. CARDIOVASCULAR: There is a regular rate and rhythm. PULMONARY: Non-labored respirations Palpable pedal pulses bilaterally.  No significant edema  STUDIES:   His CT scan shows that the stent graft is in good position without evidence of endoleak   MEDICAL ISSUES:   Status post endovascular repair of a 5.5 cm infrarenal abdominal aortic aneurysm: The patient is doing very well this time.  He has no concerns regarding his aneurysm.  I suspect his lower back pain, given his file prior surgeries is secondary to positioning on the operating room table.  I suspect this will resolve with time.  I contemplated a steroid Dosepak today, however I will wait another few months before doing that.  I have him scheduled to follow-up with me in 6 months with an ultrasound.  He will contact me sooner if he is not back to his baseline by then.  [name], MD [facility] [phone] Pager (  [phone]    

## 2016-07-31 NOTE — Addendum Note (Signed)
Addended by: Lianne Cure A on: 07/31/2016 11:03 AM   Modules accepted: Orders

## 2016-09-15 ENCOUNTER — Telehealth: Payer: Self-pay | Admitting: *Deleted

## 2016-09-15 NOTE — Telephone Encounter (Signed)
Wife called and left message, patient answered the phone when I returned the call.  He states that he is having back pain when he is standing or sitting, not when lying down. He states that it is lower back.  He explains that he is not one to complain of pain, however this has increased.  An appointment has been scheduled for 09/21/2016 @ 1:15 with NP.  Patient's wife was notified of the appointment and encouraged the patient to go to the ED if symptoms should worsen.  Mrs Schurman voiced understanding of these instructions.

## 2016-09-18 ENCOUNTER — Encounter: Payer: Self-pay | Admitting: Family

## 2016-09-21 ENCOUNTER — Other Ambulatory Visit: Payer: Self-pay

## 2016-09-21 ENCOUNTER — Encounter: Payer: Self-pay | Admitting: Family

## 2016-09-21 ENCOUNTER — Ambulatory Visit (INDEPENDENT_AMBULATORY_CARE_PROVIDER_SITE_OTHER): Payer: Medicare Other | Admitting: Family

## 2016-09-21 VITALS — BP 191/101 | HR 81 | Temp 97.2°F | Resp 20 | Ht 70.0 in | Wt 153.7 lb

## 2016-09-21 DIAGNOSIS — Z95828 Presence of other vascular implants and grafts: Secondary | ICD-10-CM

## 2016-09-21 DIAGNOSIS — M5442 Lumbago with sciatica, left side: Secondary | ICD-10-CM | POA: Diagnosis not present

## 2016-09-21 DIAGNOSIS — I714 Abdominal aortic aneurysm, without rupture, unspecified: Secondary | ICD-10-CM

## 2016-09-21 DIAGNOSIS — I4891 Unspecified atrial fibrillation: Secondary | ICD-10-CM

## 2016-09-21 DIAGNOSIS — I6529 Occlusion and stenosis of unspecified carotid artery: Secondary | ICD-10-CM

## 2016-09-21 DIAGNOSIS — Z87891 Personal history of nicotine dependence: Secondary | ICD-10-CM | POA: Diagnosis not present

## 2016-09-21 MED ORDER — METHYLPREDNISOLONE 4 MG PO TBPK: ORAL_TABLET | ORAL | 0 refills | Status: DC

## 2016-09-21 NOTE — Patient Instructions (Signed)

## 2016-09-21 NOTE — Progress Notes (Signed)
[facility] [place]  CC: back pain s/p Endovascular Repair of Abdominal Aortic Aneurysm    History of Present Illness  Justin [name] is a 83 y.o. (02-25-1923) male who is status post endovascular repair of a 5.5 cm infrarenal abdominal aortic aneurysm on 06/03/2016.  His postoperative course was uncomplicated.  Dr. Trula Slade last evaluated pt on 07-20-16. At that time his pedal pulses were palpable bilaterally.  No significant edema. At that time the patient complained of some low back pain as well as some numbness in his legs which did not bother him that much.      His CT scan showed that the stent graft is in good position without evidence of endoleak. The patient was doing very well that time.  He had no concerns regarding his aneurysm.  Dr. Trula Slade suspected his lower back pain, given his prior back surgeries, was secondary to positioning on the operating room table, and that this would resolve with time.  Dr. Trula Slade contemplated a steroid Dosepak that day, however he decided to wait another few months before doing that. Dr. Trula Slade scheduled pt to follow-up in 6 months with an ultrasound.  He was to contact Dr. Trula Slade sooner if he is not back to his baseline by then.  Pt returns today with c/o back pain when he is standing or sitting, not when lying down. He states that it is lower back.  He explains that he is not one to complain of pain, however this has stayed the same, has not improved. In the last 2 weeks he has had numbness radiating from left groin to the medial aspect of his left foot. The numbness in his foot does not seem to resolve with being supine.   He had several lumbar spine surgeries and rarely had back pain. The patient denies symptoms referable to claudication in legs with walking. He does have left ankle tight feeling most of the time and left foot numbness at previous visits.  The patient denies history of stroke or TIA symptoms.  He takes a daily  81 mg ASA, daily beta blocker, does not take a statin, states he does not have cholesterol problem.   Pt Diabetic: No Pt smoker: former smoker, quit in 1980     Past Medical History:  Diagnosis Date  . AAA (abdominal aortic aneurysm) ([place])    "we've known about it since ~ 2000"  . AAA (abdominal aortic aneurysm) ([place]) 06/03/2016  . Arthritis    in back and hands:  Gout  . Atrial fibrillation ([place])    a. 04/2011 post op  . BPH (benign prostatic hyperplasia)   . Bronchitis    hx of; "once or twice in my life" (04/21/11)  . Cancer ([place])    Basal Cell carcinoma- per Dr Jacquiline Doe office notes  . Chronic kidney disease    stage 3 - per Dr Jacquiline Doe office notes  . Chronic lower back pain 1995  . Eczema   . GERD (gastroesophageal reflux disease)   . Gout of big toe    "h/o in both"  . Hiatal hernia   . History of kidney stones   . Hypertension    Takes lisinopril/hctz  . Lumbar spondylosis   . Lumbar stenosis    a. s/p  anterolateral decompression and lateral plating 04/21/2011  . Migraine    "left ~ 1980's"  . Stomach ulcer    "years ago; treated w/diet"    Past Surgical History:  Procedure Laterality Date  .  ABDOMINAL AORTA STENT  06/03/2016   ABDOMINAL AORTIC ENDOVASCULAR STENT GRAFT insertion (N/A)  . ABDOMINAL AORTIC ENDOVASCULAR STENT GRAFT N/A 06/03/2016   Procedure: ABDOMINAL AORTIC ENDOVASCULAR STENT GRAFT insertion;  Surgeon: Serafina Mitchell, MD;  Location: [facility];  Service: Vascular;  Laterality: N/A;  . ANTERIOR LAT LUMBAR FUSION  04/21/11   w/PEEK cage, allograft, and lateral plate (r)  . ANTERIOR LAT LUMBAR FUSION  04/21/2011   Procedure: ANTERIOR LATERAL LUMBAR FUSION 1 LEVEL;  Surgeon: Erline Levine, MD;  Location: [facility];  Service: Neurosurgery;  Laterality: Right;  RIGHT Lumbar three-four anterolateral decompression with fusion and lateral plating  . APPENDECTOMY  1946  . BACK SURGERY  04/21/11   "today makes the 5th time"  . CATARACT EXTRACTION, BILATERAL     . EYE SURGERY Bilateral    Cataract  . PROSTATE SURGERY    . SPINE SURGERY     X's 5   . TONSILLECTOMY     "as a child" and Adneoids   Social History Social History  Substance Use Topics  . Smoking status: Former Smoker    Packs/day: 1.00    Years: 20.00    Types: Cigarettes    Quit date: 02/10/1980  . Smokeless tobacco: Never Used  . Alcohol use No   Family History Family History  Problem Relation Age of Onset  . Alzheimer's disease Mother        died @ 48  . COPD Father        died @ 16  . Cancer Father   . Hypertension Father   . Heart disease Father   . Stroke Sister   . Cancer Brother        stomach  . Heart disease Brother        Aneurysm  . Hypertension Son   . Heart disease Son        Aneurysm  . Hypertension Son   . Heart disease Son        Aneurysm  . Anesthesia problems Neg Hx   . Hypotension Neg Hx   . Malignant hyperthermia Neg Hx   . Pseudochol deficiency Neg Hx    Current Outpatient Prescriptions on File Prior to Visit  Medication Sig Dispense Refill  . aspirin EC 81 MG tablet Take 81 mg by mouth daily.     . bisacodyl (DULCOLAX) 5 MG EC tablet Take 1 tablet (5 mg total) by mouth daily as needed for moderate constipation. 30 tablet 0  . brimonidine (ALPHAGAN) 0.2 % ophthalmic solution Place 1 drop into both eyes 2 (two) times daily.     . dorzolamide-timolol (COSOPT) 22.3-6.8 MG/ML ophthalmic solution Place 1 drop into both eyes 2 (two) times daily.     Marland Kitchen dutasteride (AVODART) 0.5 MG capsule Take 0.5 mg by mouth 2 (two) times a week. Monday and Fridays    . lisinopril-hydrochlorothiazide (PRINZIDE,ZESTORETIC) 20-12.5 MG per tablet Take 1 tablet by mouth daily.    Marland Kitchen LUMIGAN 0.01 % SOLN Place 1 drop into both eyes 2 (two) times a week. Mondays and Fridays  3  . Naproxen Sodium (ALEVE) 220 MG CAPS Take by mouth.    . triamcinolone cream (KENALOG) 0.1 % Apply 1 application topically daily as needed (rash).      No current facility-administered  medications on file prior to visit.    Allergies  Allergen Reactions  . Demerol Anaphylaxis  . Meperidine Anaphylaxis     ROS: See HPI for pertinent positives and negatives.  Physical Examination  Vitals:   09/21/16 1303 09/21/16 1308  BP: (!) 196/87 (!) 191/101  Pulse: 81   Resp: 20   Temp: (!) 97.2 F (36.2 C)   TempSrc: Oral   SpO2: 97%   Weight: 153 lb 11.2 oz (69.7 kg)   Height: 5\' 10"  (1.778 m)    Body mass index is 22.05 kg/m.  General: A&O x 3, WD male.  Pulmonary: Sym exp, respirations are non labored, good air movement in all fields, CTAB, no rales, rhonchi, or wheezing.   Cardiac: RRR, Nl S1, S2, no murmur detected.  Carotid Bruits Left Right   Negative Negative   Abdominal aortic pulse is not palpable Radial pulses are are 2+ and =   VASCULAR EXAM:     LE Pulses LEFT RIGHT   FEMORAL palpable palpable   POPLITEAL not palpable  not palpable   POSTERIOR TIBIAL  palpable   palpable    DORSALIS PEDIS  ANTERIOR TIBIAL not palpable  not palpable     Gastrointestinal: soft, NTND, -G/R, - HSM, reducible moderate sized asymptomatic ventral hernia when lying down or sitting up, - CVAT B.  Musculoskeletal: M/S 5/5 throughout, Extremities without ischemic changes.   Neurologic: CN 2-12 intact except is hard of hearing, mostly corrected with bilateral hearing aids, Pain and light touch intact in extremities, Motor exam as listed above.    Medical Decision Making  Justin [name] is a 83 y.o. male who presents s/p EVAR (Date: 06/03/2016).     He returns today with lingering bilateral low back pain since the EVAR, that is worse with sitting or standing, no pain when supine.  In the last 2 weeks he has  developed numbness from his left groin to medial aspect of left foot.  At previous visits he has c/o left ankle tight feeling most of the time and left foot numbness.  I discussed the above with Dr. Trula Slade who does not think pt's low back pain related to the EVAR, but possibly due to pt's underlying lumbar spine issues  aggravated by his position on the table during surgery.    Medrol dose pack prescribed, refer to Dr. Vertell Limber, his back surgeon, for evaluation.   Return as already scheduled in December 2018 with EVAR duplex.   I emphasized the importance of maximal medical management including strict control of blood pressure, blood glucose, and lipid levels, antiplatelet agents, obtaining regular exercise, and cessation of smoking.   Thank you for allowing [place] to participate in this patient's care.  Clemon Chambers, RN, MSN, FNP-C [facility] [place] Office: [facility] Physician: Trula Slade  09/21/2016, 1:16 PM

## 2016-10-05 DIAGNOSIS — I129 Hypertensive chronic kidney disease with stage 1 through stage 4 chronic kidney disease, or unspecified chronic kidney disease: Secondary | ICD-10-CM | POA: Diagnosis not present

## 2016-10-05 DIAGNOSIS — Z6823 Body mass index (BMI) 23.0-23.9, adult: Secondary | ICD-10-CM | POA: Diagnosis not present

## 2016-10-07 DIAGNOSIS — H01024 Squamous blepharitis left upper eyelid: Secondary | ICD-10-CM | POA: Diagnosis not present

## 2016-10-07 DIAGNOSIS — H401131 Primary open-angle glaucoma, bilateral, mild stage: Secondary | ICD-10-CM | POA: Diagnosis not present

## 2016-10-07 DIAGNOSIS — H01021 Squamous blepharitis right upper eyelid: Secondary | ICD-10-CM | POA: Diagnosis not present

## 2016-11-07 DIAGNOSIS — Z23 Encounter for immunization: Secondary | ICD-10-CM | POA: Diagnosis not present

## 2016-11-18 DIAGNOSIS — H401131 Primary open-angle glaucoma, bilateral, mild stage: Secondary | ICD-10-CM | POA: Diagnosis not present

## 2016-12-02 DIAGNOSIS — M4000 Postural kyphosis, site unspecified: Secondary | ICD-10-CM | POA: Diagnosis not present

## 2016-12-02 DIAGNOSIS — M5416 Radiculopathy, lumbar region: Secondary | ICD-10-CM | POA: Diagnosis not present

## 2016-12-02 DIAGNOSIS — I10 Essential (primary) hypertension: Secondary | ICD-10-CM | POA: Diagnosis not present

## 2016-12-02 DIAGNOSIS — M545 Low back pain: Secondary | ICD-10-CM | POA: Diagnosis not present

## 2016-12-15 DIAGNOSIS — I10 Essential (primary) hypertension: Secondary | ICD-10-CM | POA: Diagnosis not present

## 2016-12-15 DIAGNOSIS — E7849 Other hyperlipidemia: Secondary | ICD-10-CM | POA: Diagnosis not present

## 2016-12-15 DIAGNOSIS — R82998 Other abnormal findings in urine: Secondary | ICD-10-CM | POA: Diagnosis not present

## 2016-12-15 DIAGNOSIS — E041 Nontoxic single thyroid nodule: Secondary | ICD-10-CM | POA: Diagnosis not present

## 2016-12-15 DIAGNOSIS — M109 Gout, unspecified: Secondary | ICD-10-CM | POA: Diagnosis not present

## 2016-12-15 DIAGNOSIS — R7302 Impaired glucose tolerance (oral): Secondary | ICD-10-CM | POA: Diagnosis not present

## 2016-12-15 DIAGNOSIS — Z125 Encounter for screening for malignant neoplasm of prostate: Secondary | ICD-10-CM | POA: Diagnosis not present

## 2016-12-21 DIAGNOSIS — I714 Abdominal aortic aneurysm, without rupture: Secondary | ICD-10-CM | POA: Diagnosis not present

## 2016-12-21 DIAGNOSIS — Z0000 Encounter for general adult medical examination without abnormal findings: Secondary | ICD-10-CM | POA: Diagnosis not present

## 2016-12-21 DIAGNOSIS — I129 Hypertensive chronic kidney disease with stage 1 through stage 4 chronic kidney disease, or unspecified chronic kidney disease: Secondary | ICD-10-CM | POA: Diagnosis not present

## 2016-12-21 DIAGNOSIS — M109 Gout, unspecified: Secondary | ICD-10-CM | POA: Diagnosis not present

## 2016-12-21 DIAGNOSIS — N401 Enlarged prostate with lower urinary tract symptoms: Secondary | ICD-10-CM | POA: Diagnosis not present

## 2016-12-21 DIAGNOSIS — D692 Other nonthrombocytopenic purpura: Secondary | ICD-10-CM | POA: Diagnosis not present

## 2016-12-21 DIAGNOSIS — I739 Peripheral vascular disease, unspecified: Secondary | ICD-10-CM | POA: Diagnosis not present

## 2016-12-21 DIAGNOSIS — Z6823 Body mass index (BMI) 23.0-23.9, adult: Secondary | ICD-10-CM | POA: Diagnosis not present

## 2016-12-21 DIAGNOSIS — I10 Essential (primary) hypertension: Secondary | ICD-10-CM | POA: Diagnosis not present

## 2016-12-21 DIAGNOSIS — N183 Chronic kidney disease, stage 3 (moderate): Secondary | ICD-10-CM | POA: Diagnosis not present

## 2016-12-21 DIAGNOSIS — E7849 Other hyperlipidemia: Secondary | ICD-10-CM | POA: Diagnosis not present

## 2016-12-21 DIAGNOSIS — M19049 Primary osteoarthritis, unspecified hand: Secondary | ICD-10-CM | POA: Diagnosis not present

## 2016-12-25 DIAGNOSIS — M5416 Radiculopathy, lumbar region: Secondary | ICD-10-CM | POA: Diagnosis not present

## 2016-12-28 DIAGNOSIS — H401131 Primary open-angle glaucoma, bilateral, mild stage: Secondary | ICD-10-CM | POA: Diagnosis not present

## 2016-12-29 DIAGNOSIS — M4000 Postural kyphosis, site unspecified: Secondary | ICD-10-CM | POA: Diagnosis not present

## 2016-12-29 DIAGNOSIS — M48061 Spinal stenosis, lumbar region without neurogenic claudication: Secondary | ICD-10-CM | POA: Diagnosis not present

## 2016-12-30 DIAGNOSIS — I10 Essential (primary) hypertension: Secondary | ICD-10-CM | POA: Diagnosis not present

## 2016-12-30 DIAGNOSIS — M5416 Radiculopathy, lumbar region: Secondary | ICD-10-CM | POA: Diagnosis not present

## 2016-12-30 DIAGNOSIS — M545 Low back pain: Secondary | ICD-10-CM | POA: Diagnosis not present

## 2016-12-30 DIAGNOSIS — M4000 Postural kyphosis, site unspecified: Secondary | ICD-10-CM | POA: Diagnosis not present

## 2016-12-30 DIAGNOSIS — M48061 Spinal stenosis, lumbar region without neurogenic claudication: Secondary | ICD-10-CM | POA: Diagnosis not present

## 2017-01-08 DIAGNOSIS — Z6823 Body mass index (BMI) 23.0-23.9, adult: Secondary | ICD-10-CM | POA: Diagnosis not present

## 2017-01-08 DIAGNOSIS — I10 Essential (primary) hypertension: Secondary | ICD-10-CM | POA: Diagnosis not present

## 2017-01-08 DIAGNOSIS — M1A09X0 Idiopathic chronic gout, multiple sites, without tophus (tophi): Secondary | ICD-10-CM | POA: Diagnosis not present

## 2017-01-12 DIAGNOSIS — M48061 Spinal stenosis, lumbar region without neurogenic claudication: Secondary | ICD-10-CM | POA: Diagnosis not present

## 2017-01-18 ENCOUNTER — Other Ambulatory Visit (HOSPITAL_COMMUNITY): Payer: Medicare Other

## 2017-01-18 ENCOUNTER — Ambulatory Visit: Payer: Medicare Other | Admitting: Surgery

## 2017-02-08 DIAGNOSIS — H401131 Primary open-angle glaucoma, bilateral, mild stage: Secondary | ICD-10-CM | POA: Diagnosis not present

## 2017-02-15 DIAGNOSIS — R7302 Impaired glucose tolerance (oral): Secondary | ICD-10-CM | POA: Diagnosis not present

## 2017-02-15 DIAGNOSIS — I10 Essential (primary) hypertension: Secondary | ICD-10-CM | POA: Diagnosis not present

## 2017-02-15 DIAGNOSIS — I739 Peripheral vascular disease, unspecified: Secondary | ICD-10-CM | POA: Diagnosis not present

## 2017-02-15 DIAGNOSIS — N183 Chronic kidney disease, stage 3 (moderate): Secondary | ICD-10-CM | POA: Diagnosis not present

## 2017-02-15 DIAGNOSIS — I129 Hypertensive chronic kidney disease with stage 1 through stage 4 chronic kidney disease, or unspecified chronic kidney disease: Secondary | ICD-10-CM | POA: Diagnosis not present

## 2017-02-15 DIAGNOSIS — M109 Gout, unspecified: Secondary | ICD-10-CM | POA: Diagnosis not present

## 2017-02-15 DIAGNOSIS — I714 Abdominal aortic aneurysm, without rupture: Secondary | ICD-10-CM | POA: Diagnosis not present

## 2017-02-15 DIAGNOSIS — R8290 Unspecified abnormal findings in urine: Secondary | ICD-10-CM | POA: Diagnosis not present

## 2017-02-15 DIAGNOSIS — E7849 Other hyperlipidemia: Secondary | ICD-10-CM | POA: Diagnosis not present

## 2017-02-15 DIAGNOSIS — M19049 Primary osteoarthritis, unspecified hand: Secondary | ICD-10-CM | POA: Diagnosis not present

## 2017-02-15 DIAGNOSIS — N419 Inflammatory disease of prostate, unspecified: Secondary | ICD-10-CM | POA: Diagnosis not present

## 2017-02-15 DIAGNOSIS — N401 Enlarged prostate with lower urinary tract symptoms: Secondary | ICD-10-CM | POA: Diagnosis not present

## 2017-02-15 DIAGNOSIS — N390 Urinary tract infection, site not specified: Secondary | ICD-10-CM | POA: Diagnosis not present

## 2017-02-15 DIAGNOSIS — D692 Other nonthrombocytopenic purpura: Secondary | ICD-10-CM | POA: Diagnosis not present

## 2017-02-17 DIAGNOSIS — M4000 Postural kyphosis, site unspecified: Secondary | ICD-10-CM | POA: Diagnosis not present

## 2017-02-17 DIAGNOSIS — I10 Essential (primary) hypertension: Secondary | ICD-10-CM | POA: Diagnosis not present

## 2017-02-17 DIAGNOSIS — M48061 Spinal stenosis, lumbar region without neurogenic claudication: Secondary | ICD-10-CM | POA: Diagnosis not present

## 2017-02-17 DIAGNOSIS — M5416 Radiculopathy, lumbar region: Secondary | ICD-10-CM | POA: Diagnosis not present

## 2017-02-17 DIAGNOSIS — M545 Low back pain: Secondary | ICD-10-CM | POA: Diagnosis not present

## 2017-02-17 DIAGNOSIS — M47818 Spondylosis without myelopathy or radiculopathy, sacral and sacrococcygeal region: Secondary | ICD-10-CM | POA: Diagnosis not present

## 2017-02-25 DIAGNOSIS — R7989 Other specified abnormal findings of blood chemistry: Secondary | ICD-10-CM | POA: Diagnosis not present

## 2017-02-25 DIAGNOSIS — N183 Chronic kidney disease, stage 3 (moderate): Secondary | ICD-10-CM | POA: Diagnosis not present

## 2017-02-25 DIAGNOSIS — I129 Hypertensive chronic kidney disease with stage 1 through stage 4 chronic kidney disease, or unspecified chronic kidney disease: Secondary | ICD-10-CM | POA: Diagnosis not present

## 2017-02-25 DIAGNOSIS — N418 Other inflammatory diseases of prostate: Secondary | ICD-10-CM | POA: Diagnosis not present

## 2017-02-25 DIAGNOSIS — Z6822 Body mass index (BMI) 22.0-22.9, adult: Secondary | ICD-10-CM | POA: Diagnosis not present

## 2017-02-25 DIAGNOSIS — Z79899 Other long term (current) drug therapy: Secondary | ICD-10-CM | POA: Diagnosis not present

## 2017-02-25 DIAGNOSIS — R5383 Other fatigue: Secondary | ICD-10-CM | POA: Diagnosis not present

## 2017-03-04 DIAGNOSIS — N401 Enlarged prostate with lower urinary tract symptoms: Secondary | ICD-10-CM | POA: Diagnosis not present

## 2017-03-04 DIAGNOSIS — R3914 Feeling of incomplete bladder emptying: Secondary | ICD-10-CM | POA: Diagnosis not present

## 2017-03-04 DIAGNOSIS — R3911 Hesitancy of micturition: Secondary | ICD-10-CM | POA: Diagnosis not present

## 2017-03-04 DIAGNOSIS — R351 Nocturia: Secondary | ICD-10-CM | POA: Diagnosis not present

## 2017-03-04 DIAGNOSIS — R3912 Poor urinary stream: Secondary | ICD-10-CM | POA: Diagnosis not present

## 2017-03-08 DIAGNOSIS — N400 Enlarged prostate without lower urinary tract symptoms: Secondary | ICD-10-CM | POA: Diagnosis not present

## 2017-03-08 DIAGNOSIS — Z6822 Body mass index (BMI) 22.0-22.9, adult: Secondary | ICD-10-CM | POA: Diagnosis not present

## 2017-03-08 DIAGNOSIS — N183 Chronic kidney disease, stage 3 (moderate): Secondary | ICD-10-CM | POA: Diagnosis not present

## 2017-03-08 DIAGNOSIS — I10 Essential (primary) hypertension: Secondary | ICD-10-CM | POA: Diagnosis not present

## 2017-03-08 DIAGNOSIS — M545 Low back pain: Secondary | ICD-10-CM | POA: Diagnosis not present

## 2017-03-16 DIAGNOSIS — M47818 Spondylosis without myelopathy or radiculopathy, sacral and sacrococcygeal region: Secondary | ICD-10-CM | POA: Diagnosis not present

## 2017-03-22 DIAGNOSIS — H401131 Primary open-angle glaucoma, bilateral, mild stage: Secondary | ICD-10-CM | POA: Diagnosis not present

## 2017-03-29 ENCOUNTER — Encounter: Payer: Self-pay | Admitting: Surgery

## 2017-03-29 ENCOUNTER — Ambulatory Visit (HOSPITAL_COMMUNITY)
Admission: RE | Admit: 2017-03-29 | Discharge: 2017-03-29 | Disposition: A | Discharge status: Home / Self Care | Payer: Medicare Other | Source: Ambulatory Visit | Attending: Surgery | Admitting: Surgery

## 2017-03-29 ENCOUNTER — Other Ambulatory Visit: Payer: Self-pay

## 2017-03-29 ENCOUNTER — Ambulatory Visit (INDEPENDENT_AMBULATORY_CARE_PROVIDER_SITE_OTHER): Payer: Medicare Other | Admitting: Surgery

## 2017-03-29 VITALS — BP 180/99 | HR 79 | Temp 98.1°F | Resp 18 | Ht 70.5 in | Wt 148.0 lb

## 2017-03-29 DIAGNOSIS — I6523 Occlusion and stenosis of bilateral carotid arteries: Secondary | ICD-10-CM | POA: Diagnosis not present

## 2017-03-29 DIAGNOSIS — I714 Abdominal aortic aneurysm, without rupture, unspecified: Secondary | ICD-10-CM

## 2017-03-29 NOTE — Progress Notes (Signed)
[facility]  Patient name: Justin [name] MRN: [id number] DOB: 21-Dec-1923 Sex: male   REASON FOR VISIT:    Follow up  HISOTRY OF PRESENT ILLNESS:    Justin [name] is a 84 y.o. male who is status post endovascular repair of a 5.5 cm infrarenal abdominal aortic aneurysm on 06/03/2016.  His postoperative course was uncomplicated.  He does not have any complaints today.  He has had some changes in his blood pressure medication which he did not take today.  Family is concerned about some changes in his memory.  He does not have any focal deficits   PAST MEDICAL HISTORY:   Past Medical History:  Diagnosis Date  . AAA (abdominal aortic aneurysm) ([place])    "we've known about it since ~ 2000"  . AAA (abdominal aortic aneurysm) ([place]) 06/03/2016  . Arthritis    in back and hands:  Gout  . Atrial fibrillation ([place])    a. 04/2011 post op  . BPH (benign prostatic hyperplasia)   . Bronchitis    hx of; "once or twice in my life" (04/21/11)  . Cancer ([place])    Basal Cell carcinoma- per Dr Jacquiline Doe office notes  . Chronic kidney disease    stage 3 - per Dr Jacquiline Doe office notes  . Chronic lower back pain 1995  . Eczema   . GERD (gastroesophageal reflux disease)   . Gout of big toe    "h/o in both"  . Hiatal hernia   . History of kidney stones   . Hypertension    Takes lisinopril/hctz  . Lumbar spondylosis   . Lumbar stenosis    a. s/p  anterolateral decompression and lateral plating 04/21/2011  . Migraine    "left ~ 1980's"  . Stomach ulcer    "years ago; treated w/diet"      FAMILY HISTORY:   Family History  Problem Relation Age of Onset  . Alzheimer's disease Mother        died @ 37  . COPD Father        died @ 66  . Cancer Father   . Hypertension Father   . Heart disease Father   . Stroke Sister   . Cancer Brother        stomach  . Heart disease Brother        Aneurysm  . Hypertension Son   .  Heart disease Son        Aneurysm  . Hypertension Son   . Heart disease Son        Aneurysm  . Anesthesia problems Neg Hx   . Hypotension Neg Hx   . Malignant hyperthermia Neg Hx   . Pseudochol deficiency Neg Hx     SOCIAL HISTORY:   Social History   Tobacco Use  . Smoking status: Former Smoker    Packs/day: 1.00    Years: 20.00    Pack years: 20.00    Types: Cigarettes    Last attempt to quit: 02/10/1980    Years since quitting: 37.1  . Smokeless tobacco: Never Used  Substance Use Topics  . Alcohol use: No    Alcohol/week: 0.0 oz     ALLERGIES:   Allergies  Allergen Reactions  . Demerol Anaphylaxis  . Meperidine Anaphylaxis     CURRENT MEDICATIONS:   Current Outpatient Medications  Medication Sig Dispense Refill  . aspirin EC 81 MG tablet Take 81 mg by mouth daily.     Marland Kitchen  bisacodyl (DULCOLAX) 5 MG EC tablet Take 1 tablet (5 mg total) by mouth daily as needed for moderate constipation. 30 tablet 0  . brimonidine (ALPHAGAN) 0.2 % ophthalmic solution Place 1 drop into both eyes 2 (two) times daily.     . dorzolamide-timolol (COSOPT) 22.3-6.8 MG/ML ophthalmic solution Place 1 drop into both eyes 2 (two) times daily.     Marland Kitchen dutasteride (AVODART) 0.5 MG capsule Take 0.5 mg by mouth 2 (two) times a week. Monday and Fridays    . lisinopril-hydrochlorothiazide (PRINZIDE,ZESTORETIC) 20-12.5 MG per tablet Take 1 tablet by mouth daily.    . methylPREDNISolone (MEDROL DOSEPAK) 4 MG TBPK tablet Take as directed on pack 21 tablet 0  . Naproxen Sodium (ALEVE) 220 MG CAPS Take by mouth.    . triamcinolone cream (KENALOG) 0.1 % Apply 1 application topically daily as needed (rash).     Marland Kitchen LUMIGAN 0.01 % SOLN Place 1 drop into both eyes 2 (two) times a week. Mondays and Fridays  3   No current facility-administered medications for this visit.     REVIEW OF SYSTEMS:   [X]  denotes positive finding, [ ]  denotes negative finding Cardiac  Comments:  Chest pain or chest pressure:     Shortness of breath upon exertion:    Short of breath when lying flat:    Irregular heart rhythm:        Vascular    Pain in calf, thigh, or hip brought on by ambulation:    Pain in feet at night that wakes you up from your sleep:     Blood clot in your veins:    Leg swelling:         Pulmonary    Oxygen at home:    Productive cough:     Wheezing:         Neurologic    Sudden weakness in arms or legs:     Sudden numbness in arms or legs:     Sudden onset of difficulty speaking or slurred speech:    Temporary loss of vision in one eye:     Problems with dizziness:         Gastrointestinal    Blood in stool:     Vomited blood:         Genitourinary    Burning when urinating:     Blood in urine:        Psychiatric    Major depression:         Hematologic    Bleeding problems:    Problems with blood clotting too easily:        Skin    Rashes or ulcers:        Constitutional    Fever or chills:      PHYSICAL EXAM:   Vitals:   03/29/17 0857 03/29/17 0902  BP: (!) 183/99 (!) 180/99  Pulse: 79 79  Resp: 18   Temp: 98.1 F (36.7 C)   SpO2: 98%   Weight: 148 lb (67.1 kg)   Height: 5' 10.5" (1.791 m)     GENERAL: The patient is a well-nourished male, in no acute distress. The vital signs are documented above. CARDIAC: There is a regular rate and rhythm.  VASCULAR: Palpable pedal pulses.  No carotid bruit. PULMONARY: Non-labored respirations ABDOMEN: Soft and non-tender.  No pulsatile mass MUSCULOSKELETAL: There are no major deformities or cyanosis. NEUROLOGIC: No focal weakness or paresthesias are detected. SKIN: There are no ulcers or rashes noted. PSYCHIATRIC:  The patient has a normal affect.  STUDIES:   I have ordered and reviewed his vascular ultrasound.  Aortic diameter is 5.4 cm.  No evidence of endoleak  MEDICAL ISSUES:   AAA: The patient will get a follow-up ultrasound in 6 months.  If this shows no significant change, we can follow this on an  annual basis  Carotid stenosis: The patient remains asymptomatic.  He is scheduled for carotid duplex in 6 months.    [name], MD [facility] [phone] Pager [phone]

## 2017-03-30 ENCOUNTER — Other Ambulatory Visit: Payer: Self-pay

## 2017-03-30 DIAGNOSIS — I714 Abdominal aortic aneurysm, without rupture, unspecified: Secondary | ICD-10-CM

## 2017-03-30 DIAGNOSIS — I6529 Occlusion and stenosis of unspecified carotid artery: Secondary | ICD-10-CM

## 2017-04-12 DIAGNOSIS — H401131 Primary open-angle glaucoma, bilateral, mild stage: Secondary | ICD-10-CM | POA: Diagnosis not present

## 2017-04-14 DIAGNOSIS — M545 Low back pain: Secondary | ICD-10-CM | POA: Diagnosis not present

## 2017-04-14 DIAGNOSIS — I10 Essential (primary) hypertension: Secondary | ICD-10-CM | POA: Diagnosis not present

## 2017-04-14 DIAGNOSIS — M47818 Spondylosis without myelopathy or radiculopathy, sacral and sacrococcygeal region: Secondary | ICD-10-CM | POA: Diagnosis not present

## 2017-05-17 DIAGNOSIS — L89309 Pressure ulcer of unspecified buttock, unspecified stage: Secondary | ICD-10-CM | POA: Diagnosis not present

## 2017-05-17 DIAGNOSIS — I10 Essential (primary) hypertension: Secondary | ICD-10-CM | POA: Diagnosis not present

## 2017-05-17 DIAGNOSIS — Z6823 Body mass index (BMI) 23.0-23.9, adult: Secondary | ICD-10-CM | POA: Diagnosis not present

## 2017-05-26 DIAGNOSIS — L89309 Pressure ulcer of unspecified buttock, unspecified stage: Secondary | ICD-10-CM | POA: Diagnosis not present

## 2017-05-26 DIAGNOSIS — I10 Essential (primary) hypertension: Secondary | ICD-10-CM | POA: Diagnosis not present

## 2017-05-26 DIAGNOSIS — M545 Low back pain: Secondary | ICD-10-CM | POA: Diagnosis not present

## 2017-05-26 DIAGNOSIS — Z6823 Body mass index (BMI) 23.0-23.9, adult: Secondary | ICD-10-CM | POA: Diagnosis not present

## 2017-06-07 DIAGNOSIS — R140 Abdominal distension (gaseous): Secondary | ICD-10-CM | POA: Diagnosis not present

## 2017-06-07 DIAGNOSIS — M545 Low back pain: Secondary | ICD-10-CM | POA: Diagnosis not present

## 2017-06-07 DIAGNOSIS — Z6823 Body mass index (BMI) 23.0-23.9, adult: Secondary | ICD-10-CM | POA: Diagnosis not present

## 2017-06-10 DIAGNOSIS — R351 Nocturia: Secondary | ICD-10-CM | POA: Diagnosis not present

## 2017-06-10 DIAGNOSIS — N401 Enlarged prostate with lower urinary tract symptoms: Secondary | ICD-10-CM | POA: Diagnosis not present

## 2017-06-14 DIAGNOSIS — M47818 Spondylosis without myelopathy or radiculopathy, sacral and sacrococcygeal region: Secondary | ICD-10-CM | POA: Diagnosis not present

## 2017-06-14 DIAGNOSIS — H401131 Primary open-angle glaucoma, bilateral, mild stage: Secondary | ICD-10-CM | POA: Diagnosis not present

## 2017-06-23 DIAGNOSIS — N183 Chronic kidney disease, stage 3 (moderate): Secondary | ICD-10-CM | POA: Diagnosis not present

## 2017-06-23 DIAGNOSIS — R5383 Other fatigue: Secondary | ICD-10-CM | POA: Diagnosis not present

## 2017-06-23 DIAGNOSIS — R7302 Impaired glucose tolerance (oral): Secondary | ICD-10-CM | POA: Diagnosis not present

## 2017-06-23 DIAGNOSIS — N400 Enlarged prostate without lower urinary tract symptoms: Secondary | ICD-10-CM | POA: Diagnosis not present

## 2017-06-23 DIAGNOSIS — Z1389 Encounter for screening for other disorder: Secondary | ICD-10-CM | POA: Diagnosis not present

## 2017-06-23 DIAGNOSIS — M109 Gout, unspecified: Secondary | ICD-10-CM | POA: Diagnosis not present

## 2017-06-23 DIAGNOSIS — N419 Inflammatory disease of prostate, unspecified: Secondary | ICD-10-CM | POA: Diagnosis not present

## 2017-06-23 DIAGNOSIS — I129 Hypertensive chronic kidney disease with stage 1 through stage 4 chronic kidney disease, or unspecified chronic kidney disease: Secondary | ICD-10-CM | POA: Diagnosis not present

## 2017-06-23 DIAGNOSIS — M2550 Pain in unspecified joint: Secondary | ICD-10-CM | POA: Diagnosis not present

## 2017-06-23 DIAGNOSIS — I739 Peripheral vascular disease, unspecified: Secondary | ICD-10-CM | POA: Diagnosis not present

## 2017-06-23 DIAGNOSIS — M19049 Primary osteoarthritis, unspecified hand: Secondary | ICD-10-CM | POA: Diagnosis not present

## 2017-06-23 DIAGNOSIS — D692 Other nonthrombocytopenic purpura: Secondary | ICD-10-CM | POA: Diagnosis not present

## 2017-07-09 ENCOUNTER — Telehealth: Payer: Self-pay | Admitting: *Deleted

## 2017-07-09 NOTE — Telephone Encounter (Signed)
Wife called and stated patient has been having back pain for about 9-10 months. At time pain is severe and has trouble walking. She states they have  not seen anyone about this. Instructed to get in touch with PCP provider today and let him know about this pain . Or go to the ER for work up and evaluation of condition.

## 2017-07-19 DIAGNOSIS — M47818 Spondylosis without myelopathy or radiculopathy, sacral and sacrococcygeal region: Secondary | ICD-10-CM | POA: Diagnosis not present

## 2017-07-19 DIAGNOSIS — M545 Low back pain: Secondary | ICD-10-CM | POA: Diagnosis not present

## 2017-07-20 ENCOUNTER — Ambulatory Visit (INDEPENDENT_AMBULATORY_CARE_PROVIDER_SITE_OTHER)
Admission: RE | Admit: 2017-07-20 | Discharge: 2017-07-20 | Disposition: A | Discharge status: Home / Self Care | Payer: Medicare Other | Source: Ambulatory Visit | Attending: Family | Admitting: Family

## 2017-07-20 ENCOUNTER — Other Ambulatory Visit: Payer: Self-pay

## 2017-07-20 ENCOUNTER — Ambulatory Visit (HOSPITAL_COMMUNITY)
Admission: RE | Admit: 2017-07-20 | Discharge: 2017-07-20 | Disposition: A | Discharge status: Home / Self Care | Payer: Medicare Other | Source: Ambulatory Visit | Attending: Family | Admitting: Family

## 2017-07-20 ENCOUNTER — Encounter: Payer: Self-pay | Admitting: Family

## 2017-07-20 ENCOUNTER — Ambulatory Visit (INDEPENDENT_AMBULATORY_CARE_PROVIDER_SITE_OTHER): Payer: Medicare Other | Admitting: Family

## 2017-07-20 VITALS — BP 163/90 | HR 72 | Temp 97.7°F | Resp 16 | Ht 70.5 in | Wt 152.0 lb

## 2017-07-20 DIAGNOSIS — Z95828 Presence of other vascular implants and grafts: Secondary | ICD-10-CM | POA: Diagnosis not present

## 2017-07-20 DIAGNOSIS — I714 Abdominal aortic aneurysm, without rupture, unspecified: Secondary | ICD-10-CM

## 2017-07-20 DIAGNOSIS — I6523 Occlusion and stenosis of bilateral carotid arteries: Secondary | ICD-10-CM | POA: Insufficient documentation

## 2017-07-20 DIAGNOSIS — I6529 Occlusion and stenosis of unspecified carotid artery: Secondary | ICD-10-CM | POA: Diagnosis not present

## 2017-07-20 DIAGNOSIS — Z8679 Personal history of other diseases of the circulatory system: Secondary | ICD-10-CM

## 2017-07-20 DIAGNOSIS — Z87891 Personal history of nicotine dependence: Secondary | ICD-10-CM | POA: Insufficient documentation

## 2017-07-20 NOTE — Progress Notes (Signed)
[facility] [place]  CC: Follow up s/p Endovascular Repair of Abdominal Aortic Aneurysm and extracranial carotid artery stenosis   History of Present Illness  Justin [name] is a 84 y.o. (22-Aug-1923) male who is status post endovascular repair of a 5.5 cm infrarenal abdominal aortic aneurysm on 06/03/2016. His postoperative course was uncomplicated. We are also monitoring his extracranial carotid artery stenosis.   Dr. Trula Slade last evaluated pt on 03-29-17 At that time aortic diameter was 5.4 cm.  No evidence of endoleak Dr. Trula Slade advised follow-up ultrasound in 6 months.  If this shows no significant change, we can follow this on an annual basis Carotid stenosis: The patient remained asymptomatic. Follow up carotid duplex in 6 months.  Pt has seen Dr. Vertell Limber for his back pain, has had 2 ESI's, states the pain was helped temporarily, is due for another in July 2019.  Pt denies abdominal pain. He does have GERD.   Dr. Trula Slade evaluated pt on 07-20-16. At that time his pedal pulses were palpable bilaterally. No significant edema. At that time the patient complained of some low back pain as well as some numbness in his legs which did not bother him that much. His CT scan showed that the stent graft was in good position without evidence of endoleak.  He had several lumbar spine surgeries. The patient denies symptoms referable to claudication in legs with walking. He does have left ankle tight feeling most of the time and left foot numbness at previous visits.   The patient denies history of stroke or TIA symptoms.  He takes a daily 81 mg ASA, does not take a statin, states he does not have cholesterol problem.   Diabetic: No Tobaccos use:  former smoker, quit in 1980    Past Medical History:  Diagnosis Date  . AAA (abdominal aortic aneurysm) ([place])    "we've known about it since ~ 2000"  . AAA (abdominal aortic aneurysm) ([place]) 06/03/2016  . Arthritis    in  back and hands:  Gout  . Atrial fibrillation ([place])    a. 04/2011 post op  . BPH (benign prostatic hyperplasia)   . Bronchitis    hx of; "once or twice in my life" (04/21/11)  . Cancer ([place])    Basal Cell carcinoma- per Dr Jacquiline Doe office notes  . Chronic kidney disease    stage 3 - per Dr Jacquiline Doe office notes  . Chronic lower back pain 1995  . Eczema   . GERD (gastroesophageal reflux disease)   . Gout of big toe    "h/o in both"  . Hiatal hernia   . History of kidney stones   . Hypertension    Takes lisinopril/hctz  . Lumbar spondylosis   . Lumbar stenosis    a. s/p  anterolateral decompression and lateral plating 04/21/2011  . Migraine    "left ~ 1980's"  . Stomach ulcer    "years ago; treated w/diet"    Past Surgical History:  Procedure Laterality Date  . ABDOMINAL AORTA STENT  06/03/2016   ABDOMINAL AORTIC ENDOVASCULAR STENT GRAFT insertion (N/A)  . ABDOMINAL AORTIC ENDOVASCULAR STENT GRAFT N/A 06/03/2016   Procedure: ABDOMINAL AORTIC ENDOVASCULAR STENT GRAFT insertion;  Surgeon: Serafina Mitchell, MD;  Location: [facility];  Service: Vascular;  Laterality: N/A;  . ANTERIOR LAT LUMBAR FUSION  04/21/11   w/PEEK cage, allograft, and lateral plate (r)  . ANTERIOR LAT LUMBAR FUSION  04/21/2011   Procedure: ANTERIOR LATERAL LUMBAR FUSION 1 LEVEL;  Surgeon: Erline Levine, MD;  Location: [facility];  Service: Neurosurgery;  Laterality: Right;  RIGHT Lumbar three-four anterolateral decompression with fusion and lateral plating  . APPENDECTOMY  1946  . BACK SURGERY  04/21/11   "today makes the 5th time"  . CATARACT EXTRACTION, BILATERAL    . EYE SURGERY Bilateral    Cataract  . PROSTATE SURGERY    . SPINE SURGERY     X's 5   . TONSILLECTOMY     "as a child" and Adneoids   Social History Social History   Tobacco Use  . Smoking status: Former Smoker    Packs/day: 1.00    Years: 20.00    Pack years: 20.00    Types: Cigarettes    Last attempt to quit: 02/10/1980    Years since  quitting: 37.4  . Smokeless tobacco: Never Used  Substance Use Topics  . Alcohol use: No    Alcohol/week: 0.0 oz  . Drug use: No   Family History Family History  Problem Relation Age of Onset  . Alzheimer's disease Mother        died @ 69  . COPD Father        died @ 20  . Cancer Father   . Hypertension Father   . Heart disease Father   . Stroke Sister   . Cancer Brother        stomach  . Heart disease Brother        Aneurysm  . Hypertension Son   . Heart disease Son        Aneurysm  . Hypertension Son   . Heart disease Son        Aneurysm  . Anesthesia problems Neg Hx   . Hypotension Neg Hx   . Malignant hyperthermia Neg Hx   . Pseudochol deficiency Neg Hx    Current Outpatient Medications on File Prior to Visit  Medication Sig Dispense Refill  . aspirin EC 81 MG tablet Take 81 mg by mouth daily.     . brimonidine (ALPHAGAN) 0.2 % ophthalmic solution Place 1 drop into both eyes 2 (two) times daily.     . dorzolamide-timolol (COSOPT) 22.3-6.8 MG/ML ophthalmic solution Place 1 drop into both eyes 2 (two) times daily.     Marland Kitchen dutasteride (AVODART) 0.5 MG capsule Take 0.5 mg by mouth 2 (two) times a week. Monday and Fridays    . lisinopril-hydrochlorothiazide (PRINZIDE,ZESTORETIC) 20-12.5 MG per tablet Take 1 tablet by mouth daily.    Marland Kitchen LUMIGAN 0.01 % SOLN Place 1 drop into both eyes 2 (two) times a week. Mondays and Fridays  3  . Naproxen Sodium (ALEVE) 220 MG CAPS Take by mouth.    . bisacodyl (DULCOLAX) 5 MG EC tablet Take 1 tablet (5 mg total) by mouth daily as needed for moderate constipation. (Patient not taking: Reported on 07/20/2017) 30 tablet 0  . methylPREDNISolone (MEDROL DOSEPAK) 4 MG TBPK tablet Take as directed on pack (Patient not taking: Reported on 07/20/2017) 21 tablet 0  . triamcinolone cream (KENALOG) 0.1 % Apply 1 application topically daily as needed (rash).      No current facility-administered medications on file prior to visit.    Allergies   Allergen Reactions  . Demerol Anaphylaxis  . Meperidine Anaphylaxis     ROS: See HPI for pertinent positives and negatives.  Physical Examination  Vitals:   07/20/17 0933 07/20/17 0947 07/20/17 0948  BP: (!) 183/95 (!) 166/89 (!) 163/90  Pulse: 72 72  72  Resp: 16    Temp: 97.7 F (36.5 C)    TempSrc: Oral    SpO2: 100%    Weight: 152 lb (68.9 kg)    Height: 5' 10.5" (1.791 m)     Body mass index is 21.5 kg/m.  General: A&O x 3, WD, male HEENT: No gross abnormalities  Pulmonary: Sym exp, respirations are non labored, good air movement in all fields, CTAB, no rales, rhonchi, or wheezing. Cardiac: Regular rhythm and rate, no murmur appreciated  Vascular: Vessel Right Left  Radial 2+Palpable 2+Palpable  Brachial Palpable Palpable  Carotid  without bruit  without bruit  Aorta Not palpable N/A  Femoral Palpable Palpable  Popliteal Not palpable 1+palpable  PT Faintly Palpable Faintly Palpable  DP Not Palpable Not Palpable   Gastrointestinal: soft, NTND, -G/R, - HSM,  reducible moderate sized asymptomatic ventral hernia when lying down or sitting up,, - CVAT B. Musculoskeletal: M/S 5/5 throughout, extremities without ischemic changes. Skin: No rashes, no ulcers, no cellulitis.   Neurologic: Pain and light touch intact in extremities, Motor exam as above.  CN 2-12 intact except is hard of hearing, mostly corrected with bilateral hearing aids. Psychiatric: Normal thought content, mood appropriate for clinical situation.      DATA  Carotid Duplex (07-20-17): 60-79% bilateral ICA stenosis. Bilateral vertebral artery flow is antegrade.  Bilateral subclavian artery waveforms are normal.  Increased stenosis in the bilateral ICA compared to the exam on 05-26-16.   EVAR Duplex   Previous (Date: 03-29-17) AAA sac size: 5.4 cm; Right CIA: 1.4 cm; Left CIA: 1.7 cm  Current (Date: 07-20-17)  AAA sac size: 5.4 cm; Right CIA: 1.7 cm; Left CIA: 1.5 cm  no endoleak  detected  CTA Abd/Pelvis Duplex (Date: 05-19-16) Aorta: Infrarenal abdominal aortic aneurysm measuring up to 5.5 cm. Large amount of mural thrombus within this aneurysm particularly along the right posterior aspect. No aortic dissection. 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 The abdominal aortic aneurysm terminates above the aortic bifurcation. Celiac: Celiac trunk is patent without aneurysm or dissection. Mild stenosis of the origin related to calcified plaque. SMA: Patent without significant stenosis and no evidence for dissection. Small amount of calcified plaque at the origin. Renals: Single bilateral renal arteries with calcified plaque near the origins. No significant renal artery stenosis. Ectasia at the distal right renal artery measuring up to 0.7 cm. IMA: Patent without evidence of aneurysm, dissection, vasculitis or significant stenosis. Inflow: Peripheral calcifications involving the right iliac arteries without significant stenosis. Right common iliac artery is mildly tortuous. Calcified plaque in left common iliac artery with mild narrowing. Internal and external iliac arteries are patent bilaterally. Proximal Outflow: Atherosclerotic calcifications in the common femoral arteries bilaterally. No significant stenosis. Proximal femoral arteries are patent bilaterally. Veins: Main portal venous system is patent. IVC and renal veins are Patent.    Normal bilateral ABI's on 05-27-16 with bi and triphasic waveforms.    Medical Decision Making  Justin [name] is a 84 y.o. male who presents s/p EVAR (Date: 06/03/2016).  He also has bilateral extracranial carotid artery stenosis. He has no history of stroke or TIA. He has known lumbar spine issues, and sees Dr. Vertell Limber for this He has no new back pain.      I discussed with the patient the importance of surveillance of the endograft.  Follow up in 6 months with carotid duplex.   The next  endograft duplex will be scheduled for 12  months.  I emphasized the importance of maximal medical management including strict control of blood pressure, blood glucose, and lipid levels, antiplatelet agents, obtaining regular exercise, and cessation of smoking.   Thank you for allowing [place] to participate in this patient's care.  Clemon Chambers, RN, MSN, FNP-C [facility] [place] Office: [phone]  Clinic Physician: Early  07/20/2017, 9:54 AM

## 2017-07-20 NOTE — Progress Notes (Signed)
Vitals:   07/20/17 0933 07/20/17 0947  BP: (!) 183/95 (!) 166/89  Pulse: 72 72  Resp: 16   Temp: 97.7 F (36.5 C)   TempSrc: Oral   SpO2: 100%   Weight: 152 lb (68.9 kg)   Height: 5' 10.5" (1.791 m)

## 2017-07-20 NOTE — Patient Instructions (Signed)

## 2017-07-28 ENCOUNTER — Other Ambulatory Visit: Payer: Self-pay

## 2017-07-28 DIAGNOSIS — I6523 Occlusion and stenosis of bilateral carotid arteries: Secondary | ICD-10-CM

## 2017-07-31 ENCOUNTER — Other Ambulatory Visit: Payer: Self-pay

## 2017-07-31 ENCOUNTER — Emergency Department (HOSPITAL_COMMUNITY)
Admission: EM | Admit: 2017-07-31 | Discharge: 2017-07-31 | Disposition: A | Discharge status: Home / Self Care | Payer: Medicare Other | Attending: Emergency Medicine | Admitting: Emergency Medicine

## 2017-07-31 ENCOUNTER — Encounter (HOSPITAL_COMMUNITY): Payer: Self-pay

## 2017-07-31 DIAGNOSIS — Z7982 Long term (current) use of aspirin: Secondary | ICD-10-CM | POA: Insufficient documentation

## 2017-07-31 DIAGNOSIS — Z87891 Personal history of nicotine dependence: Secondary | ICD-10-CM | POA: Insufficient documentation

## 2017-07-31 DIAGNOSIS — N183 Chronic kidney disease, stage 3 (moderate): Secondary | ICD-10-CM | POA: Insufficient documentation

## 2017-07-31 DIAGNOSIS — H5789 Other specified disorders of eye and adnexa: Secondary | ICD-10-CM | POA: Diagnosis not present

## 2017-07-31 DIAGNOSIS — I129 Hypertensive chronic kidney disease with stage 1 through stage 4 chronic kidney disease, or unspecified chronic kidney disease: Secondary | ICD-10-CM | POA: Diagnosis not present

## 2017-07-31 DIAGNOSIS — H1132 Conjunctival hemorrhage, left eye: Secondary | ICD-10-CM | POA: Insufficient documentation

## 2017-07-31 DIAGNOSIS — Z85828 Personal history of other malignant neoplasm of skin: Secondary | ICD-10-CM | POA: Diagnosis not present

## 2017-07-31 HISTORY — DX: Disorder of arteries and arterioles, unspecified: I77.9

## 2017-07-31 HISTORY — DX: Peripheral vascular disease, unspecified: I73.9

## 2017-07-31 MED ORDER — TETRACAINE HCL 0.5 % OP SOLN
2.0000 [drp] | Freq: Once | OPHTHALMIC | Status: AC
  Administered 2017-07-31: 2 [drp] via OPHTHALMIC
  Filled 2017-07-31: qty 4

## 2017-07-31 NOTE — ED Provider Notes (Signed)
[facility] DEPT Provider Note   CSN: [id number] Arrival date & time: 07/31/17  1851     History   Chief Complaint Chief Complaint  Patient presents with  . eye redness  . Eye Pain    HPI Justin [name] is a 84 y.o. male.  Complaint is "my left eye is red".  HPI 84 year old male.  History of glaucoma.  On typical, multiple drops.  His family noticed that his left eye was getting red and is "getting worse".  He states it is "noticeable".  However does not discharge and, painful, or affecting his vision.  Past Medical History:  Diagnosis Date  . AAA (abdominal aortic aneurysm) ([place])    "we've known about it since ~ 2000"  . AAA (abdominal aortic aneurysm) ([place]) 06/03/2016  . Arthritis    in back and hands:  Gout  . Atrial fibrillation ([place])    a. 04/2011 post op  . BPH (benign prostatic hyperplasia)   . Bronchitis    hx of; "once or twice in my life" (04/21/11)  . Cancer ([place])    Basal Cell carcinoma- per Dr Jacquiline Doe office notes  . Carotid artery disease ([place])   . Chronic kidney disease    stage 3 - per Dr Jacquiline Doe office notes  . Chronic lower back pain 1995  . Eczema   . GERD (gastroesophageal reflux disease)   . Gout of big toe    "h/o in both"  . Hiatal hernia   . History of kidney stones   . Hypertension    Takes lisinopril/hctz  . Lumbar spondylosis   . Lumbar stenosis    a. s/p  anterolateral decompression and lateral plating 04/21/2011  . Migraine    "left ~ 1980's"  . Stomach ulcer    "years ago; treated w/diet"     Patient Active Problem List   Diagnosis Date Noted  . AAA (abdominal aortic aneurysm) ([place]) 06/03/2016  . Atrial fibrillation ([place]) 04/23/2011  . Abdominal aneurysm without mention of rupture 02/23/2011    Past Surgical History:  Procedure Laterality Date  . ABDOMINAL AORTA STENT  06/03/2016   ABDOMINAL AORTIC ENDOVASCULAR STENT GRAFT insertion (N/A)  . ABDOMINAL AORTIC ENDOVASCULAR STENT GRAFT N/A  06/03/2016   Procedure: ABDOMINAL AORTIC ENDOVASCULAR STENT GRAFT insertion;  Surgeon: Serafina Mitchell, MD;  Location: [facility];  Service: Vascular;  Laterality: N/A;  . ANTERIOR LAT LUMBAR FUSION  04/21/11   w/PEEK cage, allograft, and lateral plate (r)  . ANTERIOR LAT LUMBAR FUSION  04/21/2011   Procedure: ANTERIOR LATERAL LUMBAR FUSION 1 LEVEL;  Surgeon: Erline Levine, MD;  Location: [facility];  Service: Neurosurgery;  Laterality: Right;  RIGHT Lumbar three-four anterolateral decompression with fusion and lateral plating  . APPENDECTOMY  1946  . BACK SURGERY  04/21/11   "today makes the 5th time"  . CATARACT EXTRACTION, BILATERAL    . EYE SURGERY Bilateral    Cataract  . PROSTATE SURGERY    . SPINE SURGERY     X's 5   . TONSILLECTOMY     "as a child" and Adneoids        Home Medications    Prior to Admission medications   Medication Sig Start Date End Date Taking? Authorizing Provider  acetaminophen (TYLENOL) 500 MG tablet Take 1,000 mg by mouth daily as needed (back pain).   Yes [provider]  aspirin EC 81 MG tablet Take 81 mg by mouth daily.    Yes [provider]  dutasteride (AVODART) 0.5 MG capsule Take 0.5 mg by mouth daily. Monday and Fridays   Yes [provider]  lisinopril-hydrochlorothiazide (PRINZIDE,ZESTORETIC) 20-12.5 MG per tablet Take 1 tablet by mouth 2 (two) times daily.    Yes [provider]  White Petrolatum-Mineral Oil (LUBRICANT EYE OP) Place 1 drop into both eyes daily as needed (dry eye).   Yes [provider]  bisacodyl (DULCOLAX) 5 MG EC tablet Take 1 tablet (5 mg total) by mouth daily as needed for moderate constipation. Patient not taking: Reported on 07/20/2017 06/04/16   Virgina Jock A, PA-C  brimonidine (ALPHAGAN) 0.2 % ophthalmic solution Place 1 drop into both eyes 2 (two) times daily.  04/10/16   [provider]  dorzolamide-timolol (COSOPT) 22.3-6.8 MG/ML ophthalmic solution Place 1 drop into  both eyes 2 (two) times daily.     [provider]  LUMIGAN 0.01 % SOLN Place 1 drop into both eyes 2 (two) times a week. Mondays and Fridays 01/20/14   [provider]  methylPREDNISolone (MEDROL DOSEPAK) 4 MG TBPK tablet Take as directed on pack Patient not taking: Reported on 07/20/2017 09/21/16   Nickel, Sharmon Leyden, NP  Naproxen Sodium (ALEVE) 220 MG CAPS Take by mouth.    [provider]  TRAVATAN Z 0.004 % SOLN ophthalmic solution Place 1 drop into the left eye. On odd days 07/19/17   [provider]  triamcinolone cream (KENALOG) 0.1 % Apply 1 application topically daily as needed (rash).  05/05/16   [provider]    Family History Family History  Problem Relation Age of Onset  . Alzheimer's disease Mother        died @ 39  . COPD Father        died @ 81  . Cancer Father   . Hypertension Father   . Heart disease Father   . Stroke Sister   . Cancer Brother        stomach  . Heart disease Brother        Aneurysm  . Hypertension Son   . Heart disease Son        Aneurysm  . Hypertension Son   . Heart disease Son        Aneurysm  . Anesthesia problems Neg Hx   . Hypotension Neg Hx   . Malignant hyperthermia Neg Hx   . Pseudochol deficiency Neg Hx     Social History Social History   Tobacco Use  . Smoking status: Former Smoker    Packs/day: 1.00    Years: 20.00    Pack years: 20.00    Types: Cigarettes    Last attempt to quit: 02/10/1980    Years since quitting: 37.4  . Smokeless tobacco: Never Used  Substance Use Topics  . Alcohol use: No  . Drug use: No     Allergies   Demerol and Meperidine   Review of Systems Review of Systems  Constitutional: Negative for appetite change, chills, diaphoresis, fatigue and fever.  HENT: Negative for mouth sores, sore throat and trouble swallowing.   Eyes: Positive for redness. Negative for visual disturbance.  Respiratory: Negative for cough, chest tightness, shortness of  breath and wheezing.   Cardiovascular: Negative for chest pain.  Gastrointestinal: Negative for abdominal distention, abdominal pain, diarrhea, nausea and vomiting.  Endocrine: Negative for polydipsia, polyphagia and polyuria.  Genitourinary: Negative for dysuria, frequency and hematuria.  Musculoskeletal: Negative for gait problem.  Skin: Negative for color change, pallor  and rash.  Neurological: Negative for dizziness, syncope, light-headedness and headaches.  Hematological: Does not bruise/bleed easily.  Psychiatric/Behavioral: Negative for behavioral problems and confusion.     Physical Exam Updated Vital Signs BP (!) 181/82 (BP Location: Right Arm)   Pulse 87   Temp 98.2 F (36.8 C) (Oral)   Resp 18   Ht 5' 10.5" (1.791 m)   Wt 68.9 kg (152 lb)   SpO2 97%   BMI 21.50 kg/m   Physical Exam  Constitutional: He is oriented to person, place, and time. He appears well-developed and well-nourished. No distress.  HENT:  Head: Normocephalic.  Eyes: Pupils are equal, round, and reactive to light. No scleral icterus.  Left sub conjunctival bridge, nearly circumferential.  Pupils equal round and reactive.  Full movements.  Slit-lamp exam shows no foreign body.  Pressure is 4.  No focal uptake with fluorescein  Neck: Normal range of motion. Neck supple. No thyromegaly present.  Cardiovascular: Normal rate and regular rhythm. Exam reveals no gallop and no friction rub.  No murmur heard. Pulmonary/Chest: Effort normal and breath sounds normal. No respiratory distress. He has no wheezes. He has no rales.  Abdominal: Soft. Bowel sounds are normal. He exhibits no distension. There is no tenderness. There is no rebound.  Musculoskeletal: Normal range of motion.  Neurological: He is alert and oriented to person, place, and time.  Skin: Skin is warm and dry. No rash noted.  Psychiatric: He has a normal mood and affect. His behavior is normal.     ED Treatments / Results  Labs (all labs  ordered are listed, but only abnormal results are displayed) Labs Reviewed - No data to display  EKG None  Radiology No results found.  Procedures Procedures (including critical care time)  Medications Ordered in ED Medications  tetracaine (PONTOCAINE) 0.5 % ophthalmic solution 2 drop (2 drops Left Eye Given by Other 07/31/17 1930)     Initial Impression / Assessment and Plan / ED Course  I have reviewed the triage vital signs and the nursing notes.  Pertinent labs & imaging results that were available during my care of the patient were reviewed by me and considered in my medical decision making (see chart for details).    Reassured.  Expectant management.  Continue daily baby aspirin.  Final Clinical Impressions(s) / ED Diagnoses   Final diagnoses:  Subconjunctival hemorrhage of left eye    ED Discharge Orders    None       Tanna Furry, MD 07/31/17 8250

## 2017-07-31 NOTE — Discharge Instructions (Addendum)
Routine follow up with PCP or Ophthalmologist.

## 2017-07-31 NOTE — ED Triage Notes (Signed)
Patient has eye redness and eye pain since last week, but worse x 2 days.

## 2017-08-03 DIAGNOSIS — Z961 Presence of intraocular lens: Secondary | ICD-10-CM | POA: Diagnosis not present

## 2017-08-03 DIAGNOSIS — H353132 Nonexudative age-related macular degeneration, bilateral, intermediate dry stage: Secondary | ICD-10-CM | POA: Insufficient documentation

## 2017-08-03 DIAGNOSIS — H1132 Conjunctival hemorrhage, left eye: Secondary | ICD-10-CM | POA: Diagnosis not present

## 2017-08-03 DIAGNOSIS — H35362 Drusen (degenerative) of macula, left eye: Secondary | ICD-10-CM | POA: Diagnosis not present

## 2017-08-03 DIAGNOSIS — H401131 Primary open-angle glaucoma, bilateral, mild stage: Secondary | ICD-10-CM | POA: Diagnosis not present

## 2017-08-19 DIAGNOSIS — I10 Essential (primary) hypertension: Secondary | ICD-10-CM | POA: Diagnosis not present

## 2017-08-19 DIAGNOSIS — M5416 Radiculopathy, lumbar region: Secondary | ICD-10-CM | POA: Diagnosis not present

## 2017-08-19 DIAGNOSIS — M545 Low back pain: Secondary | ICD-10-CM | POA: Diagnosis not present

## 2017-08-19 DIAGNOSIS — M47818 Spondylosis without myelopathy or radiculopathy, sacral and sacrococcygeal region: Secondary | ICD-10-CM | POA: Diagnosis not present

## 2017-09-16 DIAGNOSIS — M545 Low back pain: Secondary | ICD-10-CM | POA: Diagnosis not present

## 2017-09-16 DIAGNOSIS — M47818 Spondylosis without myelopathy or radiculopathy, sacral and sacrococcygeal region: Secondary | ICD-10-CM | POA: Diagnosis not present

## 2017-09-16 DIAGNOSIS — M4000 Postural kyphosis, site unspecified: Secondary | ICD-10-CM | POA: Diagnosis not present

## 2017-09-16 DIAGNOSIS — I10 Essential (primary) hypertension: Secondary | ICD-10-CM | POA: Diagnosis not present

## 2017-09-16 DIAGNOSIS — M5416 Radiculopathy, lumbar region: Secondary | ICD-10-CM | POA: Diagnosis not present

## 2017-09-27 ENCOUNTER — Other Ambulatory Visit (HOSPITAL_COMMUNITY): Payer: Medicare Other

## 2017-09-27 ENCOUNTER — Encounter (HOSPITAL_COMMUNITY): Payer: Medicare Other

## 2017-09-27 ENCOUNTER — Ambulatory Visit: Payer: Medicare Other | Admitting: Family

## 2017-10-25 DIAGNOSIS — Z6823 Body mass index (BMI) 23.0-23.9, adult: Secondary | ICD-10-CM | POA: Diagnosis not present

## 2017-10-25 DIAGNOSIS — R0789 Other chest pain: Secondary | ICD-10-CM | POA: Diagnosis not present

## 2017-10-25 DIAGNOSIS — R413 Other amnesia: Secondary | ICD-10-CM | POA: Diagnosis not present

## 2017-10-25 DIAGNOSIS — D692 Other nonthrombocytopenic purpura: Secondary | ICD-10-CM | POA: Diagnosis not present

## 2017-10-25 DIAGNOSIS — Q849 Congenital malformation of integument, unspecified: Secondary | ICD-10-CM | POA: Diagnosis not present

## 2017-10-25 DIAGNOSIS — R42 Dizziness and giddiness: Secondary | ICD-10-CM | POA: Diagnosis not present

## 2017-10-29 DIAGNOSIS — L249 Irritant contact dermatitis, unspecified cause: Secondary | ICD-10-CM | POA: Diagnosis not present

## 2017-11-20 ENCOUNTER — Inpatient Hospital Stay (HOSPITAL_COMMUNITY)
Admission: EM | Admit: 2017-11-20 | Discharge: 2017-11-24 | DRG: 281 | Disposition: A | Discharge status: Home Health | Payer: Medicare Other | Attending: Cardiology | Admitting: Cardiology

## 2017-11-20 ENCOUNTER — Emergency Department (HOSPITAL_COMMUNITY): Payer: Medicare Other

## 2017-11-20 ENCOUNTER — Encounter (HOSPITAL_COMMUNITY): Payer: Self-pay | Admitting: Emergency Medicine

## 2017-11-20 DIAGNOSIS — I10 Essential (primary) hypertension: Secondary | ICD-10-CM | POA: Diagnosis not present

## 2017-11-20 DIAGNOSIS — Z23 Encounter for immunization: Secondary | ICD-10-CM | POA: Diagnosis not present

## 2017-11-20 DIAGNOSIS — Z87442 Personal history of urinary calculi: Secondary | ICD-10-CM

## 2017-11-20 DIAGNOSIS — R079 Chest pain, unspecified: Secondary | ICD-10-CM | POA: Diagnosis not present

## 2017-11-20 DIAGNOSIS — Z888 Allergy status to other drugs, medicaments and biological substances status: Secondary | ICD-10-CM

## 2017-11-20 DIAGNOSIS — Z8249 Family history of ischemic heart disease and other diseases of the circulatory system: Secondary | ICD-10-CM | POA: Diagnosis not present

## 2017-11-20 DIAGNOSIS — R008 Other abnormalities of heart beat: Secondary | ICD-10-CM | POA: Diagnosis present

## 2017-11-20 DIAGNOSIS — K219 Gastro-esophageal reflux disease without esophagitis: Secondary | ICD-10-CM | POA: Diagnosis present

## 2017-11-20 DIAGNOSIS — I6523 Occlusion and stenosis of bilateral carotid arteries: Secondary | ICD-10-CM

## 2017-11-20 DIAGNOSIS — Z87891 Personal history of nicotine dependence: Secondary | ICD-10-CM

## 2017-11-20 DIAGNOSIS — Z7982 Long term (current) use of aspirin: Secondary | ICD-10-CM | POA: Diagnosis not present

## 2017-11-20 DIAGNOSIS — I050 Rheumatic mitral stenosis: Secondary | ICD-10-CM | POA: Diagnosis present

## 2017-11-20 DIAGNOSIS — F05 Delirium due to known physiological condition: Secondary | ICD-10-CM | POA: Diagnosis not present

## 2017-11-20 DIAGNOSIS — R0789 Other chest pain: Secondary | ICD-10-CM | POA: Diagnosis not present

## 2017-11-20 DIAGNOSIS — Z79899 Other long term (current) drug therapy: Secondary | ICD-10-CM

## 2017-11-20 DIAGNOSIS — I200 Unstable angina: Secondary | ICD-10-CM

## 2017-11-20 DIAGNOSIS — I2510 Atherosclerotic heart disease of native coronary artery without angina pectoris: Secondary | ICD-10-CM | POA: Diagnosis not present

## 2017-11-20 DIAGNOSIS — N400 Enlarged prostate without lower urinary tract symptoms: Secondary | ICD-10-CM | POA: Diagnosis present

## 2017-11-20 DIAGNOSIS — Z8679 Personal history of other diseases of the circulatory system: Secondary | ICD-10-CM

## 2017-11-20 DIAGNOSIS — I25110 Atherosclerotic heart disease of native coronary artery with unstable angina pectoris: Secondary | ICD-10-CM | POA: Diagnosis present

## 2017-11-20 DIAGNOSIS — F0390 Unspecified dementia without behavioral disturbance: Secondary | ICD-10-CM | POA: Diagnosis present

## 2017-11-20 DIAGNOSIS — I700 Atherosclerosis of aorta: Secondary | ICD-10-CM | POA: Diagnosis present

## 2017-11-20 DIAGNOSIS — N182 Chronic kidney disease, stage 2 (mild): Secondary | ICD-10-CM | POA: Diagnosis present

## 2017-11-20 DIAGNOSIS — I214 Non-ST elevation (NSTEMI) myocardial infarction: Principal | ICD-10-CM | POA: Diagnosis present

## 2017-11-20 DIAGNOSIS — Z9889 Other specified postprocedural states: Secondary | ICD-10-CM

## 2017-11-20 DIAGNOSIS — I129 Hypertensive chronic kidney disease with stage 1 through stage 4 chronic kidney disease, or unspecified chronic kidney disease: Secondary | ICD-10-CM | POA: Diagnosis present

## 2017-11-20 DIAGNOSIS — I493 Ventricular premature depolarization: Secondary | ICD-10-CM | POA: Diagnosis present

## 2017-11-20 DIAGNOSIS — Z85828 Personal history of other malignant neoplasm of skin: Secondary | ICD-10-CM

## 2017-11-20 LAB — BASIC METABOLIC PANEL
ANION GAP: 14 (ref 5–15)
BUN: 41 mg/dL — ABNORMAL HIGH (ref 8–23)
CO2: 23 mmol/L (ref 22–32)
Calcium: 9.5 mg/dL (ref 8.9–10.3)
Chloride: 102 mmol/L (ref 98–111)
Creatinine, Ser: 1.53 mg/dL — ABNORMAL HIGH (ref 0.61–1.24)
GFR calc Af Amer: 44 mL/min — ABNORMAL LOW (ref >60)
GFR, EST NON AFRICAN AMERICAN: 38 mL/min — AB (ref >60)
GLUCOSE: 183 mg/dL — AB (ref 70–99)
POTASSIUM: 3.4 mmol/L — AB (ref 3.5–5.1)
Sodium: 139 mmol/L (ref 135–145)

## 2017-11-20 LAB — CBC
HCT: 48.2 % (ref 39.0–52.0)
Hemoglobin: 14.9 g/dL (ref 13.0–17.0)
MCH: 30.7 pg (ref 26.0–34.0)
MCHC: 30.9 g/dL (ref 30.0–36.0)
MCV: 99.4 fL (ref 80.0–100.0)
PLATELETS: 250 10*3/uL (ref 150–400)
RBC: 4.85 MIL/uL (ref 4.22–5.81)
RDW: 12.1 % (ref 11.5–15.5)
WBC: 7.1 10*3/uL (ref 4.0–10.5)
nRBC: 0 % (ref 0.0–0.2)

## 2017-11-20 LAB — I-STAT TROPONIN, ED
TROPONIN I, POC: 0.04 ng/mL (ref 0.00–0.08)
TROPONIN I, POC: 0.68 ng/mL — AB (ref 0.00–0.08)

## 2017-11-20 MED ORDER — ASPIRIN EC 81 MG PO TBEC: 81.0000 mg | DELAYED_RELEASE_TABLET | Freq: Every day | ORAL | Status: DC

## 2017-11-20 MED ORDER — METOPROLOL TARTRATE 25 MG PO TABS
25.0000 mg | ORAL_TABLET | Freq: Two times a day (BID) | ORAL | Status: DC
  Administered 2017-11-21 – 2017-11-23 (×6): 25 mg via ORAL
  Filled 2017-11-20 (×6): qty 1

## 2017-11-20 MED ORDER — HEPARIN (PORCINE) IN NACL 100-0.45 UNIT/ML-% IJ SOLN
850.0000 [IU]/h | INTRAMUSCULAR | Status: DC
  Administered 2017-11-20: 850 [IU]/h via INTRAVENOUS
  Filled 2017-11-20 (×2): qty 250

## 2017-11-20 MED ORDER — ASPIRIN 81 MG PO CHEW
324.0000 mg | CHEWABLE_TABLET | Freq: Once | ORAL | Status: AC
  Administered 2017-11-20: 324 mg via ORAL
  Filled 2017-11-20: qty 4

## 2017-11-20 MED ORDER — NITROGLYCERIN 0.4 MG SL SUBL: 0.4000 mg | SUBLINGUAL_TABLET | SUBLINGUAL | Status: DC | PRN

## 2017-11-20 MED ORDER — DUTASTERIDE 0.5 MG PO CAPS
0.5000 mg | ORAL_CAPSULE | Freq: Every day | ORAL | Status: DC
  Administered 2017-11-21 – 2017-11-24 (×4): 0.5 mg via ORAL
  Filled 2017-11-20 (×4): qty 1

## 2017-11-20 MED ORDER — HEPARIN BOLUS VIA INFUSION
850.0000 [IU] | Freq: Once | INTRAVENOUS | Status: AC
  Administered 2017-11-20: 850 [IU] via INTRAVENOUS
  Filled 2017-11-20: qty 850

## 2017-11-20 MED ORDER — ASPIRIN EC 81 MG PO TBEC
81.0000 mg | DELAYED_RELEASE_TABLET | Freq: Every day | ORAL | Status: DC
  Administered 2017-11-21 – 2017-11-24 (×4): 81 mg via ORAL
  Filled 2017-11-20 (×4): qty 1

## 2017-11-20 MED ORDER — DORZOLAMIDE HCL-TIMOLOL MAL 2-0.5 % OP SOLN
1.0000 [drp] | Freq: Two times a day (BID) | OPHTHALMIC | Status: DC
  Administered 2017-11-21 – 2017-11-23 (×3): 1 [drp] via OPHTHALMIC
  Filled 2017-11-20: qty 10

## 2017-11-20 MED ORDER — ACETAMINOPHEN 325 MG PO TABS: 650.0000 mg | ORAL_TABLET | ORAL | Status: DC | PRN

## 2017-11-20 MED ORDER — ROSUVASTATIN CALCIUM 10 MG PO TABS
10.0000 mg | ORAL_TABLET | Freq: Every day | ORAL | Status: DC
  Administered 2017-11-21 – 2017-11-23 (×3): 10 mg via ORAL
  Filled 2017-11-20 (×4): qty 1

## 2017-11-20 MED ORDER — DONEPEZIL HCL 10 MG PO TABS
5.0000 mg | ORAL_TABLET | Freq: Every day | ORAL | Status: DC
  Administered 2017-11-21 – 2017-11-24 (×4): 5 mg via ORAL
  Filled 2017-11-20 (×4): qty 1

## 2017-11-20 MED ORDER — SODIUM CHLORIDE 0.9 % IV SOLN
INTRAVENOUS | Status: AC
  Administered 2017-11-21: 06:00:00 via INTRAVENOUS

## 2017-11-20 MED ORDER — ASPIRIN 300 MG RE SUPP: 300.0000 mg | RECTAL | Status: AC

## 2017-11-20 MED ORDER — ALUM & MAG HYDROXIDE-SIMETH 200-200-20 MG/5ML PO SUSP
30.0000 mL | Freq: Once | ORAL | Status: AC
  Administered 2017-11-20: 30 mL via ORAL
  Filled 2017-11-20: qty 30

## 2017-11-20 MED ORDER — ONDANSETRON HCL 4 MG/2ML IJ SOLN: 4.0000 mg | Freq: Four times a day (QID) | INTRAMUSCULAR | Status: DC | PRN

## 2017-11-20 MED ORDER — ACETAMINOPHEN 500 MG PO TABS: 1000.0000 mg | ORAL_TABLET | Freq: Every day | ORAL | Status: DC | PRN

## 2017-11-20 MED ORDER — ASPIRIN 81 MG PO CHEW: 324.0000 mg | CHEWABLE_TABLET | ORAL | Status: AC

## 2017-11-20 MED ORDER — BISACODYL 5 MG PO TBEC: 5.0000 mg | DELAYED_RELEASE_TABLET | Freq: Every day | ORAL | Status: DC | PRN

## 2017-11-20 MED ORDER — CLOPIDOGREL BISULFATE 75 MG PO TABS
75.0000 mg | ORAL_TABLET | Freq: Once | ORAL | Status: DC
  Filled 2017-11-20: qty 1

## 2017-11-20 NOTE — H&P (Addendum)
Justin [name] is an 84 y.o. male.   Chief Complaint: Chest pain HPI:   84 y/o male with PAD (b/l mod carotid stenosis), s/p endovascular repair of 5.5 cm infrarenal abdominal aorta aneurysm 06/03/2016, hypertension, h/o postop Afib in 2013 with no reported recurrence, presented to [facility] ED on 11/20/2017 with left sided chest pain that started don their way home from dinner. Patient is chest pain-free at this time. Workup showed mild ischemic changes in inferolateral leads and troponin elevation of 0.68 NG/ML.  At baseline, patient is fairly functional 84 year old who has only slowed down recently due to recurrent back pain with prior surgeries. 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 He denies any known bleeding diatheses. He does have mild dementia which is well managed with donepezil.  Past Medical History:  Diagnosis Date  . AAA (abdominal aortic aneurysm) ([place])    "we've known about it since ~ 2000"  . AAA (abdominal aortic aneurysm) ([place]) 06/03/2016  . Arthritis    in back and hands:  Gout  . Atrial fibrillation ([place])    a. 04/2011 post op  . BPH (benign prostatic hyperplasia)   . Bronchitis    hx of; "once or twice in my life" (04/21/11)  . Cancer ([place])    Basal Cell carcinoma- per Dr Jacquiline Doe office notes  . Carotid artery disease ([place])   . Chronic kidney disease    stage 3 - per Dr Jacquiline Doe office notes  . Chronic lower back pain 1995  . Eczema   . GERD (gastroesophageal reflux disease)   . Gout of big toe    "h/o in both"  . Hiatal hernia   . History of kidney stones   . Hypertension    Takes lisinopril/hctz  . Lumbar spondylosis   . Lumbar stenosis    a. s/p  anterolateral decompression and lateral plating 04/21/2011  . Migraine    "left ~ 1980's"  . Stomach ulcer    "years ago; treated w/diet"     Past Surgical History:  Procedure Laterality Date  . ABDOMINAL AORTA STENT  06/03/2016   ABDOMINAL  AORTIC ENDOVASCULAR STENT GRAFT insertion (N/A)  . ABDOMINAL AORTIC ENDOVASCULAR STENT GRAFT N/A 06/03/2016   Procedure: ABDOMINAL AORTIC ENDOVASCULAR STENT GRAFT insertion;  Surgeon: Serafina Mitchell, MD;  Location: [facility];  Service: Vascular;  Laterality: N/A;  . ANTERIOR LAT LUMBAR FUSION  04/21/11   w/PEEK cage, allograft, and lateral plate (r)  . ANTERIOR LAT LUMBAR FUSION  04/21/2011   Procedure: ANTERIOR LATERAL LUMBAR FUSION 1 LEVEL;  Surgeon: Erline Levine, MD;  Location: [facility];  Service: Neurosurgery;  Laterality: Right;  RIGHT Lumbar three-four anterolateral decompression with fusion and lateral plating  . APPENDECTOMY  1946  . BACK SURGERY  04/21/11   "today makes the 5th time"  . CATARACT EXTRACTION, BILATERAL    . EYE SURGERY Bilateral    Cataract  . PROSTATE SURGERY    . SPINE SURGERY     X's 5   . TONSILLECTOMY     "as a child" and Adneoids    Family History  Problem Relation Age of Onset  . Alzheimer's disease Mother        died @ 13  . COPD Father        died @ 38  . Cancer Father   . Hypertension Father   . Heart disease Father   .  Stroke Sister   . Cancer Brother        stomach  . Heart disease Brother        Aneurysm  . Hypertension Son   . Heart disease Son        Aneurysm  . Hypertension Son   . Heart disease Son        Aneurysm  . Anesthesia problems Neg Hx   . Hypotension Neg Hx   . Malignant hyperthermia Neg Hx   . Pseudochol deficiency Neg Hx    Social History:  reports that he quit smoking about 37 years ago. His smoking use included cigarettes. He has a 20.00 pack-year smoking history. He has never used smokeless tobacco. He reports that he does not drink alcohol or use drugs.  Allergies:  Allergies  Allergen Reactions  . Demerol Anaphylaxis  . Meperidine Anaphylaxis     (Not in a hospital admission)  Results for orders placed or performed during the hospital encounter of 11/20/17 (from the past 48 hour(s))  Basic metabolic panel      Status: Abnormal   Collection Time: 11/20/17  6:34 PM  Result Value Ref Range   Sodium 139 135 - 145 mmol/L   Potassium 3.4 (L) 3.5 - 5.1 mmol/L   Chloride 102 98 - 111 mmol/L   CO2 23 22 - 32 mmol/L   Glucose, Bld 183 (H) 70 - 99 mg/dL   BUN 41 (H) 8 - 23 mg/dL   Creatinine, Ser 1.53 (H) 0.61 - 1.24 mg/dL   Calcium 9.5 8.9 - 10.3 mg/dL   GFR calc non Af Amer 38 (L) >60 mL/min   GFR calc Af Amer 44 (L) >60 mL/min    Comment: (NOTE) The eGFR has been calculated using the CKD EPI equation. This calculation has not been validated in all clinical situations. eGFR's persistently <60 mL/min signify possible Chronic Kidney Disease.    Anion gap 14 5 - 15    Comment: Performed at [facility] [address] [place] [address]  CBC     Status: None   Collection Time: 11/20/17  6:34 PM  Result Value Ref Range   WBC 7.1 4.0 - 10.5 K/uL   RBC 4.85 4.22 - 5.81 MIL/uL   Hemoglobin 14.9 13.0 - 17.0 g/dL   HCT 48.2 39.0 - 52.0 %   MCV 99.4 80.0 - 100.0 fL   MCH 30.7 26.0 - 34.0 pg   MCHC 30.9 30.0 - 36.0 g/dL   RDW 12.1 11.5 - 15.5 %   Platelets 250 150 - 400 K/uL   nRBC 0.0 0.0 - 0.2 %    Comment: Performed at [facility], [place] [address], [place], [place] [address]  I-stat troponin, ED     Status: None   Collection Time: 11/20/17  6:43 PM  Result Value Ref Range   Troponin i, poc 0.04 0.00 - 0.08 ng/mL   Comment 3            Comment: Due to the release kinetics of cTnI, a negative result within the first hours of the onset of symptoms does not rule out myocardial infarction with certainty. If myocardial infarction is still suspected, repeat the test at appropriate intervals.   I-stat troponin, ED     Status: Abnormal   Collection Time: 11/20/17  9:22 PM  Result Value Ref Range   Troponin i, poc 0.68 (HH) 0.00 - 0.08 ng/mL   Comment NOTIFIED PHYSICIAN  Comment 3            Comment: Due to the release kinetics of cTnI, a negative result within the  first hours of the onset of symptoms does not rule out myocardial infarction with certainty. If myocardial infarction is still suspected, repeat the test at appropriate intervals.    Dg Chest 2 View  Result Date: 11/20/2017 CLINICAL DATA:  Acute onset chest pain 1 hour ago. EXAM: CHEST - 2 VIEW COMPARISON:  06/03/2016 FINDINGS: The heart size and mediastinal contours are within normal limits. Aortic atherosclerosis. Both lungs are clear. Pulmonary hyperinflation again seen, suspicious for COPD. Mild thoracic spine degenerative changes noted. IMPRESSION: Suspect COPD.  No active cardiopulmonary disease. Electronically Signed   By: Earle Gell M.D.   On: 11/20/2017 19:42   Cardiac studies:  EKG 11/20/2017: Sinus tachycardia, normal axis, normal conduction. Left atrial enlargement. Inferolateral ST depression. Frequent PVC's.  Echocardiogram 04/24/2011: LVEF 65-70%   Review of Systems  Constitutional: Negative.   HENT: Negative.   Eyes:       Uses eye drops  Respiratory: Negative for sputum production and shortness of breath.   Cardiovascular: Positive for chest pain (Now resolved). Negative for palpitations, orthopnea and claudication.  Gastrointestinal: Negative for nausea.  Skin:       Stable mole X2 on his back   Neurological: Negative for dizziness and loss of consciousness.       Mild dementia   Endo/Heme/Allergies: Negative.   Psychiatric/Behavioral: Negative.   All other systems reviewed and are negative.   Blood pressure 114/74, pulse 100, temperature 97.9 F (36.6 C), temperature source Oral, resp. rate 16, SpO2 95 %. Physical Exam  Nursing note and vitals reviewed. Constitutional: He is oriented to person, place, and time. He appears well-developed and well-nourished. No distress.  Appears much younger than stated age  HENT:  Head: Normocephalic and atraumatic.  Eyes: Conjunctivae are normal.  Neck: No JVD present.  Cardiovascular: Normal rate, regular rhythm  and normal heart sounds.  No murmur heard. Pulses:      Carotid pulses are on the right side with bruit, and on the left side with bruit.      Radial pulses are 2+ on the right side, and 2+ on the left side.       Femoral pulses are 3+ on the right side, and 2+ on the left side.      Dorsalis pedis pulses are 1+ on the right side, and 1+ on the left side.       Posterior tibial pulses are 0 on the right side, and 1+ on the left side.  Respiratory: Effort normal and breath sounds normal. He has no wheezes. He has no rales.  GI: Soft. Bowel sounds are normal. There is no tenderness. There is no rebound.  Musculoskeletal: He exhibits no edema.  Lymphadenopathy:    He has no cervical adenopathy.  Neurological: He is alert and oriented to person, place, and time. No cranial nerve deficit.  Skin: Skin is warm and dry.  Warty mole X 2 on the back   Psychiatric: He has a normal mood and affect.     Assessment:  84 y/o male with PAD (b/l mod carotid stenosis), s/p endovascular repair of 5.5 cm infrarenal abdominal aorta aneurysm 06/03/2016, hypertension, h/o postop Afib in 2013 with no reported recurrence, being admitted with NSTEMI  NSTEMI Hypertension B/l moderate carotid artery disease S/p endovascular repair of infrarenal AAA (2018) CKD 2, possible AKI H/o post op  Afib 2013  Plan: Admit to telemetry. ACS dose heparin. Start aspirin with Plavix without loading pending coronary angiogram. Metoprolol 25 mg twice a day. Rosuvastatin 20 mg daily. Hold lisinopril-HCTZ in view of increased creatinine, possibly history of present illness. IV hydration in preparation for coronary angiogram, possibly on 11/22/2017. Echocardiogram in a.m.   Nigel Mormon, MD 11/20/2017, 9:56 PM  Esme Freund Esther Hardy, MD [facility] [place] Pager: [phone] Office: [phone] If no answer Cell [phone]

## 2017-11-20 NOTE — ED Triage Notes (Signed)
Pt presents with constant, stabbing CP x 1 hr to L chest, denies sob, n/v, dizziness, diaphoresis

## 2017-11-20 NOTE — Progress Notes (Signed)
ANTICOAGULATION CONSULT NOTE - Initial Consult  Pharmacy Consult for heparin Indication: chest pain/ACS  Allergies  Allergen Reactions  . Demerol Anaphylaxis  . Meperidine Anaphylaxis    Patient Measurements: Height: 5' 10.5" (179.1 cm) Weight: 155 lb 14 oz (70.7 kg) IBW/kg (Calculated) : 74.15 Heparin Dosing Weight: 70.7 kg  Vital Signs: Temp: 97.9 F (36.6 C) (10/12 1822) Temp Source: Oral (10/12 1822) BP: 114/74 (10/12 2115) Pulse Rate: 100 (10/12 2115)  Labs: Recent Labs    11/20/17 1834  HGB 14.9  HCT 48.2  PLT 250  CREATININE 1.53*    Estimated Creatinine Clearance: 30.8 mL/min (A) (by C-G formula based on SCr of 1.53 mg/dL (H)).   Medical History: Past Medical History:  Diagnosis Date  . AAA (abdominal aortic aneurysm) ([place])    "we've known about it since ~ 2000"  . AAA (abdominal aortic aneurysm) ([place]) 06/03/2016  . Arthritis    in back and hands:  Gout  . Atrial fibrillation ([place])    a. 04/2011 post op  . BPH (benign prostatic hyperplasia)   . Bronchitis    hx of; "once or twice in my life" (04/21/11)  . Cancer ([place])    Basal Cell carcinoma- per Dr Jacquiline Doe office notes  . Carotid artery disease ([place])   . Chronic kidney disease    stage 3 - per Dr Jacquiline Doe office notes  . Chronic lower back pain 1995  . Eczema   . GERD (gastroesophageal reflux disease)   . Gout of big toe    "h/o in both"  . Hiatal hernia   . History of kidney stones   . Hypertension    Takes lisinopril/hctz  . Lumbar spondylosis   . Lumbar stenosis    a. s/p  anterolateral decompression and lateral plating 04/21/2011  . Migraine    "left ~ 1980's"  . Stomach ulcer    "years ago; treated w/diet"    Assessment: 74 YOM presented to ED with constant, stabbing chest pain. Patient denies SOB, N/V, dizziness, and diaphoresis. Pharmacy consulted to start heparin for possible ACS. CBC unremarkable.  Goal of Therapy:  Heparin level 0.3-0.7 units/ml Monitor platelets by  anticoagulation protocol: Yes   Plan:  Give 4,000 units bolus x 1 Start heparin infusion at 850 units/hr Check anti-Xa level in 8 hours and daily while on heparin Continue to monitor H&H and platelets  Camillia Marcy A Debbrah Sampedro 11/20/2017,10:17 PM

## 2017-11-20 NOTE — ED Provider Notes (Signed)
[facility] EMERGENCY DEPARTMENT Provider Note   CSN: [id number] Arrival date & time: 11/20/17  1816     History   Chief Complaint Chief Complaint  Patient presents with  . Chest Pain    HPI Justin [name] is a 84 y.o. male.  HPI Functional 84 year old male presents to the emergency department with complaints of sharp left-sided chest pain which began earlier today but then worsened tonight after dinner.  These were transient would come and go.  No significant associated shortness of breath.  Denies nausea vomiting.  No history of known coronary artery disease.  No history of prior heart catheterization.  He does have a history of aortic abdominal aneurysm treated with endovascular repair as well as a history of known left carotid artery disease.  He is never had chest pain like this before.  He presents the emergency department for further evaluation with his significant other.  On arrival to the emergency department the patient's pain is improved   Past Medical History:  Diagnosis Date  . AAA (abdominal aortic aneurysm) ([place])    "we've known about it since ~ 2000"  . AAA (abdominal aortic aneurysm) ([place]) 06/03/2016  . Arthritis    in back and hands:  Gout  . Atrial fibrillation ([place])    a. 04/2011 post op  . BPH (benign prostatic hyperplasia)   . Bronchitis    hx of; "once or twice in my life" (04/21/11)  . Cancer ([place])    Basal Cell carcinoma- per Dr Jacquiline Doe office notes  . Carotid artery disease ([place])   . Chronic kidney disease    stage 3 - per Dr Jacquiline Doe office notes  . Chronic lower back pain 1995  . Eczema   . GERD (gastroesophageal reflux disease)   . Gout of big toe    "h/o in both"  . Hiatal hernia   . History of kidney stones   . Hypertension    Takes lisinopril/hctz  . Lumbar spondylosis   . Lumbar stenosis    a. s/p  anterolateral decompression and lateral plating 04/21/2011  . Migraine    "left ~ 1980's"  . Stomach ulcer    "years ago; treated w/diet"     Patient Active Problem List   Diagnosis Date Noted  . AAA (abdominal aortic aneurysm) ([place]) 06/03/2016  . Atrial fibrillation ([place]) 04/23/2011  . Abdominal aneurysm without mention of rupture 02/23/2011    Past Surgical History:  Procedure Laterality Date  . ABDOMINAL AORTA STENT  06/03/2016   ABDOMINAL AORTIC ENDOVASCULAR STENT GRAFT insertion (N/A)  . ABDOMINAL AORTIC ENDOVASCULAR STENT GRAFT N/A 06/03/2016   Procedure: ABDOMINAL AORTIC ENDOVASCULAR STENT GRAFT insertion;  Surgeon: Serafina Mitchell, MD;  Location: [facility];  Service: Vascular;  Laterality: N/A;  . ANTERIOR LAT LUMBAR FUSION  04/21/11   w/PEEK cage, allograft, and lateral plate (r)  . ANTERIOR LAT LUMBAR FUSION  04/21/2011   Procedure: ANTERIOR LATERAL LUMBAR FUSION 1 LEVEL;  Surgeon: Erline Levine, MD;  Location: [facility];  Service: Neurosurgery;  Laterality: Right;  RIGHT Lumbar three-four anterolateral decompression with fusion and lateral plating  . APPENDECTOMY  1946  . BACK SURGERY  04/21/11   "today makes the 5th time"  . CATARACT EXTRACTION, BILATERAL    . EYE SURGERY Bilateral    Cataract  . PROSTATE SURGERY    . SPINE SURGERY     X's 5   . TONSILLECTOMY     "as a child" and Adneoids  Home Medications    Prior to Admission medications   Medication Sig Start Date End Date Taking? Authorizing Provider  acetaminophen (TYLENOL) 500 MG tablet Take 1,000 mg by mouth daily as needed (back pain).    [provider]  aspirin EC 81 MG tablet Take 81 mg by mouth daily.     [provider]  bisacodyl (DULCOLAX) 5 MG EC tablet Take 1 tablet (5 mg total) by mouth daily as needed for moderate constipation. Patient not taking: Reported on 07/20/2017 06/04/16   Virgina Jock A, PA-C  brimonidine (ALPHAGAN) 0.2 % ophthalmic solution Place 1 drop into both eyes 2 (two) times daily.  04/10/16   [provider]  dorzolamide-timolol (COSOPT) 22.3-6.8 MG/ML  ophthalmic solution Place 1 drop into both eyes 2 (two) times daily.     [provider]  dutasteride (AVODART) 0.5 MG capsule Take 0.5 mg by mouth daily. Monday and Fridays    [provider]  lisinopril-hydrochlorothiazide (PRINZIDE,ZESTORETIC) 20-12.5 MG per tablet Take 1 tablet by mouth 2 (two) times daily.     [provider]  LUMIGAN 0.01 % SOLN Place 1 drop into both eyes 2 (two) times a week. Mondays and Fridays 01/20/14   [provider]  methylPREDNISolone (MEDROL DOSEPAK) 4 MG TBPK tablet Take as directed on pack Patient not taking: Reported on 07/20/2017 09/21/16   Nickel, Sharmon Leyden, NP  Naproxen Sodium (ALEVE) 220 MG CAPS Take by mouth.    [provider]  TRAVATAN Z 0.004 % SOLN ophthalmic solution Place 1 drop into the left eye. On odd days 07/19/17   [provider]  triamcinolone cream (KENALOG) 0.1 % Apply 1 application topically daily as needed (rash).  05/05/16   [provider]  White Petrolatum-Mineral Oil (LUBRICANT EYE OP) Place 1 drop into both eyes daily as needed (dry eye).    [provider]    Family History Family History  Problem Relation Age of Onset  . Alzheimer's disease Mother        died @ 72  . COPD Father        died @ 47  . Cancer Father   . Hypertension Father   . Heart disease Father   . Stroke Sister   . Cancer Brother        stomach  . Heart disease Brother        Aneurysm  . Hypertension Son   . Heart disease Son        Aneurysm  . Hypertension Son   . Heart disease Son        Aneurysm  . Anesthesia problems Neg Hx   . Hypotension Neg Hx   . Malignant hyperthermia Neg Hx   . Pseudochol deficiency Neg Hx     Social History Social History   Tobacco Use  . Smoking status: Former Smoker    Packs/day: 1.00    Years: 20.00    Pack years: 20.00    Types: Cigarettes    Last attempt to quit: 02/10/1980    Years since quitting: 37.8  . Smokeless tobacco: Never Used    Substance Use Topics  . Alcohol use: No  . Drug use: No     Allergies   Demerol and Meperidine   Review of Systems Review of Systems  All other systems reviewed and are negative.    Physical Exam Updated Vital Signs BP 114/74   Pulse 100   Temp 97.9 F (36.6 C) (Oral)  Resp 16   SpO2 95%   Physical Exam  Constitutional: He is oriented to person, place, and time. He appears well-developed and well-nourished.  HENT:  Head: Normocephalic and atraumatic.  Eyes: EOM are normal.  Neck: Normal range of motion.  Cardiovascular: Normal rate, regular rhythm, normal heart sounds and intact distal pulses.  Pulmonary/Chest: Effort normal and breath sounds normal. No respiratory distress.  Abdominal: Soft. He exhibits no distension. There is no tenderness.  Musculoskeletal: Normal range of motion.  Neurological: He is alert and oriented to person, place, and time.  Skin: Skin is warm and dry.  Psychiatric: He has a normal mood and affect. Judgment normal.  Nursing note and vitals reviewed.    ED Treatments / Results  Labs (all labs ordered are listed, but only abnormal results are displayed) Labs Reviewed  BASIC METABOLIC PANEL - Abnormal; Notable for the following components:      Result Value   Potassium 3.4 (*)    Glucose, Bld 183 (*)    BUN 41 (*)    Creatinine, Ser 1.53 (*)    GFR calc non Af Amer 38 (*)    GFR calc Af Amer 44 (*)    All other components within normal limits  I-STAT TROPONIN, ED - Abnormal; Notable for the following components:   Troponin i, poc 0.68 (*)    All other components within normal limits  CBC  I-STAT TROPONIN, ED    EKG EKG Interpretation  Date/Time:  Saturday November 20 2017 18:18:02 EDT Ventricular Rate:  115 PR Interval:  154 QRS Duration: 94 QT Interval:  342 QTC Calculation: 473 R Axis:   15 Text Interpretation:  Sinus tachycardia with frequent Premature ventricular complexes Possible Left atrial enlargement Anterior  infarct , age undetermined Abnormal ECG ** Poor data quality, interpretation may be adversely affected Confirmed by Jola Schmidt [phone]) on 11/20/2017 7:12:46 PM   Radiology Dg Chest 2 View  Result Date: 11/20/2017 CLINICAL DATA:  Acute onset chest pain 1 hour ago. EXAM: CHEST - 2 VIEW COMPARISON:  06/03/2016 FINDINGS: The heart size and mediastinal contours are within normal limits. Aortic atherosclerosis. Both lungs are clear. Pulmonary hyperinflation again seen, suspicious for COPD. Mild thoracic spine degenerative changes noted. IMPRESSION: Suspect COPD.  No active cardiopulmonary disease. Electronically Signed   By: Earle Gell M.D.   On: 11/20/2017 19:42    Procedures .Critical Care Performed by: Jola Schmidt, MD Authorized by: Jola Schmidt, MD     CRITICAL CARE Performed by: Jola Schmidt Total critical care time: 31 minutes Critical care time was exclusive of separately billable procedures and treating other patients. Critical care was necessary to treat or prevent imminent or life-threatening deterioration. Critical care was time spent personally by me on the following activities: development of treatment plan with patient and/or surrogate as well as nursing, discussions with consultants, evaluation of patient's response to treatment, examination of patient, obtaining history from patient or surrogate, ordering and performing treatments and interventions, ordering and review of laboratory studies, ordering and review of radiographic studies, pulse oximetry and re-evaluation of patient's condition.   Medications Ordered in ED Medications  aspirin chewable tablet 324 mg (has no administration in time range)  alum & mag hydroxide-simeth (MAALOX/MYLANTA) 200-200-20 MG/5ML suspension 30 mL (30 mLs Oral Given 11/20/17 1930)     Initial Impression / Assessment and Plan / ED Course  I have reviewed the triage vital signs and the nursing notes.  Pertinent labs & imaging results  that were available  during my care of the patient were reviewed by me and considered in my medical decision making (see chart for details).     Chest pain-free on arrival the emergency department.  Initial EKG with some PVCs but no obvious ischemic changes.  Initial troponin is negative.  3-hour troponin was elevated consistent with non-ST elevation MI/acute coronary syndrome.  Repeat evaluation the patient demonstrates no ongoing chest discomfort.  Case was discussed with cardiology who will admit the patient to hospital for further evaluation.  Cardiology Consult: Dr Virgina Jock, MD  Dispo: admit  Final Clinical Impressions(s) / ED Diagnoses   Final diagnoses:  Unstable angina [facility])    ED Discharge Orders    None       Jola Schmidt, MD 11/20/17 2313

## 2017-11-20 NOTE — ED Notes (Signed)
Patient transported to X-ray 

## 2017-11-21 ENCOUNTER — Inpatient Hospital Stay (HOSPITAL_COMMUNITY): Payer: Medicare Other

## 2017-11-21 ENCOUNTER — Other Ambulatory Visit: Payer: Self-pay

## 2017-11-21 LAB — ECHOCARDIOGRAM COMPLETE
Height: 70 in
WEIGHTICAEL: 2451.52 [oz_av]

## 2017-11-21 LAB — BASIC METABOLIC PANEL
ANION GAP: 9 (ref 5–15)
BUN: 35 mg/dL — ABNORMAL HIGH (ref 8–23)
CO2: 21 mmol/L — AB (ref 22–32)
Calcium: 8.9 mg/dL (ref 8.9–10.3)
Chloride: 108 mmol/L (ref 98–111)
Creatinine, Ser: 1.24 mg/dL (ref 0.61–1.24)
GFR calc Af Amer: 56 mL/min — ABNORMAL LOW (ref >60)
GFR calc non Af Amer: 49 mL/min — ABNORMAL LOW (ref >60)
GLUCOSE: 196 mg/dL — AB (ref 70–99)
POTASSIUM: 3.9 mmol/L (ref 3.5–5.1)
Sodium: 138 mmol/L (ref 135–145)

## 2017-11-21 LAB — HEPARIN LEVEL (UNFRACTIONATED)
Heparin Unfractionated: 0.36 IU/mL (ref 0.30–0.70)
Heparin Unfractionated: 0.31 IU/mL (ref 0.30–0.70)

## 2017-11-21 LAB — LIPID PANEL
Cholesterol: 180 mg/dL (ref 0–200)
HDL: 38 mg/dL — ABNORMAL LOW (ref >40)
LDL CALC: 112 mg/dL — AB (ref 0–99)
TRIGLYCERIDES: 148 mg/dL (ref <150)
Total CHOL/HDL Ratio: 4.7 RATIO
VLDL: 30 mg/dL (ref 0–40)

## 2017-11-21 LAB — CBC
HCT: 41.6 % (ref 39.0–52.0)
HEMOGLOBIN: 13.3 g/dL (ref 13.0–17.0)
MCH: 31.7 pg (ref 26.0–34.0)
MCHC: 32 g/dL (ref 30.0–36.0)
MCV: 99 fL (ref 80.0–100.0)
Platelets: 196 10*3/uL (ref 150–400)
RBC: 4.2 MIL/uL — AB (ref 4.22–5.81)
RDW: 12.2 % (ref 11.5–15.5)
WBC: 6.3 10*3/uL (ref 4.0–10.5)
nRBC: 0 % (ref 0.0–0.2)

## 2017-11-21 LAB — TROPONIN I: Troponin I: 8.36 ng/mL (ref <0.03)

## 2017-11-21 LAB — MRSA PCR SCREENING: MRSA by PCR: NEGATIVE

## 2017-11-21 LAB — HEMOGLOBIN A1C
HEMOGLOBIN A1C: 6 % — AB (ref 4.8–5.6)
MEAN PLASMA GLUCOSE: 125.5 mg/dL

## 2017-11-21 MED ORDER — SODIUM CHLORIDE 0.9 % IV SOLN
INTRAVENOUS | Status: DC
  Administered 2017-11-21: 20:00:00 via INTRAVENOUS

## 2017-11-21 MED ORDER — SODIUM CHLORIDE 0.9 % IV SOLN: 250.0000 mL | INTRAVENOUS | Status: DC | PRN

## 2017-11-21 MED ORDER — SODIUM CHLORIDE 0.9% FLUSH: 3.0000 mL | INTRAVENOUS | Status: DC | PRN

## 2017-11-21 MED ORDER — SODIUM CHLORIDE 0.9 % IV SOLN
INTRAVENOUS | Status: DC
  Administered 2017-11-21: 18:00:00 via INTRAVENOUS

## 2017-11-21 MED ORDER — ASPIRIN 81 MG PO CHEW
81.0000 mg | CHEWABLE_TABLET | ORAL | Status: AC
  Administered 2017-11-22: 81 mg via ORAL
  Filled 2017-11-21: qty 1

## 2017-11-21 MED ORDER — LISINOPRIL 20 MG PO TABS
20.0000 mg | ORAL_TABLET | Freq: Every day | ORAL | Status: DC
  Administered 2017-11-21 – 2017-11-24 (×4): 20 mg via ORAL
  Filled 2017-11-21 (×4): qty 1

## 2017-11-21 MED ORDER — SODIUM CHLORIDE 0.9% FLUSH
3.0000 mL | Freq: Two times a day (BID) | INTRAVENOUS | Status: DC
  Administered 2017-11-21: 3 mL via INTRAVENOUS

## 2017-11-21 MED ORDER — LISINOPRIL-HYDROCHLOROTHIAZIDE 20-12.5 MG PO TABS: 1.0000 | ORAL_TABLET | Freq: Every day | ORAL | Status: DC

## 2017-11-21 MED ORDER — HYDROCHLOROTHIAZIDE 12.5 MG PO CAPS
12.5000 mg | ORAL_CAPSULE | Freq: Every day | ORAL | Status: DC
  Administered 2017-11-21 – 2017-11-24 (×4): 12.5 mg via ORAL
  Filled 2017-11-21 (×4): qty 1

## 2017-11-21 MED ORDER — LISINOPRIL-HYDROCHLOROTHIAZIDE 20-12.5 MG PO TABS: 1.0000 | ORAL_TABLET | Freq: Two times a day (BID) | ORAL | Status: DC

## 2017-11-21 MED ORDER — INFLUENZA VAC SPLIT HIGH-DOSE 0.5 ML IM SUSY
0.5000 mL | PREFILLED_SYRINGE | INTRAMUSCULAR | Status: AC
  Administered 2017-11-24: 0.5 mL via INTRAMUSCULAR
  Filled 2017-11-21: qty 0.5

## 2017-11-21 NOTE — Progress Notes (Signed)
NURSING PROGRESS NOTE  ROD MAJERUS [id number] Admission Data: 11/21/2017 2:46 AM Attending Provider: Nigel Mormon, MD MDY:JWLKHVF, Richard, MD Code Status: Full   Justin [name] is a 84 y.o. male patient admitted from ED:  -No acute distress noted.  -No complaints of shortness of breath.  -No complaints of chest pain.   Cardiac Monitoring: Bedside monitorign in place. Cardiac monitor yields: SR/ST with occasional PVC's  Blood pressure 115/86, pulse 95, temperature 97.9 F (36.6 C), temperature source Oral, resp. rate 14, height 5' 10.5" (1.791 m), weight 70.7 kg, SpO2 96 %.   IV Fluids:  IV in place, occlusive dsg intact without redness, IV cath antecubital right, condition patent and with heparin infusing at 8.5  Allergies:  Demerol and Meperidine  Past Medical History:   has a past medical history of AAA (abdominal aortic aneurysm) ([place]), AAA (abdominal aortic aneurysm) ([place]) (06/03/2016), Arthritis, Atrial fibrillation ([place]), BPH (benign prostatic hyperplasia), Bronchitis, Cancer ([place]), Carotid artery disease ([place]), Chronic kidney disease, Chronic lower back pain (1995), Eczema, GERD (gastroesophageal reflux disease), Gout of big toe, Hiatal hernia, History of kidney stones, Hypertension, Lumbar spondylosis, Lumbar stenosis, Migraine, and Stomach ulcer.  Past Surgical History:   has a past surgical history that includes Prostate surgery; Anterior lat lumbar fusion (04/21/11); Appendectomy (1946); Back surgery (04/21/11); Cataract extraction, bilateral; Anterior lat lumbar fusion (04/21/2011); Eye surgery (Bilateral); Spine surgery; Tonsillectomy; Abdominal aorta stent (06/03/2016); and Abdominal aortic endovascular stent graft (N/A, 06/03/2016).  Social History:   reports that he quit smoking about 37 years ago. His smoking use included cigarettes. He has a 20.00 pack-year smoking history. He has never used smokeless tobacco. He reports that he does not drink alcohol or use  drugs.  Skin: clean, dry and intact  Patient/Family orientated to room. Information packet given to patient/family. Admission inpatient armband information verified with patient/family to include name and date of birth and placed on patient arm. Side rails up x 2, fall assessment and education completed with patient/family. Patient/family able to verbalize understanding of risk associated with falls and verbalized understanding to call for assistance before getting out of bed. Call light within reach. Patient/family able to voice and demonstrate understanding of unit orientation instructions.    Will continue to evaluate and treat per MD orders.

## 2017-11-21 NOTE — Progress Notes (Signed)
[place] for heparin Indication: chest pain/ACS  Allergies  Allergen Reactions  . Demerol Anaphylaxis  . Meperidine Anaphylaxis    Patient Measurements: Height: 5\' 10"  (177.8 cm) Weight: 153 lb 3.5 oz (69.5 kg) IBW/kg (Calculated) : 73 Heparin Dosing Weight: 70.7 kg  Vital Signs: Temp: 97.6 F (36.4 C) (10/13 1655) Temp Source: Oral (10/13 1655) BP: 111/84 (10/13 1655) Pulse Rate: 80 (10/13 1655)  Labs: Recent Labs    11/20/17 1834 11/21/17 0652 11/21/17 0937 11/21/17 1604  HGB 14.9 13.3  --   --   HCT 48.2 41.6  --   --   PLT 250 196  --   --   HEPARINUNFRC  --  0.36  --  0.31  CREATININE 1.53*  --  1.24  --   TROPONINI  --   --  8.36*  --     Estimated Creatinine Clearance: 37.4 mL/min (by C-G formula based on SCr of 1.24 mg/dL).   Medical History: Past Medical History:  Diagnosis Date  . AAA (abdominal aortic aneurysm) ([place])    "we've known about it since ~ 2000"  . AAA (abdominal aortic aneurysm) ([place]) 06/03/2016  . Arthritis    in back and hands:  Gout  . Atrial fibrillation ([place])    a. 04/2011 post op  . BPH (benign prostatic hyperplasia)   . Bronchitis    hx of; "once or twice in my life" (04/21/11)  . Cancer ([place])    Basal Cell carcinoma- per Dr Jacquiline Doe office notes  . Carotid artery disease ([place])   . Chronic kidney disease    stage 3 - per Dr Jacquiline Doe office notes  . Chronic lower back pain 1995  . Eczema   . GERD (gastroesophageal reflux disease)   . Gout of big toe    "h/o in both"  . Hiatal hernia   . History of kidney stones   . Hypertension    Takes lisinopril/hctz  . Lumbar spondylosis   . Lumbar stenosis    a. s/p  anterolateral decompression and lateral plating 04/21/2011  . Migraine    "left ~ 1980's"  . Stomach ulcer    "years ago; treated w/diet"    Assessment: 61 YOM presented to ED with constant, stabbing chest pain. Patient denies SOB, N/V, dizziness, and diaphoresis. Pharmacy  consulted to start heparin for possible ACS. Hgb slight trend down this AM, but no overt bleeding or complications noted. Confirmatory heparin level at goal at 0.31.  Awaiting possible cath tomorrow.  Goal of Therapy:  Heparin level 0.3-0.7 units/ml Monitor platelets by anticoagulation protocol: Yes   Plan:  Continue IV heparin at 850 units/hr. Daily heparin level and CBC. F/u plans for possible cath tomorrow.  Renatha Rosen A. Levada Dy, PharmD, [place] Pager: [phone] Please utilize Amion for appropriate phone number to reach the unit pharmacist ([place])    11/21/2017 5:48 PM

## 2017-11-21 NOTE — Progress Notes (Signed)
[place] for heparin Indication: chest pain/ACS  Allergies  Allergen Reactions  . Demerol Anaphylaxis  . Meperidine Anaphylaxis    Patient Measurements: Height: 5\' 10"  (177.8 cm) Weight: 153 lb 3.5 oz (69.5 kg) IBW/kg (Calculated) : 73 Heparin Dosing Weight: 70.7 kg  Vital Signs: Temp: 97.9 F (36.6 C) (10/13 0738) Temp Source: Oral (10/13 0738) BP: 151/83 (10/13 0738) Pulse Rate: 78 (10/13 0738)  Labs: Recent Labs    11/20/17 1834 11/21/17 0652  HGB 14.9 13.3  HCT 48.2 41.6  PLT 250 196  HEPARINUNFRC  --  0.36  CREATININE 1.53*  --     Estimated Creatinine Clearance: 30.3 mL/min (A) (by C-G formula based on SCr of 1.53 mg/dL (H)).   Medical History: Past Medical History:  Diagnosis Date  . AAA (abdominal aortic aneurysm) ([place])    "we've known about it since ~ 2000"  . AAA (abdominal aortic aneurysm) ([place]) 06/03/2016  . Arthritis    in back and hands:  Gout  . Atrial fibrillation ([place])    a. 04/2011 post op  . BPH (benign prostatic hyperplasia)   . Bronchitis    hx of; "once or twice in my life" (04/21/11)  . Cancer ([place])    Basal Cell carcinoma- per Dr Jacquiline Doe office notes  . Carotid artery disease ([place])   . Chronic kidney disease    stage 3 - per Dr Jacquiline Doe office notes  . Chronic lower back pain 1995  . Eczema   . GERD (gastroesophageal reflux disease)   . Gout of big toe    "h/o in both"  . Hiatal hernia   . History of kidney stones   . Hypertension    Takes lisinopril/hctz  . Lumbar spondylosis   . Lumbar stenosis    a. s/p  anterolateral decompression and lateral plating 04/21/2011  . Migraine    "left ~ 1980's"  . Stomach ulcer    "years ago; treated w/diet"    Assessment: 27 YOM presented to ED with constant, stabbing chest pain. Patient denies SOB, N/V, dizziness, and diaphoresis. Pharmacy consulted to start heparin for possible ACS. Hgb slight trend down this AM, but no overt bleeding or  complications noted.  Heparin level at goal.  Awaiting possible cath tomorrow.  Goal of Therapy:  Heparin level 0.3-0.7 units/ml Monitor platelets by anticoagulation protocol: Yes   Plan:  Continue IV heparin at 850 units/hr. Recheck heparin level in 8 hrs to confirm. Daily heparin level and CBC. F/u plans for possible cath tomorrow.  Marguerite Olea, [facility] Clinical Pharmacist Phone [phone]  11/21/2017 8:19 AM

## 2017-11-21 NOTE — Progress Notes (Signed)
  Echocardiogram 2D Echocardiogram has been performed.  Jennette Dubin 11/21/2017, 10:38 AM

## 2017-11-21 NOTE — Progress Notes (Addendum)
Subjective:  Denies chest pain  PVC's and ventricular bigeminy on monitor, improved now  Objective:  Vital Signs in the last 24 hours: Temp:  [97.7 F (36.5 C)-98.4 F (36.9 C)] 97.9 F (36.6 C) (10/13 0738) Pulse Rate:  [53-105] 89 (10/13 0935) Resp:  [11-22] 17 (10/13 0935) BP: (110-185)/(63-101) 132/63 (10/13 0935) SpO2:  [95 %-100 %] 97 % (10/13 0935) Weight:  [69.5 kg-70.7 kg] 69.5 kg (10/13 0049)  Intake/Output from previous day: 10/12 0701 - 10/13 0700 In: 123.4 [I.V.:123.4] Out: 120 [Urine:120] Intake/Output from this shift: Total I/O In: 240 [P.O.:240] Out: -   Physical Exam: Nursing note and vitals reviewed. Constitutional: He is oriented to person, place, and time. He appears well-developed and well-nourished. No distress.  Appears much younger than stated age  HENT:  Head: Normocephalic and atraumatic.  Eyes: Conjunctivae are normal.  Neck: No JVD present.  Cardiovascular: Normal rate, regular rhythm and normal heart sounds.  No murmur heard. Pulses:      Carotid pulses are on the right side with bruit, and on the left side with bruit.      Radial pulses are 2+ on the right side, and 2+ on the left side.       Femoral pulses are 3+ on the right side, and 2+ on the left side.      Dorsalis pedis pulses are 1+ on the right side, and 1+ on the left side.       Posterior tibial pulses are 0 on the right side, and 1+ on the left side.  Respiratory: Effort normal and breath sounds normal. He has no wheezes. He has no rales.  GI: Soft. Bowel sounds are normal. There is no tenderness. There is no rebound.  Musculoskeletal: He exhibits no edema.  Lymphadenopathy:    He has no cervical adenopathy.  Neurological: He is alert and oriented to person, place, and time. No cranial nerve deficit.  Skin: Skin is warm and dry.  Warty mole X 2 on the back   Psychiatric: He has a normal mood and affect  Lab Results: Recent Labs    11/20/17 1834 11/21/17 0652  WBC 7.1  6.3  HGB 14.9 13.3  PLT 250 196   Recent Labs    11/20/17 1834 11/21/17 0937  NA 139 138  K 3.4* 3.9  CL 102 108  CO2 23 21*  GLUCOSE 183* 196*  BUN 41* 35*  CREATININE 1.53* 1.24   Recent Labs    11/21/17 0937  TROPONINI 8.36*   Hepatic Function Panel No results for input(s): PROT, ALBUMIN, AST, ALT, ALKPHOS, BILITOT, BILIDIR, IBILI in the last 72 hours. Recent Labs    11/21/17 0652  CHOL 180   Cardiac studies:  EKG 11/20/2017: Sinus tachycardia, normal axis, normal conduction. Left atrial enlargement. Inferolateral ST depression. [facility] echocardiogram 11/21/2017: - Left ventricle: The cavity size was normal. There was moderate   concentric hypertrophy. Systolic function was normal. The   estimated ejection fraction was in the range of 55% to 60%. [place]   motion was normal; there were no regional Rickels motion   abnormalities. Doppler parameters are consistent with abnormal   left ventricular relaxation (grade 1 diastolic dysfunction). - Aortic valve: Moderately calcified annulus. Trileaflet; mildly   calcified leaflets. There was no significant regurgitation.   Inadequate Doppler evaluation to assess stenosis. Visually, does   not appear significant. - Mitral valve: The findings are consistent with mild calcific   stenosis. mean PG 4 mmHg, MVA  1.8 cm2 at HR 74 bpm. Valve area by   pressure half-time: 1.86 cm^2. - Tricuspid valve: There was mild regurgitation. - Pulmonary arteries: PA peak pressure: 29 mm Hg (S).  Assessment:  84 y/o male with PAD (b/l mod carotid stenosis), s/p endovascular repair of 5.5 cm infrarenal abdominal aorta aneurysm 06/03/2016, hypertension, h/o postop Afib in 2013 with no reported recurrence, being admitted with NSTEMI  NSTEMI, peak trop 8.3 ng/mL Hypertension Mild calcific mitral stenosis, aortic sclerosis. Preserved LVEF. B/l moderate carotid artery disease S/p endovascular repair of infrarenal AAA (2018) CKD  2, possible AKI resolved H/o post op Afib 2013  Plan: Admit to telemetry. ACS dose heparin. Start aspirin with Plavix without loading pending coronary angiogram. Metoprolol 25 mg twice a day. Rosuvastatin 20 mg daily. Resume lisinopril-HCTZ 20-12.5, once daily (Twice daily at home) IV hydration in preparation for coronary angiogram, on 11/22/2017.   LOS: 1 day    Tiron Suski J Shannan Slinker 11/21/2017, 11:29 AM  [place], MD [facility]. [place] Pager: [phone] Office: [phone] If no answer Cell [phone]

## 2017-11-22 ENCOUNTER — Encounter (HOSPITAL_COMMUNITY): Payer: Self-pay | Admitting: Cardiology

## 2017-11-22 ENCOUNTER — Encounter (HOSPITAL_COMMUNITY)
Admission: EM | Disposition: A | Discharge status: Home Health | Payer: Self-pay | Source: Home / Self Care | Attending: Cardiology

## 2017-11-22 DIAGNOSIS — F0390 Unspecified dementia without behavioral disturbance: Secondary | ICD-10-CM

## 2017-11-22 DIAGNOSIS — I25110 Atherosclerotic heart disease of native coronary artery with unstable angina pectoris: Secondary | ICD-10-CM

## 2017-11-22 HISTORY — PX: LEFT HEART CATH AND CORONARY ANGIOGRAPHY: CATH118249

## 2017-11-22 LAB — BASIC METABOLIC PANEL
Anion gap: 9 (ref 5–15)
BUN: 30 mg/dL — AB (ref 8–23)
CALCIUM: 9.1 mg/dL (ref 8.9–10.3)
CO2: 22 mmol/L (ref 22–32)
CREATININE: 1.2 mg/dL (ref 0.61–1.24)
Chloride: 109 mmol/L (ref 98–111)
GFR calc non Af Amer: 51 mL/min — ABNORMAL LOW (ref >60)
GFR, EST AFRICAN AMERICAN: 59 mL/min — AB (ref >60)
Glucose, Bld: 114 mg/dL — ABNORMAL HIGH (ref 70–99)
Potassium: 4 mmol/L (ref 3.5–5.1)
Sodium: 140 mmol/L (ref 135–145)

## 2017-11-22 LAB — CBC
HEMATOCRIT: 42.8 % (ref 39.0–52.0)
Hemoglobin: 13.8 g/dL (ref 13.0–17.0)
MCH: 31.7 pg (ref 26.0–34.0)
MCHC: 32.2 g/dL (ref 30.0–36.0)
MCV: 98.2 fL (ref 80.0–100.0)
Platelets: 203 10*3/uL (ref 150–400)
RBC: 4.36 MIL/uL (ref 4.22–5.81)
RDW: 12.1 % (ref 11.5–15.5)
WBC: 6.6 10*3/uL (ref 4.0–10.5)
nRBC: 0 % (ref 0.0–0.2)

## 2017-11-22 LAB — HEPARIN LEVEL (UNFRACTIONATED): HEPARIN UNFRACTIONATED: 0.27 [IU]/mL — AB (ref 0.30–0.70)

## 2017-11-22 SURGERY — LEFT HEART CATH AND CORONARY ANGIOGRAPHY
Anesthesia: LOCAL

## 2017-11-22 MED ORDER — VERAPAMIL HCL 2.5 MG/ML IV SOLN
INTRAVENOUS | Status: AC
  Filled 2017-11-22: qty 2

## 2017-11-22 MED ORDER — HEPARIN SODIUM (PORCINE) 1000 UNIT/ML IJ SOLN
INTRAMUSCULAR | Status: AC
  Filled 2017-11-22: qty 1

## 2017-11-22 MED ORDER — HEPARIN (PORCINE) IN NACL 1000-0.9 UT/500ML-% IV SOLN
INTRAVENOUS | Status: DC | PRN
  Administered 2017-11-22 (×2): 500 mL

## 2017-11-22 MED ORDER — FENTANYL CITRATE (PF) 100 MCG/2ML IJ SOLN
INTRAMUSCULAR | Status: AC
  Filled 2017-11-22: qty 2

## 2017-11-22 MED ORDER — HEPARIN (PORCINE) IN NACL 1000-0.9 UT/500ML-% IV SOLN
INTRAVENOUS | Status: AC
  Filled 2017-11-22: qty 500

## 2017-11-22 MED ORDER — SODIUM CHLORIDE 0.9% FLUSH: 3.0000 mL | INTRAVENOUS | Status: DC | PRN

## 2017-11-22 MED ORDER — SODIUM CHLORIDE 0.9 % IV SOLN: INTRAVENOUS | Status: AC

## 2017-11-22 MED ORDER — LIDOCAINE HCL (PF) 1 % IJ SOLN
INTRAMUSCULAR | Status: DC | PRN
  Administered 2017-11-22: 2 mL

## 2017-11-22 MED ORDER — SODIUM CHLORIDE 0.9% FLUSH
3.0000 mL | Freq: Two times a day (BID) | INTRAVENOUS | Status: DC
  Administered 2017-11-23: 3 mL via INTRAVENOUS

## 2017-11-22 MED ORDER — MIDAZOLAM HCL 2 MG/2ML IJ SOLN
INTRAMUSCULAR | Status: AC
  Filled 2017-11-22: qty 2

## 2017-11-22 MED ORDER — HEPARIN (PORCINE) IN NACL 100-0.45 UNIT/ML-% IJ SOLN
950.0000 [IU]/h | INTRAMUSCULAR | Status: DC
  Administered 2017-11-22: 950 [IU]/h via INTRAVENOUS
  Filled 2017-11-22: qty 250

## 2017-11-22 MED ORDER — IOHEXOL 350 MG/ML SOLN
INTRAVENOUS | Status: DC | PRN
  Administered 2017-11-22: 30 mL via INTRA_ARTERIAL

## 2017-11-22 MED ORDER — ONDANSETRON HCL 4 MG/2ML IJ SOLN: 4.0000 mg | Freq: Four times a day (QID) | INTRAMUSCULAR | Status: DC | PRN

## 2017-11-22 MED ORDER — VERAPAMIL HCL 2.5 MG/ML IV SOLN
INTRAVENOUS | Status: DC | PRN
  Administered 2017-11-22 (×2): 5 mL via INTRA_ARTERIAL

## 2017-11-22 MED ORDER — HEPARIN SODIUM (PORCINE) 1000 UNIT/ML IJ SOLN
INTRAMUSCULAR | Status: DC | PRN
  Administered 2017-11-22: 4000 [IU] via INTRAVENOUS

## 2017-11-22 MED ORDER — ACETAMINOPHEN 325 MG PO TABS: 650.0000 mg | ORAL_TABLET | ORAL | Status: DC | PRN

## 2017-11-22 MED ORDER — SODIUM CHLORIDE 0.9 % IV SOLN
250.0000 mL | INTRAVENOUS | Status: DC | PRN
  Administered 2017-11-22: 250 mL via INTRAVENOUS

## 2017-11-22 MED ORDER — MIDAZOLAM HCL 2 MG/2ML IJ SOLN
INTRAMUSCULAR | Status: DC | PRN
  Administered 2017-11-22: 0.5 mg via INTRAVENOUS

## 2017-11-22 MED ORDER — FENTANYL CITRATE (PF) 100 MCG/2ML IJ SOLN
INTRAMUSCULAR | Status: DC | PRN
  Administered 2017-11-22: 12.5 ug via INTRAVENOUS

## 2017-11-22 MED ORDER — LIDOCAINE HCL (PF) 1 % IJ SOLN
INTRAMUSCULAR | Status: AC
  Filled 2017-11-22: qty 30

## 2017-11-22 SURGICAL SUPPLY — 9 items
CATH 5FR JL3.5 JR4 ANG PIG MP (CATHETERS) ×2 IMPLANT
DEVICE RAD COMP TR BAND LRG (VASCULAR PRODUCTS) ×1 IMPLANT
GLIDESHEATH SLEND A-KIT 6F 22G (SHEATH) ×1 IMPLANT
GUIDEWIRE INQWIRE 1.5J.035X260 (WIRE) IMPLANT
INQWIRE 1.5J .035X260CM (WIRE) ×2
KIT HEART LEFT (KITS) ×2 IMPLANT
PACK CARDIAC CATHETERIZATION (CUSTOM PROCEDURE TRAY) ×2 IMPLANT
TRANSDUCER W/STOPCOCK (MISCELLANEOUS) ×2 IMPLANT
TUBING CIL FLEX 10 FLL-RA (TUBING) ×2 IMPLANT

## 2017-11-22 NOTE — Progress Notes (Signed)
[place] for heparin Indication: chest pain/ACS  Allergies  Allergen Reactions  . Demerol Anaphylaxis    Patient Measurements: Height: 5\' 10"  (177.8 cm) Weight: 151 lb 7.3 oz (68.7 kg) IBW/kg (Calculated) : 73 Heparin Dosing Weight: 70.7 kg  Vital Signs: Temp: 98 F (36.7 C) (10/14 0400) Temp Source: Oral (10/14 0400) BP: 150/78 (10/14 0810) Pulse Rate: 78 (10/14 0810)  Labs: Recent Labs    11/20/17 1834 11/21/17 0652 11/21/17 0937 11/21/17 1604 11/22/17 0149  HGB 14.9 13.3  --   --  13.8  HCT 48.2 41.6  --   --  42.8  PLT 250 196  --   --  203  HEPARINUNFRC  --  0.36  --  0.31 0.27*  CREATININE 1.53*  --  1.24  --  1.20  TROPONINI  --   --  8.36*  --   --     Estimated Creatinine Clearance: 38.2 mL/min (by C-G formula based on SCr of 1.2 mg/dL).   Medical History: Past Medical History:  Diagnosis Date  . AAA (abdominal aortic aneurysm) ([place])    "we've known about it since ~ 2000"  . Arthritis    in back and hands:  Gout  . Atrial fibrillation ([place])    a. 04/2011 post op  . BPH (benign prostatic hyperplasia)   . Bronchitis    hx of; "once or twice in my life" (04/21/11)  . Cancer ([place])    Basal Cell carcinoma- per Dr Jacquiline Doe office notes  . Carotid artery disease ([place])   . Chronic kidney disease    stage 3 - per Dr Jacquiline Doe office notes  . Chronic lower back pain 1995  . Eczema   . GERD (gastroesophageal reflux disease)   . Gout of big toe    "h/o in both"  . Hiatal hernia   . History of kidney stones   . Hypertension   . Lumbar spondylosis   . Lumbar stenosis    a. s/p  anterolateral decompression and lateral plating 04/21/2011  . Migraine    "left ~ 1980's"  . Stomach ulcer    "years ago; treated w/diet"    Assessment: 67 YOM presented to ED with constant, stabbing chest pain noted with NSTEMI.  He is s/p cath with multivessel CAD for possible CABG. Heparin to restart 8 hrs post sheath removal (removed  about 8am; radial approach) -Prior Heparin rate was 850 units/hr and heparin level was 0.27  Goal of Therapy:  Heparin level 0.3-0.7 units/ml Monitor platelets by anticoagulation protocol: Yes   Plan:  -Restart heparin at 950 units/hr at 4:30pm -Heparin level in 8 hours and daily wth CBC daily  Hildred Laser, PharmD Clinical Pharmacist Please check Amion for pharmacy contact number

## 2017-11-22 NOTE — Interval H&P Note (Signed)
History and Physical Interval Note:  11/22/2017 7:40 AM  Justin [name]  has presented today for surgery, with the diagnosis of NSTEMI  The various methods of treatment have been discussed with the patient and family. After consideration of risks, benefits and other options for treatment, the patient has consented to  Procedure(s): LEFT HEART CATH AND CORONARY ANGIOGRAPHY (N/A) as a surgical intervention .  The patient's history has been reviewed, patient examined, no change in status, stable for surgery.  I have reviewed the patient's chart and labs.  Questions were answered to the patient's satisfaction.    2016 Appropriate Use Criteria for Coronary Revascularization in Patients With Acute Coronary Syndrome NSTEMI/UA High Risk (TIMI Score 5-7) NSTEMI/Unstable angina, stabilized patient at high risk Link Here: sistemancia.com Indication:  Revascularization by PCI or CABG of 1 or more arteries in a patient with NSTEMI or unstable angina with Stabilization after presentation High risk for clinical events  A (7) Indication: 16; Score 7     [name]

## 2017-11-22 NOTE — Progress Notes (Addendum)
Subjective:  Denies chest pain  Objective:  Vital Signs in the last 24 hours: Temp:  [97.5 F (36.4 C)-98 F (36.7 C)] 97.6 F (36.4 C) (10/14 2000) Pulse Rate:  [64-79] 78 (10/14 0810) Resp:  [12-23] 23 (10/14 2000) BP: (92-156)/(55-84) 103/55 (10/14 2000) SpO2:  [94 %-100 %] 98 % (10/14 2000) Weight:  [68.7 kg] 68.7 kg (10/14 0326)  Intake/Output from previous day: 10/13 0701 - 10/14 0700 In: 626.5 [P.O.:540; I.V.:86.5] Out: 300 [Urine:300] Intake/Output from this shift: No intake/output data recorded.  Physical Exam: Nursing note and vitals reviewed. Constitutional: He is oriented to person, place, and time. He appears well-developed and well-nourished. No distress.  Appears much younger than stated age  HENT:  Head: Normocephalic and atraumatic.  Eyes: Conjunctivae are normal.  Neck: No JVD present.  Cardiovascular: Normal rate, regular rhythm and normal heart sounds.  No murmur heard. Pulses:      Carotid pulses are on the right side with bruit, and on the left side with bruit.      Radial pulses are 2+ on the right side, and 2+ on the left side.       Femoral pulses are 3+ on the right side, and 2+ on the left side.      Dorsalis pedis pulses are 1+ on the right side, and 1+ on the left side.       Posterior tibial pulses are 0 on the right side, and 1+ on the left side.  Respiratory: Effort normal and breath sounds normal. He has no wheezes. He has no rales.  GI: Soft. Bowel sounds are normal. There is no tenderness. There is no rebound.  Musculoskeletal: He exhibits no edema.  Lymphadenopathy:    He has no cervical adenopathy.  Neurological: He is alert and oriented to person, place, and time. No cranial nerve deficit.  Skin: Skin is warm and dry.  Warty mole X 2 on the back   Psychiatric: He has a normal mood and affect  Lab Results: Recent Labs    11/21/17 0652 11/22/17 0149  WBC 6.3 6.6  HGB 13.3 13.8  PLT 196 203   Recent Labs    11/21/17 0937  11/22/17 0149  NA 138 140  K 3.9 4.0  CL 108 109  CO2 21* 22  GLUCOSE 196* 114*  BUN 35* 30*  CREATININE 1.24 1.20   Recent Labs    11/21/17 0937  TROPONINI 8.36*   Hepatic Function Panel No results for input(s): PROT, ALBUMIN, AST, ALT, ALKPHOS, BILITOT, BILIDIR, IBILI in the last 72 hours. Recent Labs    11/21/17 0652  CHOL 180   Cardiac studies:  EKG 11/20/2017: Sinus tachycardia, normal axis, normal conduction. Left atrial enlargement. Inferolateral ST depression. [facility] echocardiogram 11/21/2017: - Left ventricle: The cavity size was normal. There was moderate   concentric hypertrophy. Systolic function was normal. The   estimated ejection fraction was in the range of 55% to 60%. [place]   motion was normal; there were no regional Henshaw motion   abnormalities. Doppler parameters are consistent with abnormal   left ventricular relaxation (grade 1 diastolic dysfunction). - Aortic valve: Moderately calcified annulus. Trileaflet; mildly   calcified leaflets. There was no significant regurgitation.   Inadequate Doppler evaluation to assess stenosis. Visually, does   not appear significant. - Mitral valve: The findings are consistent with mild calcific   stenosis. mean PG 4 mmHg, MVA 1.8 cm2 at HR 74 bpm. Valve area by   pressure half-time:  1.86 cm^2. - Tricuspid valve: There was mild regurgitation. - Pulmonary arteries: PA peak pressure: 29 mm Hg (S).  Cath 11/22/2017: LM: Distal LM bifurcation has severely calcified 95% stenosis LAD: Large diag 1 with ostial 95% stenosis          LAD/Diag 2 bifurcation 90% stenosis. Mid diag 2 60% diserase          Rest of the LAD looks fairly normal with good surgical targets on LAD as well as diag vessels LCx: Prox LCx tortuous with 70% disease          Moderate sized OM 1 with long 95% stenosis with good surgical targets  RCA: Prox-mid RCA 50% irregular disease  Normal LVEDP  Severely multivessel  CAD   Assessment:  84 y/o male with PAD (b/l mod carotid stenosis), s/p endovascular repair of 5.5 cm infrarenal abdominal aorta aneurysm 06/03/2016, hypertension, h/o postop Afib in 2013 with no reported recurrence,  being admitted with NSTEMI  NSTEMI, peak trop 8.3 ng/mL Severe multivessel CAD, including distal LM bifurcation calcific stenosis: Not a surgical candidate. Appreciate Dr. Lucianne Lei Trigt's consult. Hypertension Mild calcific mitral stenosis, aortic sclerosis. Preserved LVEF. B/l moderate carotid artery disease S/p endovascular repair of infrarenal AAA (2018) CKD 2, possible AKI resolved H/o post op Afib 2013  Plan: ACS dose heparin. Continue aspirin, plavix. Metoprolol 25 mg twice a day. Rosuvastatin 20 mg daily. Continue lisinopril-HCTZ 20-12.5, once daily (Twice daily at home)  I had a long discussion with the patient and his family including wife, two sons, daughter in law and granddaughter. He is not a surgical candidate due to advanced age and dementia. Both would also increase his risk for high risk PCI that would require Impella support. Family concerned about post PCI care and his worsening dementia. Moreover, his functional capacity is limited due to back pain, which is probably the reason he has not had angina. As such, PCI would not make significant change to his quality of life. He is at increased risk of recurrent MI. I have explained to the family that without revascularization, future treatment for any MI would remain medical. I have encouraged them to discuss his overall goals of care, including code status and advanced directive.  Recommend hospital stay for another 1-2 days for continued medical management.   I spent 90 min discussing all options, as above, and coordinating care and answering many questions that the patient and family had.    LOS: 2 days    Namrata Dangler J Dajohn Ellender 11/22/2017, 10:02 PM  [place], MD [facility].  [place] Pager: [phone] Office: [phone] If no answer Cell [phone]

## 2017-11-22 NOTE — Consult Note (Addendum)
[address]       [place], [address]             [phone]        Justin [name] [place] Medical Record #[id number] Date of Birth: [birth year]  Referring: Patwhardhan Primary Care: Burnard Bunting, MD Primary Cardiologist:No primary care provider on file.  Chief Complaint:    Chief Complaint  Patient presents with  . Chest Pain    History of Present Illness:     Justin [name] is a 84 yo white male with known history of Dementia, Carotid Artery Stenosis, S/P Repair of Abdominal Aneurysm with stent graft by Dr. Trula Slade in 9528, complicated by post operative Atrial Fibrillation, and, multiple back surgeries.  He presented to the ED on 10/12 with complaints of left sided chest pain that developed on his way home from dinner.  Upon arrival to the ED the patient was chest pain free.  Workup showed elevated Troponin level and mild ischemic changes on his EKG.  He was ruled in for NSTEMI and admitted for further care.  He remained chest pain free during hospitalization.  The patient underwent cardiac catheterization today and was found to have multivessel CAD.  It was felt the patient would benefit from coronary bypass grafting as PCI would be difficult and Cardiothoracic surgery consult was requested.  Currently the patient is chest pain free.  He is accompanied by his wife, daughter in law, and son.  The patient is agitated.  His wife admits his dementia has been getting worse.  His daughter in law states that when they left the room last night, he pulled a line out.  The patient used to be active, however the son states the patient has been spending a lot of time on the couch lately as he has been feeling poorly.  His wife is concerned about taking care of the patient post operatively, and knows that she will likely not be able to provide full time care and will need assistance.  He denies smoking use.  Current Activity/ Functional Status: Patient is not independent with  mobility/ambulation, transfers, ADL's, IADL's.   Zubrod Score: At the time of surgery this patient's most appropriate activity status/level should be described as: []     0    Normal activity, no symptoms []     1    Restricted in physical strenuous activity but ambulatory, able to do out light work [x]     2    Ambulatory and capable of self care, unable to do work activities, up and about                 more than 50%  Of the time                            []     3    Only limited self care, in bed greater than 50% of waking hours []     4    Completely disabled, no self care, confined to bed or chair []     5    Moribund  Past Medical History:  Diagnosis Date  . AAA (abdominal aortic aneurysm) ([place])    "we've known about it since ~ 2000"  . Arthritis    in back and hands:  Gout  . Atrial fibrillation ([place])    a. 04/2011 post op  . BPH (benign prostatic hyperplasia)   . Bronchitis  hx of; "once or twice in my life" (04/21/11)  . Cancer ([place])    Basal Cell carcinoma- per Dr Jacquiline Doe office notes  . Carotid artery disease ([place])   . Chronic kidney disease    stage 3 - per Dr Jacquiline Doe office notes  . Chronic lower back pain 1995  . Eczema   . GERD (gastroesophageal reflux disease)   . Gout of big toe    "h/o in both"  . Hiatal hernia   . History of kidney stones   . Hypertension   . Lumbar spondylosis   . Lumbar stenosis    a. s/p  anterolateral decompression and lateral plating 04/21/2011  . Migraine    "left ~ 1980's"  . Stomach ulcer    "years ago; treated w/diet"     Past Surgical History:  Procedure Laterality Date  . ABDOMINAL AORTA STENT  06/03/2016   ABDOMINAL AORTIC ENDOVASCULAR STENT GRAFT insertion (N/A)  . ABDOMINAL AORTIC ENDOVASCULAR STENT GRAFT N/A 06/03/2016   Procedure: ABDOMINAL AORTIC ENDOVASCULAR STENT GRAFT insertion;  Surgeon: Serafina Mitchell, MD;  Location: [facility];  Service: Vascular;  Laterality: N/A;  . ANTERIOR LAT LUMBAR FUSION  04/21/11   w/PEEK  cage, allograft, and lateral plate (r)  . ANTERIOR LAT LUMBAR FUSION  04/21/2011   Procedure: ANTERIOR LATERAL LUMBAR FUSION 1 LEVEL;  Surgeon: Erline Levine, MD;  Location: [facility];  Service: Neurosurgery;  Laterality: Right;  RIGHT Lumbar three-four anterolateral decompression with fusion and lateral plating  . APPENDECTOMY  1946  . BACK SURGERY  04/21/11   "today makes the 5th time"  . CATARACT EXTRACTION, BILATERAL    . EYE SURGERY Bilateral    Cataract  . LEFT HEART CATH AND CORONARY ANGIOGRAPHY N/A 11/22/2017   Procedure: LEFT HEART CATH AND CORONARY ANGIOGRAPHY;  Surgeon: Nigel Mormon, MD;  Location: [facility];  Service: Cardiovascular;  Laterality: N/A;  . PROSTATE SURGERY    . SPINE SURGERY     X's 5   . TONSILLECTOMY     "as a child" and Adneoids    Social History   Tobacco Use  Smoking Status Former Smoker  . Packs/day: 1.00  . Years: 20.00  . Pack years: 20.00  . Types: Cigarettes  . Last attempt to quit: 02/10/1980  . Years since quitting: 37.8  Smokeless Tobacco Never Used    Social History   Substance and Sexual Activity  Alcohol Use No     Allergies  Allergen Reactions  . Demerol Anaphylaxis    Current Facility-Administered Medications  Medication Dose Route Frequency Provider Last Rate Last Dose  . 0.9 %  sodium chloride infusion   Intravenous Continuous Patwardhan, Manish J, MD      . 0.9 %  sodium chloride infusion  250 mL Intravenous PRN Patwardhan, Manish J, MD      . acetaminophen (TYLENOL) tablet 650 mg  650 mg Oral Q4H PRN Patwardhan, Manish J, MD      . aspirin EC tablet 81 mg  81 mg Oral Daily Patwardhan, Manish J, MD   81 mg at 11/22/17 0954  . bisacodyl (DULCOLAX) EC tablet 5 mg  5 mg Oral Daily PRN Patwardhan, Manish J, MD      . donepezil (ARICEPT) tablet 5 mg  5 mg Oral Daily Patwardhan, Manish J, MD   5 mg at 11/22/17 0954  . dorzolamide-timolol (COSOPT) 22.3-6.8 MG/ML ophthalmic solution 1 drop  1 drop Both Eyes BID  Patwardhan, Reynold Bowen, MD  1 drop at 11/22/17 0956  . dutasteride (AVODART) capsule 0.5 mg  0.5 mg Oral Daily Patwardhan, Manish J, MD   0.5 mg at 11/22/17 0955  . heparin ADULT infusion 100 units/mL (25000 units/214mL sodium chloride 0.45%)  950 Units/hr Intravenous Continuous Kris Mouton, RPH      . hydrochlorothiazide (MICROZIDE) capsule 12.5 mg  12.5 mg Oral Daily Patwardhan, Manish J, MD   12.5 mg at 11/22/17 0954  . Influenza vac split quadrivalent PF (FLUZONE HIGH-DOSE) injection 0.5 mL  0.5 mL Intramuscular Tomorrow-1000 Patwardhan, Manish J, MD      . lisinopril (PRINIVIL,ZESTRIL) tablet 20 mg  20 mg Oral Daily Patwardhan, Manish J, MD   20 mg at 11/22/17 0954  . metoprolol tartrate (LOPRESSOR) tablet 25 mg  25 mg Oral BID Nigel Mormon, MD   25 mg at 11/22/17 0954  . nitroGLYCERIN (NITROSTAT) SL tablet 0.4 mg  0.4 mg Sublingual Q5 Min x 3 PRN Patwardhan, Manish J, MD      . ondansetron (ZOFRAN) injection 4 mg  4 mg Intravenous Q6H PRN Patwardhan, Manish J, MD      . rosuvastatin (CRESTOR) tablet 10 mg  10 mg Oral q1800 Patwardhan, Manish J, MD   10 mg at 11/21/17 1757  . sodium chloride flush (NS) 0.9 % injection 3 mL  3 mL Intravenous Q12H Patwardhan, Manish J, MD      . sodium chloride flush (NS) 0.9 % injection 3 mL  3 mL Intravenous PRN Patwardhan, Manish J, MD        Medications Prior to Admission  Medication Sig Dispense Refill Last Dose  . acetaminophen (TYLENOL) 500 MG tablet Take 1,000 mg by mouth daily as needed (back pain).   11/19/2017 at Unknown time  . aspirin EC 81 MG tablet Take 81 mg by mouth daily.    11/20/2017 at Unknown time  . brimonidine (ALPHAGAN) 0.2 % ophthalmic solution Place 1 drop into both eyes 2 (two) times daily.    11/20/2017 at Unknown time  . donepezil (ARICEPT) 5 MG tablet Take 5 mg by mouth daily.  4 11/20/2017 at Unknown time  . dorzolamide-timolol (COSOPT) 22.3-6.8 MG/ML ophthalmic solution Place 1 drop into both eyes 2 (two) times  daily.    11/20/2017 at 08:00  . dutasteride (AVODART) 0.5 MG capsule Take 0.5 mg by mouth daily.    11/20/2017 at Unknown time  . lisinopril-hydrochlorothiazide (PRINZIDE,ZESTORETIC) 20-12.5 MG per tablet Take 1 tablet by mouth 2 (two) times daily.    11/20/2017 at Unknown time  . LUMIGAN 0.01 % SOLN Place 1 drop into both eyes every other day.   3 11/20/2017 at Unknown time  . traMADol (ULTRAM) 50 MG tablet Take 50 mg by mouth daily.  0 11/20/2017 at Unknown time  . TRAVATAN Z 0.004 % SOLN ophthalmic solution Place 1 drop into the left eye See admin instructions. On odd days   11/19/2017 at Unknown time  . White Petrolatum-Mineral Oil (LUBRICANT EYE OP) Place 1 drop into both eyes daily as needed (dry eye).   11/20/2017 at Unknown time  . bisacodyl (DULCOLAX) 5 MG EC tablet Take 1 tablet (5 mg total) by mouth daily as needed for moderate constipation. (Patient not taking: Reported on 07/20/2017) 30 tablet 0 Not Taking    Family History  Problem Relation Age of Onset  . Alzheimer's disease Mother        died @ 11  . COPD Father        died @  40  . Cancer Father   . Hypertension Father   . Heart disease Father   . Stroke Sister   . Cancer Brother        stomach  . Heart disease Brother        Aneurysm  . Hypertension Son   . Heart disease Son        Aneurysm  . Hypertension Son   . Heart disease Son        Aneurysm  . Anesthesia problems Neg Hx   . Hypotension Neg Hx   . Malignant hyperthermia Neg Hx   . Pseudochol deficiency Neg Hx    Review of Systems:   ROS Constitutional: agitated, seems forgetful, per son has been feeling poorly for a while Eyes: negative Respiratory: negative Cardiovascular: positive for chest pain and has resolved since admission Gastrointestinal: negative Neurological: Dementia, per family has been getting worse, wife states he gets agitated with her at times    Cardiac Review of Systems: Y or  [    ]= no  Chest Pain [ Y   ]  Resting SOB [ N   ] Exertional SOB  [  ]  Orthopnea [  ]   Pedal Edema [   ]    Palpitations [  ] Syncope  [  ]   Presyncope [   ]  General Review of Systems: [Y] = yes [  ]=no Constitional: recent weight change [ N ]; anorexia [  ]; fatigue [  ]; nausea [N  ]; night sweats [  ]; fever [  ]; or chills [  ]                                                               Dental: Last Dentist visit:   Eye : blurred vision [  ]; diplopia [   ]; vision changes [ N ];  Amaurosis fugax[  ]; Resp: cough [  ];  wheezing[  ];  hemoptysis[  ]; shortness of breath[N  ]; paroxysmal nocturnal dyspnea[  ]; dyspnea on exertion[  ]; or orthopnea[  ];  GI:  gallstones[  ], vomiting[N  ];  dysphagia[  ]; melena[  ];  hematochezia [  ]; heartburn[  ];   Hx of  Colonoscopy[  ]; GU: kidney stones [  ]; hematuria[  ];   dysuria [  ];  nocturia[  ];  history of     obstruction [  ]; urinary frequency [  ]             Skin: rash, swelling[  ];, hair loss[  ];  peripheral edema[  ];  or itching[  ]; Musculosketetal: myalgias[  ];  joint swelling[  ];  joint erythema[  ];  joint pain[  ];  back pain[  ];  Heme/Lymph: bruising[  ];  bleeding[  ];  anemia[  ];  Neuro: TIA[  ];  headaches[  ];  stroke[  ];  vertigo[  ];  seizures[  ];   paresthesias[  ];  difficulty walking[  ];  Psych:depression[N  ]; anxiety[  ];  Endocrine: diabetes[  ];  thyroid dysfunction[  ];  Physical Exam: BP 92/62 (BP Location: Left Arm)   Pulse 78   Temp Marland Kitchen)  97.5 F (36.4 C) (Oral)   Resp (!) 23   Ht 5\' 10"  (1.778 m)   Wt 68.7 kg   SpO2 98%   BMI 21.73 kg/m    General appearance: alert, no distress and agitated during evaluation Head: Normocephalic, without obvious abnormality, atraumatic Neck: + carotid bruit Resp: clear to auscultation bilaterally Cardio: regular rate and rhythm GI: soft, non-tender; bowel sounds normal; no masses,  no organomegaly Extremities: extremities normal, atraumatic, no cyanosis or edema Neurologic: Grossly normal, patient  is agitated, gets easily confused, as patient's family has to constantly reorient the patient during stay  Diagnostic Studies & Laboratory data:     Recent Radiology Findings:   Dg Chest 2 View  Result Date: 11/20/2017 CLINICAL DATA:  Acute onset chest pain 1 hour ago. EXAM: CHEST - 2 VIEW COMPARISON:  06/03/2016 FINDINGS: The heart size and mediastinal contours are within normal limits. Aortic atherosclerosis. Both lungs are clear. Pulmonary hyperinflation again seen, suspicious for COPD. Mild thoracic spine degenerative changes noted. IMPRESSION: Suspect COPD.  No active cardiopulmonary disease. Electronically Signed   By: Earle Gell M.D.   On: 11/20/2017 19:42     I have independently reviewed the above radiologic studies and discussed with the patient   Recent Lab Findings: Lab Results  Component Value Date   WBC 6.6 11/22/2017   HGB 13.8 11/22/2017   HCT 42.8 11/22/2017   PLT 203 11/22/2017   GLUCOSE 114 (H) 11/22/2017   CHOL 180 11/21/2017   TRIG 148 11/21/2017   HDL 38 (L) 11/21/2017   LDLCALC 112 (H) 11/21/2017   ALT 18 06/01/2016   AST 19 06/01/2016   NA 140 11/22/2017   K 4.0 11/22/2017   CL 109 11/22/2017   CREATININE 1.20 11/22/2017   BUN 30 (H) 11/22/2017   CO2 22 11/22/2017   TSH 4.189 04/22/2011   INR 1.21 06/03/2016   HGBA1C 6.0 (H) 11/21/2017   Assessment / Plan:      1. CAD- requesting coronary bypass- patient is advanced age at 84 years old.  He has dementia which has been increasing recently.  Surgery would likely worsen these symptoms.  Per the patient's son he has mostly been spending time on the couch due to feeling poorly.  Wife does have concern with care post operatively, as she is fearful she will not be able to provided.  He would likely require SNF placement 2. Dispo- patient is chest pain free, if he is a candidate for bypass surgery, he would be increased risk due to dementia and increased age.  Spoke with patient and family at length regarding  expected recovery timing and requirements for increased family assistance post operatively and possible worsening dementia symptoms.  Dr. Prescott Gum to evaluate and make further recommendations in regards to patient's candidacy for surgery, vs high risk PCI  I  spent 60 minutes counseling the patient face to face.   Erin Barrett PA-C 11/22/2017 1:42 PM Agree with above assessment by E Barrett PA-C Patient examined and cath , echo images reviewed 84 yo fragile male with confusion and memory deficit [ dementia] found to have severe CAD. He would do poorly with CAB and I have recommended PCI/ medical therapy. The patients family agree

## 2017-11-23 ENCOUNTER — Encounter (HOSPITAL_COMMUNITY)
Admission: EM | Disposition: A | Discharge status: Home Health | Payer: Self-pay | Source: Home / Self Care | Attending: Cardiology

## 2017-11-23 LAB — BASIC METABOLIC PANEL
ANION GAP: 9 (ref 5–15)
BUN: 29 mg/dL — AB (ref 8–23)
CALCIUM: 8.9 mg/dL (ref 8.9–10.3)
CO2: 21 mmol/L — ABNORMAL LOW (ref 22–32)
Chloride: 108 mmol/L (ref 98–111)
Creatinine, Ser: 1.2 mg/dL (ref 0.61–1.24)
GFR calc Af Amer: 59 mL/min — ABNORMAL LOW (ref >60)
GFR, EST NON AFRICAN AMERICAN: 51 mL/min — AB (ref >60)
GLUCOSE: 101 mg/dL — AB (ref 70–99)
Potassium: 3.8 mmol/L (ref 3.5–5.1)
SODIUM: 138 mmol/L (ref 135–145)

## 2017-11-23 LAB — CBC
HCT: 37.7 % — ABNORMAL LOW (ref 39.0–52.0)
Hemoglobin: 12.4 g/dL — ABNORMAL LOW (ref 13.0–17.0)
MCH: 31.8 pg (ref 26.0–34.0)
MCHC: 32.9 g/dL (ref 30.0–36.0)
MCV: 96.7 fL (ref 80.0–100.0)
NRBC: 0 % (ref 0.0–0.2)
PLATELETS: 191 10*3/uL (ref 150–400)
RBC: 3.9 MIL/uL — AB (ref 4.22–5.81)
RDW: 12.1 % (ref 11.5–15.5)
WBC: 6.2 10*3/uL (ref 4.0–10.5)

## 2017-11-23 LAB — HEPARIN LEVEL (UNFRACTIONATED): HEPARIN UNFRACTIONATED: 0.34 [IU]/mL (ref 0.30–0.70)

## 2017-11-23 SURGERY — CORONARY ATHERECTOMY
Anesthesia: LOCAL

## 2017-11-23 MED ORDER — METOPROLOL SUCCINATE ER 25 MG PO TB24: 25.0000 mg | ORAL_TABLET | Freq: Every day | ORAL | Status: DC

## 2017-11-23 MED ORDER — METOPROLOL SUCCINATE ER 25 MG PO TB24: 25.0000 mg | ORAL_TABLET | Freq: Every day | ORAL | 3 refills | Status: DC

## 2017-11-23 MED ORDER — CLOPIDOGREL BISULFATE 75 MG PO TABS: 75.0000 mg | ORAL_TABLET | Freq: Every day | ORAL | 3 refills | Status: DC

## 2017-11-23 MED ORDER — ROSUVASTATIN CALCIUM 10 MG PO TABS: 10.0000 mg | ORAL_TABLET | Freq: Every day | ORAL | 3 refills | Status: DC

## 2017-11-23 MED ORDER — NITROGLYCERIN 0.4 MG SL SUBL: 0.4000 mg | SUBLINGUAL_TABLET | SUBLINGUAL | 3 refills | Status: DC | PRN

## 2017-11-23 MED ORDER — ENOXAPARIN SODIUM 40 MG/0.4ML ~~LOC~~ SOLN
40.0000 mg | Freq: Every day | SUBCUTANEOUS | Status: DC
  Administered 2017-11-23: 40 mg via SUBCUTANEOUS
  Filled 2017-11-23: qty 0.4

## 2017-11-23 MED ORDER — CLOPIDOGREL BISULFATE 75 MG PO TABS
75.0000 mg | ORAL_TABLET | Freq: Every day | ORAL | Status: DC
  Administered 2017-11-24: 75 mg via ORAL
  Filled 2017-11-23: qty 1

## 2017-11-23 MED ORDER — LISINOPRIL-HYDROCHLOROTHIAZIDE 20-12.5 MG PO TABS: 1.0000 | ORAL_TABLET | Freq: Every day | ORAL | 3 refills | Status: DC

## 2017-11-23 NOTE — Care Management Note (Signed)
Case Management Note  Patient Details  Name: Justin [name] MRN: [id number] Date of Birth: 04-09-1923  Subjective/Objective:  NSTEMI                 Action/Plan: Patient lives at home with spouse; 2 sons supportive; [id number], Delfino Lovett, MD; has private insurance with Medicare; awaiting for Physical Therapy eval for disposition needs. CM will continue to follow for progression of care.  Expected Discharge Date:  11/22/17               Expected Discharge Plan:  [place] possibly  Discharge planning Services  CM Consult  Status of Service:  In process, will continue to follow  Sherrilyn Rist [phone] 11/23/2017, 3:30 PM

## 2017-11-23 NOTE — Progress Notes (Addendum)
[place] for heparin Indication: chest pain/ACS  Allergies  Allergen Reactions  . Demerol Anaphylaxis    Patient Measurements: Height: 5\' 10"  (177.8 cm) Weight: 151 lb 7.3 oz (68.7 kg) IBW/kg (Calculated) : 73 Heparin Dosing Weight: 70.7 kg  Vital Signs: Temp: 98.2 F (36.8 C) (10/14 2300) Temp Source: Axillary (10/14 2300) BP: 107/63 (10/14 2300)  Labs: Recent Labs    11/21/17 0652 11/21/17 0937 11/21/17 1604 11/22/17 0149 11/23/17 0027  HGB 13.3  --   --  13.8 12.4*  HCT 41.6  --   --  42.8 37.7*  PLT 196  --   --  203 191  HEPARINUNFRC 0.36  --  0.31 0.27* 0.34  CREATININE  --  1.24  --  1.20 1.20  TROPONINI  --  8.36*  --   --   --     Estimated Creatinine Clearance: 38.2 mL/min (by C-G formula based on SCr of 1.2 mg/dL).  Assessment: 84 y.o. male with multivessel CAD for heparin   Goal of Therapy:  Heparin level 0.3-0.7 units/ml Monitor platelets by anticoagulation protocol: Yes   Plan:  Continue Heparin at current rate  Phillis Knack, PharmD, BCPS

## 2017-11-23 NOTE — Evaluation (Signed)
Physical Therapy Evaluation Patient Details Name: Justin [name] MRN: [id number] DOB: 01/23/1924 Today's Date: 11/23/2017   History of Present Illness  84 y/o male with PAD (b/l mod carotid stenosis), s/p endovascular repair of 5.5 cm infrarenal abdominal aorta aneurysm 06/03/2016, hypertension, h/o postop Afib in 2013 with no reported recurrence, h/o basal cell carcinoma, being admitted with NSTEMI  Clinical Impression  Patient presents with decreased mobility due to deficits listed in PT problem list.  Currently needing minguard to S for mobility.  Was not using walker at home previous to admission and was independent in ADL.  Feel pt will benefit from skilled PT in the acute setting to maximize safety and independence prior to d/c home with wife assist and follow up HHPT.     Follow Up Recommendations Home health PT;Supervision/Assistance - 24 hour    Equipment Recommendations  3in1 (PT)    Recommendations for Other Services       Precautions / Restrictions Precautions Precautions: Fall      Mobility  Bed Mobility Overal bed mobility: Needs Assistance Bed Mobility: Supine to Sit     Supine to sit: Supervision;HOB elevated     General bed mobility comments: assist for lines, safety  Transfers Overall transfer level: Needs assistance Equipment used: None;4-wheeled walker Transfers: Sit to/from Stand Sit to Stand: Min guard         General transfer comment: assist for balance, initially no device pt reported dizziness,/imbalance so returned to sitting to obtain RW   Ambulation/Gait Ambulation/Gait assistance: Min guard Gait Distance (Feet): 200 Feet Assistive device: 4-wheeled walker Gait Pattern/deviations: Step-through pattern;Decreased stride length;Trunk flexed     General Gait Details: use of rollator without much difficulty but tendency to flex forward with seat in walker.  Discussed with wife best for regular walker she already has at home  Stairs             Wheelchair Mobility    Modified Rankin (Stroke Patients Only)       Balance Overall balance assessment: Needs assistance   Sitting balance-Leahy Scale: Good       Standing balance-Leahy Scale: Fair Standing balance comment: static standing without device, but close S for safety, to take a step needed UE support                             Pertinent Vitals/Pain Pain Assessment: No/denies pain    Home Living Family/patient expects to be discharged to:: Private residence Living Arrangements: Spouse/significant other Available Help at Discharge: Family Type of Home: House Home Access: Stairs to enter Entrance Stairs-Rails: Left Entrance Stairs-Number of Steps: 2 Home Layout: One level Home Equipment: Environmental consultant - 2 wheels Additional Comments: thinks has a shower seat in attic from mother    Prior Function Level of Independence: Independent               Hand Dominance   Dominant Hand: Right    Extremity/Trunk Assessment   Upper Extremity Assessment Upper Extremity Assessment: Overall WFL for tasks assessed    Lower Extremity Assessment Lower Extremity Assessment: Generalized weakness       Communication   Communication: No difficulties  Cognition Arousal/Alertness: Awake/alert Behavior During Therapy: WFL for tasks assessed/performed Overall Cognitive Status: History of cognitive impairments - at baseline  General Comments General comments (skin integrity, edema, etc.): Wife in room reports pt has compained of dizziness a bit at home.  Reviewed safety for if has symptoms at home to return to sitting and not to move if dizzy.    Exercises     Assessment/Plan    PT Assessment Patient needs continued PT services  PT Problem List Decreased mobility;Decreased balance;Decreased knowledge of use of DME;Decreased safety awareness       PT Treatment Interventions DME  instruction;Therapeutic activities;Gait training;Therapeutic exercise;Patient/family education;Balance training;Functional mobility training;Stair training    PT Goals (Current goals can be found in the Care Plan section)  Acute Rehab PT Goals Patient Stated Goal: to return to independent PT Goal Formulation: With patient/family Time For Goal Achievement: 11/30/17 Potential to Achieve Goals: Good    Frequency Min 3X/week   Barriers to discharge        Co-evaluation               AM-PAC PT "6 Clicks" Daily Activity  Outcome Measure Difficulty turning over in bed (including adjusting bedclothes, sheets and blankets)?: A Little Difficulty moving from lying on back to sitting on the side of the bed? : Unable Difficulty sitting down on and standing up from a chair with arms (e.g., wheelchair, bedside commode, etc,.)?: Unable Help needed moving to and from a bed to chair (including a wheelchair)?: A Little Help needed walking in hospital room?: A Little Help needed climbing 3-5 steps with a railing? : A Little 6 Click Score: 14    End of Session Equipment Utilized During Treatment: Gait belt Activity Tolerance: Patient tolerated treatment well Patient left: with call bell/phone within reach;in chair;with family/visitor present   PT Visit Diagnosis: Muscle weakness (generalized) (M62.81);Other abnormalities of gait and mobility (R26.89)    Time: 8315-1761 PT Time Calculation (min) (ACUTE ONLY): 26 min   Charges:   PT Evaluation $PT Eval Moderate Complexity: 1 Mod PT Treatments $Gait Training: 8-22 mins        Magda Kiel, PT Acute Rehabilitation Services [phone] 11/23/2017   Reginia Naas 11/23/2017, 4:02 PM

## 2017-11-23 NOTE — Consult Note (Signed)
[facility] CM Primary Care Navigator  11/23/2017  Justin [name] [birth year] [id number]   Met withpatientandwife (Doris)at the bedsidetoidentify possible discharge needs. Patientreports that he had left sided chest pain up to shoulder area that had started on their way home from dinner at a restaurant, which had led to this admission. (NSTEMI- non ST elevation myocardial infarction underwent cardiac cath)  PatientendorsesDr.Richard Reynaldo [facility] with [facility] primary [facility] provider.   [facility] [address] [facility].   Patientreportsthat wife is managinghismedicationsat home with use of"pillbox"system filled once a week.  Wife states that husband (patient) hasbeen drivingprior to admission but she will be providing transportation tohis doctors' appointments after discharge.  Patientliveswith wife who serves as his primary caregiver at home.  Anticipated plan for discharge ishome with home health services per therapy recommendation.  Patientand wife voiced understandingto callprimary care provider's office oncehereturnshome,for a post discharge follow-upvisit within 1- 2 weeksor sooner if needs arise.Patient letter (with PCP's contact number) was provided asareminder.  Explained topatientand wife aboutTHN CM services available for health management andresourcesat homebut both denied any current needs orconcernsat this time.Patient states that wife has been helping him manage his health issues so far. Patient and wife verbalizedunderstandingto seekreferral from primary care provider to [facility] care management ifdeemed necessary and appropriatefor anyservicesin thefuture.  [facility] care management information was provided for futureneeds that patientmay have.  Patienthowever,verbally agreedand optedforEMMIcalls  tofollow-up withhisrecovery at home.   Referral made for [facility] General calls after discharge.   For additional questions please contact:  Edwena Felty A. Maxx Pham, BSN, RN-BC [facility] PRIMARY CARE Navigator Cell: [phone]

## 2017-11-23 NOTE — Progress Notes (Addendum)
Subjective:  Denies chest pain Has episodes of confusion and disorientation  Objective:  Vital Signs in the last 24 hours: Temp:  [97.6 F (36.4 C)-98.2 F (36.8 C)] 97.6 F (36.4 C) (10/15 1137) Pulse Rate:  [66-74] 74 (10/15 1137) Resp:  [18-23] 20 (10/15 1137) BP: (99-107)/(52-81) 100/52 (10/15 1137) SpO2:  [98 %] 98 % (10/15 1137)  Intake/Output from previous day: 10/14 0701 - 10/15 0700 In: 112.8 [I.V.:112.8] Out: 400 [Urine:400] Intake/Output from this shift: No intake/output data recorded.  Physical Exam: Nursing note and vitals reviewed. Constitutional: He is oriented to person, place, and time. He appears well-developed and well-nourished. No distress.  Appears much younger than stated age  HENT:  Head: Normocephalic and atraumatic.  Eyes: Conjunctivae are normal.  Neck: No JVD present.  Cardiovascular: Normal rate, regular rhythm and normal heart sounds.  No murmur heard. Pulses:      Carotid pulses are on the right side with bruit, and on the left side with bruit.      Radial pulses are 2+ on the right side, and 2+ on the left side.       Femoral pulses are 3+ on the right side, and 2+ on the left side.      Dorsalis pedis pulses are 1+ on the right side, and 1+ on the left side.       Posterior tibial pulses are 0 on the right side, and 1+ on the left side.  Respiratory: Effort normal and breath sounds normal. He has no wheezes. He has no rales.  GI: Soft. Bowel sounds are normal. There is no tenderness. There is no rebound.  Musculoskeletal: He exhibits no edema.  Lymphadenopathy:    He has no cervical adenopathy.  Neurological: He is alert and oriented to person only.  Skin: Skin is warm and dry.  Warty mole X 2 on the back   Psychiatric: He has a normal mood and affect  Lab Results: Recent Labs    11/22/17 0149 11/23/17 0027  WBC 6.6 6.2  HGB 13.8 12.4*  PLT 203 191   Recent Labs    11/22/17 0149 11/23/17 0027  NA 140 138  K 4.0 3.8  CL  109 108  CO2 22 21*  GLUCOSE 114* 101*  BUN 30* 29*  CREATININE 1.20 1.20   Recent Labs    11/21/17 0937  TROPONINI 8.36*   Hepatic Function Panel No results for input(s): PROT, ALBUMIN, AST, ALT, ALKPHOS, BILITOT, BILIDIR, IBILI in the last 72 hours. Recent Labs    11/21/17 0652  CHOL 180   Cardiac studies:  EKG 11/20/2017: Sinus tachycardia, normal axis, normal conduction. Left atrial enlargement. Inferolateral ST depression. [facility] echocardiogram 11/21/2017: - Left ventricle: The cavity size was normal. There was moderate   concentric hypertrophy. Systolic function was normal. The   estimated ejection fraction was in the range of 55% to 60%. Holley   motion was normal; there were no regional Zumstein motion   abnormalities. Doppler parameters are consistent with abnormal   left ventricular relaxation (grade 1 diastolic dysfunction). - Aortic valve: Moderately calcified annulus. Trileaflet; mildly   calcified leaflets. There was no significant regurgitation.   Inadequate Doppler evaluation to assess stenosis. Visually, does   not appear significant. - Mitral valve: The findings are consistent with mild calcific   stenosis. mean PG 4 mmHg, MVA 1.8 cm2 at HR 74 bpm. Valve area by   pressure half-time: 1.86 cm^2. - Tricuspid valve: There was mild regurgitation. -  Pulmonary arteries: PA peak pressure: 29 mm Hg (S).  Cath 11/22/2017: LM: Distal LM bifurcation has severely calcified 95% stenosis LAD: Large diag 1 with ostial 95% stenosis          LAD/Diag 2 bifurcation 90% stenosis. Mid diag 2 60% diserase          Rest of the LAD looks fairly normal with good surgical targets on LAD as well as diag vessels LCx: Prox LCx tortuous with 70% disease          Moderate sized OM 1 with long 95% stenosis with good surgical targets  RCA: Prox-mid RCA 50% irregular disease  Normal LVEDP  Severely multivessel CAD   Assessment:  84 y/o male with PAD (b/l mod  carotid stenosis), s/p endovascular repair of 5.5 cm infrarenal abdominal aorta aneurysm 06/03/2016, hypertension, h/o postop Afib in 2013 with no reported recurrence, being admitted with NSTEMI  NSTEMI, peak trop 8.3 ng/mL Severe multivessel CAD, including distal LM bifurcation calcific stenosis: Not a surgical candidate. Appreciate Dr. Lucianne Lei Trigt's consult. Hospital delirium Hypertension Mild calcific mitral stenosis, aortic sclerosis. Preserved LVEF. B/l moderate carotid artery disease S/p endovascular repair of infrarenal AAA (2018) CKD 2, possible AKI resolved H/o post op Afib 2013  Plan: Stop heparin. Continue aspirin, plavix. Metoprolol 25 mg twice a day. Rosuvastatin 20 mg daily. Continue lisinopril-HCTZ 20-12.5, once daily (Twice daily at home)  On 10/14 evening, I had a long discussion with the patient and his family including wife, two sons, daughter in law and granddaughter. He is not a surgical candidate due to advanced age and dementia. Both would also increase his risk for high risk PCI that would require Impella support. Family concerned about post PCI care and his worsening dementia. Moreover, his functional capacity is limited due to back pain, which is probably the reason he has not had angina. As such, PCI would not make significant change to his quality of life. He is at increased risk of recurrent MI. I have explained to the family that without revascularization, future treatment for any MI would remain medical. I have encouraged them to discuss his overall goals of care, including code status and advanced directive.  OT/PT eval for discharge    LOS: 3 days    Elbert Spickler J Kherington Meraz 11/23/2017, 2:10 PM  [place], MD [facility] Pager: [phone] Office: [phone] If no answer Cell [phone]

## 2017-11-24 DIAGNOSIS — I10 Essential (primary) hypertension: Secondary | ICD-10-CM

## 2017-11-24 DIAGNOSIS — F0390 Unspecified dementia without behavioral disturbance: Secondary | ICD-10-CM

## 2017-11-24 DIAGNOSIS — I2510 Atherosclerotic heart disease of native coronary artery without angina pectoris: Secondary | ICD-10-CM | POA: Diagnosis not present

## 2017-11-24 DIAGNOSIS — I6523 Occlusion and stenosis of bilateral carotid arteries: Secondary | ICD-10-CM

## 2017-11-24 LAB — CBC
HEMATOCRIT: 36.5 % — AB (ref 39.0–52.0)
HEMOGLOBIN: 12.1 g/dL — AB (ref 13.0–17.0)
MCH: 32 pg (ref 26.0–34.0)
MCHC: 33.2 g/dL (ref 30.0–36.0)
MCV: 96.6 fL (ref 80.0–100.0)
Platelets: 192 10*3/uL (ref 150–400)
RBC: 3.78 MIL/uL — ABNORMAL LOW (ref 4.22–5.81)
RDW: 12.2 % (ref 11.5–15.5)
WBC: 6.6 10*3/uL (ref 4.0–10.5)
nRBC: 0 % (ref 0.0–0.2)

## 2017-11-24 NOTE — Evaluation (Signed)
Occupational Therapy Evaluation and Discharge Patient Details Name: Justin [name] MRN: [id number] DOB: [birth year] Today's Date: 11/24/2017    History of Present Illness 84 y/o male with PAD (b/l mod carotid stenosis), s/p endovascular repair of 5.5 cm infrarenal abdominal aorta aneurysm 06/03/2016, hypertension, h/o postop Afib in 2013 with no reported recurrence, h/o basal cell carcinoma, being admitted with NSTEMI   Clinical Impression   Pt is typically independent. Demonstrates mild unsteadiness in standing requiring RW and supervision for OOB activity. Pt with decreased awareness of deficits. Wife will be providing supervision of ADL for safety. Pt is agreeable to seated showering on 3 in 1 after education. No further OT needs.   Follow Up Recommendations  No OT follow up    Equipment Recommendations  3 in 1 bedside commode    Recommendations for Other Services       Precautions / Restrictions Precautions Precautions: Fall      Mobility Bed Mobility Overal bed mobility: Modified Independent             General bed mobility comments: no assist  Transfers Overall transfer level: Needs assistance Equipment used: Rolling walker (2 wheeled) Transfers: Sit to/from Stand Sit to Stand: Supervision         General transfer comment: for safety    Balance Overall balance assessment: Needs assistance   Sitting balance-Leahy Scale: Good       Standing balance-Leahy Scale: Fair Standing balance comment: can stand statically at sink                           ADL either performed or assessed with clinical judgement   ADL Overall ADL's : Needs assistance/impaired Eating/Feeding: Independent;Sitting   Grooming: Wash/dry hands;Standing;Supervision/safety   Upper Body Bathing: Supervision/ safety;Sitting   Lower Body Bathing: Supervison/ safety;Sit to/from stand   Upper Body Dressing : Set up;Sitting   Lower Body Dressing: Supervision/safety;Sit  to/from stand   Toilet Transfer: Supervision/safety;Ambulation;RW   Toileting- Clothing Manipulation and Hygiene: Supervision/safety;Sit to/from stand       Functional mobility during ADLs: Supervision/safety;Rolling walker       Vision Baseline Vision/History: Wears glasses Patient Visual Report: No change from baseline       Perception     Praxis      Pertinent Vitals/Pain Pain Assessment: No/denies pain     Hand Dominance Right   Extremity/Trunk Assessment Upper Extremity Assessment Upper Extremity Assessment: Overall WFL for tasks assessed   Lower Extremity Assessment Lower Extremity Assessment: Defer to PT evaluation       Communication Communication Communication: HOH(with aids)   Cognition Arousal/Alertness: Awake/alert Behavior During Therapy: WFL for tasks assessed/performed Overall Cognitive Status: History of cognitive impairments - at baseline                                 General Comments: pt with decreased awareness of deficits   General Comments       Exercises     Shoulder Instructions      Home Living Family/patient expects to be discharged to:: Private residence Living Arrangements: Spouse/significant other Available Help at Discharge: Family;Available 24 hours/day Type of Home: House Home Access: Stairs to enter [employer] of Steps: 2 Entrance Stairs-Rails: Left Home Layout: One level     Bathroom Shower/Tub: Occupational psychologist: Standard     Home Equipment: Environmental consultant - 2 wheels  Additional Comments: pt resistant to sitting to shower, but wife agrees pt is weak and should have seat available      Prior Functioning/Environment Level of Independence: Independent        Comments: drives, 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        OT Problem List:        OT Treatment/Interventions:      OT Goals(Current goals can be found in the care plan section) Acute Rehab OT  Goals Patient Stated Goal: to return to independent  OT Frequency:     Barriers to D/C:            Co-evaluation              AM-PAC PT "6 Clicks" Daily Activity     Outcome Measure Help from another person eating meals?: None Help from another person taking care of personal grooming?: A Little Help from another person toileting, which includes using toliet, bedpan, or urinal?: A Little Help from another person bathing (including washing, rinsing, drying)?: A Little Help from another person to put on and taking off regular upper body clothing?: None Help from another person to put on and taking off regular lower body clothing?: A Little 6 Click Score: 20   End of Session Equipment Utilized During Treatment: Gait belt;Rolling walker  Activity Tolerance: Patient tolerated treatment well Patient left: in bed;with call bell/phone within reach;with family/visitor present  OT Visit Diagnosis: Other abnormalities of gait and mobility (R26.89);Unsteadiness on feet (R26.81);Other symptoms and signs involving cognitive function                Time: 1324-4010 OT Time Calculation (min): 17 min Charges:  OT General Charges $OT Visit: 1 Visit OT Evaluation $OT Eval Moderate Complexity: 1 Mod  Justin [name], OTR/L Acute Rehabilitation Services Pager: [phone] Office: [phone]  Justin [name] 11/24/2017, 11:22 AM

## 2017-11-24 NOTE — Progress Notes (Addendum)
Physical Therapy Treatment Patient Details Name: Justin [name] MRN: [id number] DOB: Sep 14, 1923 Today's Date: 11/24/2017    History of Present Illness 84 y/o male with PAD (b/l mod carotid stenosis), s/p endovascular repair of 5.5 cm infrarenal abdominal aorta aneurysm 06/03/2016, hypertension, h/o postop Afib in 2013 with no reported recurrence, h/o basal cell carcinoma, being admitted with NSTEMI    PT Comments    Pt admitted with above diagnosis. Pt currently with functional limitations due to balance and endurance deficits. Discussed home plans with pt and family.  Pt to use regular RW at home.  Pt aware to use at all times for safety.  Pt also agreeable to getting a 3N1 commode.  Pt aware it will go over toilet to raise height and can also be used beside bed.  HHPT will also be coming to the home.  Called CM about equipment and d/c needs.  Pt going home today.  Wife feels comfortable helping pt.  Pt will benefit from skilled PT to increase their independence and safety with mobility to allow discharge to the venue listed below.     Follow Up Recommendations  Home health PT;Supervision/Assistance - 24 hour     Equipment Recommendations  3in1 (PT);Rolling walker with 5" wheels    Recommendations for Other Services       Precautions / Restrictions Precautions Precautions: Fall    Mobility  Bed Mobility               General bed mobility comments: Discussing d/c needs . Pt saving strength for home.   Transfers                    Ambulation/Gait                 Stairs             Wheelchair Mobility    Modified Rankin (Stroke Patients Only)       Balance                                            Cognition Arousal/Alertness: Awake/alert Behavior During Therapy: WFL for tasks assessed/performed Overall Cognitive Status: History of cognitive impairments - at baseline                                         Exercises      General Comments        Pertinent Vitals/Pain Pain Assessment: No/denies pain    Home Living                      Prior Function            PT Goals (current goals can now be found in the care plan section) Progress towards PT goals: Progressing toward goals(D/C today)    Frequency    Min 3X/week      PT Plan Current plan remains appropriate    Co-evaluation              AM-PAC PT "6 Clicks" Daily Activity  Outcome Measure  Difficulty turning over in bed (including adjusting bedclothes, sheets and blankets)?: A Little Difficulty moving from lying on back to sitting on the side of the bed? : Unable Difficulty sitting down  on and standing up from a chair with arms (e.g., wheelchair, bedside commode, etc,.)?: Unable Help needed moving to and from a bed to chair (including a wheelchair)?: A Little Help needed walking in hospital room?: A Little Help needed climbing 3-5 steps with a railing? : A Little 6 Click Score: 14    End of Session   Activity Tolerance: Patient tolerated treatment well Patient left: in bed;with call bell/phone within reach;with family/visitor present   PT Visit Diagnosis: Muscle weakness (generalized) (M62.81);Other abnormalities of gait and mobility (R26.89)     Time: 4734-0370 PT Time Calculation (min) (ACUTE ONLY): 9 min  Charges:  $Self Care/Home Management: 8-22                     [place] Pager:  [phone]  Office:  [place] 11/24/2017, 9:59 AM

## 2017-11-24 NOTE — Progress Notes (Signed)
Pt has been walking with PT and for d/c this am. Discussed MI, restrictions, mobility and NTG use with pt, wife and son. They are thankful and planning to see how he does with HHPT. N/a for CRPII with severe LM disease.  [place] CES, ACSM 10:31 AM 11/24/2017

## 2017-11-24 NOTE — Discharge Summary (Signed)
Physician Discharge Summary  Patient ID: BEAR OSTEN MRN: [id number] DOB/AGE: [birth year] 84 y.o.  Admit date: 11/20/2017 Discharge date: 11/24/2017 Admission Diagnoses: Chest pain NSTEMI Hypertension Dementia H/o AAA repair Carotid artery stenosis, asymptomatic, bilateral   Discharge Diagnoses:  Active Problems:   S/P AAA repair   NSTEMI (non-ST elevated myocardial infarction) ([place])   Coronary artery disease   Dementia ([place])   Essential hypertension   Carotid artery stenosis, asymptomatic, bilateral   Discharged Condition:  Stable  Hospital Course:   84 y/o male with PAD (b/l mod carotid stenosis), s/p endovascular repair of 5.5 cm infrarenal abdominal aorta aneurysm 06/03/2016, hypertension, h/o postop Afib in 2013 with no reported recurrence, admitted with NSTEMI on 11/20/2017.  Coronary angiogram showed severe multivessel disease, including severe distal left main calcific bifurcation disease.  Options for treatment were discussed with the patient.  He was deemed to be nonsurgical candidate due to his advanced age and progressive dementia and confusion.  For the same reason, I did not think that in patella assisted complex atherectomy procedure would have more than office and home.  Moreover, he is fairly asymptomatic at his limited physical capacity.  After long discussions with patient and family, and mutually decided on continuing medical treatment without any aggressive invasive measures.  I also encouraged the family to discuss patient's advanced directive and CODE STATUS.  I will follow-up on this during outpatient visit.  Patient will be discharged on aspirin, Plavix, as well as adding 10 mg daily, metoprolol succinate 25 mg daily.  I'll stop his lisinopril/HCTZ due to his relatively lower blood pressure.  Home health with physical therapy will be arranged for the patient.  All questions by family were answered.  Consults:  CVTS  Significant Diagnostic  Studies:  Hospital echocardiogram 11/21/2017: - Left ventricle: The cavity size was normal. There was moderate   concentric hypertrophy. Systolic function was normal. The   estimated ejection fraction was in the range of 55% to 60%. Wheeler   motion was normal; there were no regional Luty motion   abnormalities. Doppler parameters are consistent with abnormal   left ventricular relaxation (grade 1 diastolic dysfunction). - Aortic valve: Moderately calcified annulus. Trileaflet; mildly   calcified leaflets. There was no significant regurgitation.   Inadequate Doppler evaluation to assess stenosis. Visually, does   not appear significant. - Mitral valve: The findings are consistent with mild calcific   stenosis. mean PG 4 mmHg, MVA 1.8 cm2 at HR 74 bpm. Valve area by   pressure half-time: 1.86 cm^2. - Tricuspid valve: There was mild regurgitation. - Pulmonary arteries: PA peak pressure: 29 mm Hg (S).  Cath 11/22/2017: LM: Distal LM bifurcation has severely calcified 95% stenosis LAD: Large diag 1 with ostial 95% stenosis          LAD/Diag 2 bifurcation 90% stenosis. Mid diag 2 60% diserase          Rest of the LAD looks fairly normal with good surgical targets on LAD as well as diag vessels LCx: Prox LCx tortuous with 70% disease          Moderate sized OM 1 with long 95% stenosis with good surgical targets  RCA: Prox-mid RCA 50% irregular disease  Normal LVEDP  Severely multivessel CAD  Results for AMEDEO, DETWEILER (MRN [id number]) as of 11/24/2017 11:25  Ref. Range 11/24/2017 02:35  WBC Latest Ref Range: 4.0 - 10.5 K/uL 6.6  RBC Latest Ref Range: 4.22 - 5.81 MIL/uL 3.78 (L)  Hemoglobin Latest  Ref Range: 13.0 - 17.0 g/dL 12.1 (L)  HCT Latest Ref Range: 39.0 - 52.0 % 36.5 (L)  MCV Latest Ref Range: 80.0 - 100.0 fL 96.6  MCH Latest Ref Range: 26.0 - 34.0 pg 32.0  MCHC Latest Ref Range: 30.0 - 36.0 g/dL 33.2  RDW Latest Ref Range: 11.5 - 15.5 % 12.2  Platelets Latest Ref Range: 150  - 400 K/uL 192  nRBC Latest Ref Range: 0.0 - 0.2 % 0.0   Results for DORSE, LOCY (MRN [id number]) as of 11/24/2017 11:25  Ref. Range 11/23/2017 81:44  BASIC METABOLIC PANEL Unknown Rpt (A)  Sodium Latest Ref Range: 135 - 145 mmol/L 138  Potassium Latest Ref Range: 3.5 - 5.1 mmol/L 3.8  Chloride Latest Ref Range: 98 - 111 mmol/L 108  CO2 Latest Ref Range: 22 - 32 mmol/L 21 (L)  Glucose Latest Ref Range: 70 - 99 mg/dL 101 (H)  BUN Latest Ref Range: 8 - 23 mg/dL 29 (H)  Creatinine Latest Ref Range: 0.61 - 1.24 mg/dL 1.20  Calcium Latest Ref Range: 8.9 - 10.3 mg/dL 8.9  Anion gap Latest Ref Range: 5 - 15  9  GFR, Est Non African American Latest Ref Range: >60 mL/min 51 (L)  GFR, Est African American Latest Ref Range: >60 mL/min 59 (L)    Results for DWON, SKY (MRN [id number]) as of 11/24/2017 11:25  Ref. Range 11/23/2017 70:26  BASIC METABOLIC PANEL Unknown Rpt (A)  Sodium Latest Ref Range: 135 - 145 mmol/L 138  Potassium Latest Ref Range: 3.5 - 5.1 mmol/L 3.8  Chloride Latest Ref Range: 98 - 111 mmol/L 108  CO2 Latest Ref Range: 22 - 32 mmol/L 21 (L)  Glucose Latest Ref Range: 70 - 99 mg/dL 101 (H)  BUN Latest Ref Range: 8 - 23 mg/dL 29 (H)  Creatinine Latest Ref Range: 0.61 - 1.24 mg/dL 1.20  Calcium Latest Ref Range: 8.9 - 10.3 mg/dL 8.9  Anion gap Latest Ref Range: 5 - 15  9  GFR, Est Non African American Latest Ref Range: >60 mL/min 51 (L)  GFR, Est African American Latest Ref Range: >60 mL/min 59 (L)    Treatments:  Medical management for CAD, as above.  Discharge Exam: Blood pressure 104/75, pulse 73, temperature 98 F (36.7 C), temperature source Oral, resp. rate 18, height 5\' 10"  (1.778 m), weight 68.7 kg, SpO2 99 %. Nursing noteand vitalsreviewed. Constitutional: He isoriented to person, place, and time. He appearswell-developedand well-nourished.No distress. Appears much younger than stated age HENT:  Head:Normocephalicand atraumatic.   Eyes:Conjunctivaeare normal.  Neck:No JVDpresent.  Cardiovascular:Normal rate,regular rhythmand normal heart sounds. No murmurheard. Pulses: Carotid pulses are on the right side with bruit, and on the left side withbruit. Radial pulses are 2+on the right side, and 2+on the left side.  Femoral pulses are 3+on the right side, and 2+on the left side. Dorsalis pedis pulses are 1+on the right side, and 1+on the left side.  Posterior tibial pulses are 0on the right side, and 1+on the left side.  Respiratory:Effort normaland breath sounds normal. He hasno wheezes. He hasno rales.  VZ:CHYI.Bowel sounds are normal. There isno tenderness. There isno rebound.  Musculoskeletal: He exhibits noedema.  Lymphadenopathy:  He has no cervical adenopathy.  Neurological: He isalertand oriented to person only.  Skin: Skin iswarmand dry. Warty mole X 2 on the back   Disposition: Discharge disposition: 01-Home or Self Care       Discharge Instructions    Diet - low sodium heart  healthy   Complete by:  As directed    Diet - low sodium heart healthy   Complete by:  As directed    Face-to-face encounter (required for Medicare/Medicaid patients)   Complete by:  As directed    I [place] certify that this patient is under my care and that I, or a nurse practitioner or physician's assistant working with me, had a face-to-face encounter that meets the physician face-to-face encounter requirements with this patient on 11/23/2017. The encounter with the patient was in whole, or in part for the following medical condition(s) which is the primary reason for home health care (List medical condition):  Mobility limitation   The encounter with the patient was in whole, or in part, for the following medical condition, which is the primary reason for home health care:  Mobility limitation   I certify that, based on my findings, the following  services are medically necessary home health services:  Physical therapy   Reason for Medically [place]:  Therapy- Personnel officer, Public librarian   My clinical findings support the need for the above services:  Cognitive impairments, dementia, or mental confusion  that make it unsafe to leave home   Further, I certify that my clinical findings support that this patient is homebound due to:  Ambulates short distances less than 300 feet   Home Health   Complete by:  As directed    To provide the following care/treatments:  PT   Increase activity slowly   Complete by:  As directed    Increase activity slowly   Complete by:  As directed      Allergies as of 11/24/2017      Reactions   Demerol Anaphylaxis      Medication List    STOP taking these medications   lisinopril-hydrochlorothiazide 20-12.5 MG tablet Commonly known as:  PRINZIDE,ZESTORETIC     TAKE these medications   acetaminophen 500 MG tablet Commonly known as:  TYLENOL Take 1,000 mg by mouth daily as needed (back pain).   aspirin EC 81 MG tablet Take 81 mg by mouth daily.   bisacodyl 5 MG EC tablet Commonly known as:  DULCOLAX Take 1 tablet (5 mg total) by mouth daily as needed for moderate constipation.   brimonidine 0.2 % ophthalmic solution Commonly known as:  ALPHAGAN Place 1 drop into both eyes 2 (two) times daily.   clopidogrel 75 MG tablet Commonly known as:  PLAVIX Take 1 tablet (75 mg total) by mouth daily.   donepezil 5 MG tablet Commonly known as:  ARICEPT Take 5 mg by mouth daily.   dorzolamide-timolol 22.3-6.8 MG/ML ophthalmic solution Commonly known as:  COSOPT Place 1 drop into both eyes 2 (two) times daily.   dutasteride 0.5 MG capsule Commonly known as:  AVODART Take 0.5 mg by mouth daily.   LUBRICANT EYE OP Place 1 drop into both eyes daily as needed (dry eye).   LUMIGAN 0.01 % Soln Generic drug:  bimatoprost Place 1 drop into both eyes  every other day.   metoprolol succinate 25 MG 24 hr tablet Commonly known as:  TOPROL-XL Take 1 tablet (25 mg total) by mouth daily.   nitroGLYCERIN 0.4 MG SL tablet Commonly known as:  NITROSTAT Place 1 tablet (0.4 mg total) under the tongue every 5 (five) minutes x 3 doses as needed for chest pain.   rosuvastatin 10 MG tablet Commonly known as:  CRESTOR Take 1 tablet (10 mg  total) by mouth daily at 6 PM.   traMADol 50 MG tablet Commonly known as:  ULTRAM Take 50 mg by mouth daily.   TRAVATAN Z 0.004 % Soln ophthalmic solution Generic drug:  Travoprost (BAK Free) Place 1 drop into the left eye See admin instructions. On odd days            Durable Medical Equipment  (From admission, onward)         Start     Ordered   11/24/17 0912  For home use only DME 3 n 1  Once     11/24/17 3833         Follow-up Information    Burnard Bunting, MD.   Specialty:  Internal Medicine Contact information: [address] [place] [address] [phone]        Nigel Mormon, MD. Schedule an appointment as soon as possible for a visit on 11/24/2017.   Specialty:  Cardiology Why:  Our office will contact you for an appt Contact information: [address] [place] [address] [place] Follow up.   Why:  bedside commode, rolling walker Contact information: [address] [place] [address] [phone]        [facility].   Specialty:  Home Health Services Why:  Physical therapy Contact information: [address] [place] [address] [phone]           Signed: Nigel Mormon 11/24/2017, 11:22 AM  Nigel Mormon, MD [facility] Pager: [phone] Office: [phone] If no answer Cell [phone]

## 2017-11-24 NOTE — Plan of Care (Signed)
Pt discharging to home, all discharge instructions to be provided to patient and patients wife at bedside.

## 2017-11-24 NOTE — Care Management Note (Addendum)
Case Management Note Previous Note created by Olga Coaster   Patient Details  Name: Justin [name] MRN: [id number] Date of Birth: 11/26/1923  Subjective/Objective:  NSTEMI                 Action/Plan: Patient lives at home with spouse; 2 sons supportive; [id number], Delfino Lovett, MD; has private insurance with Medicare; awaiting for Physical Therapy eval for disposition needs. CM will continue to follow for progression of care.  Expected Discharge Date:  11/24/17               Expected Discharge Plan:  [place] possibly  Discharge planning Services  CM Consult  Status of Service:  Completed, signed off  Justin Witting [name], Justin [name], Pt discharging home today with HHPT, 3:1 and RW.  Pt will discharge home with wife via private vehicle.  Wife will provide 24/7 supervision at home.  CM offered HH choice and DME choice - pt chose [facility] - referral called to agency and referral accepted.   11/24/2017, 10:32 AM

## 2017-11-25 DIAGNOSIS — Z85828 Personal history of other malignant neoplasm of skin: Secondary | ICD-10-CM | POA: Diagnosis not present

## 2017-11-25 DIAGNOSIS — Z7982 Long term (current) use of aspirin: Secondary | ICD-10-CM | POA: Diagnosis not present

## 2017-11-25 DIAGNOSIS — I4891 Unspecified atrial fibrillation: Secondary | ICD-10-CM | POA: Diagnosis not present

## 2017-11-25 DIAGNOSIS — Z87891 Personal history of nicotine dependence: Secondary | ICD-10-CM | POA: Diagnosis not present

## 2017-11-25 DIAGNOSIS — I214 Non-ST elevation (NSTEMI) myocardial infarction: Secondary | ICD-10-CM | POA: Diagnosis not present

## 2017-11-25 DIAGNOSIS — I2510 Atherosclerotic heart disease of native coronary artery without angina pectoris: Secondary | ICD-10-CM | POA: Diagnosis not present

## 2017-11-25 DIAGNOSIS — Z95828 Presence of other vascular implants and grafts: Secondary | ICD-10-CM | POA: Diagnosis not present

## 2017-11-25 DIAGNOSIS — M47816 Spondylosis without myelopathy or radiculopathy, lumbar region: Secondary | ICD-10-CM | POA: Diagnosis not present

## 2017-11-25 DIAGNOSIS — I6523 Occlusion and stenosis of bilateral carotid arteries: Secondary | ICD-10-CM | POA: Diagnosis not present

## 2017-11-25 DIAGNOSIS — Z79891 Long term (current) use of opiate analgesic: Secondary | ICD-10-CM | POA: Diagnosis not present

## 2017-11-25 DIAGNOSIS — F0390 Unspecified dementia without behavioral disturbance: Secondary | ICD-10-CM | POA: Diagnosis not present

## 2017-11-25 DIAGNOSIS — I2584 Coronary atherosclerosis due to calcified coronary lesion: Secondary | ICD-10-CM | POA: Diagnosis not present

## 2017-11-25 DIAGNOSIS — N183 Chronic kidney disease, stage 3 (moderate): Secondary | ICD-10-CM | POA: Diagnosis not present

## 2017-11-25 DIAGNOSIS — I129 Hypertensive chronic kidney disease with stage 1 through stage 4 chronic kidney disease, or unspecified chronic kidney disease: Secondary | ICD-10-CM | POA: Diagnosis not present

## 2017-12-02 DIAGNOSIS — I214 Non-ST elevation (NSTEMI) myocardial infarction: Secondary | ICD-10-CM | POA: Diagnosis not present

## 2017-12-02 DIAGNOSIS — F0390 Unspecified dementia without behavioral disturbance: Secondary | ICD-10-CM | POA: Diagnosis not present

## 2017-12-02 DIAGNOSIS — I6523 Occlusion and stenosis of bilateral carotid arteries: Secondary | ICD-10-CM | POA: Diagnosis not present

## 2017-12-02 DIAGNOSIS — I2510 Atherosclerotic heart disease of native coronary artery without angina pectoris: Secondary | ICD-10-CM | POA: Diagnosis not present

## 2017-12-02 DIAGNOSIS — I2584 Coronary atherosclerosis due to calcified coronary lesion: Secondary | ICD-10-CM | POA: Diagnosis not present

## 2017-12-02 DIAGNOSIS — M47816 Spondylosis without myelopathy or radiculopathy, lumbar region: Secondary | ICD-10-CM | POA: Diagnosis not present

## 2017-12-05 ENCOUNTER — Encounter (HOSPITAL_COMMUNITY): Payer: Self-pay | Admitting: Emergency Medicine

## 2017-12-05 ENCOUNTER — Observation Stay (HOSPITAL_COMMUNITY)
Admission: EM | Admit: 2017-12-05 | Discharge: 2017-12-06 | Disposition: A | Discharge status: Home / Self Care | Payer: Medicare Other | Attending: Cardiology | Admitting: Cardiology

## 2017-12-05 DIAGNOSIS — I6523 Occlusion and stenosis of bilateral carotid arteries: Secondary | ICD-10-CM | POA: Insufficient documentation

## 2017-12-05 DIAGNOSIS — I214 Non-ST elevation (NSTEMI) myocardial infarction: Principal | ICD-10-CM | POA: Insufficient documentation

## 2017-12-05 DIAGNOSIS — Z87891 Personal history of nicotine dependence: Secondary | ICD-10-CM | POA: Insufficient documentation

## 2017-12-05 DIAGNOSIS — I2510 Atherosclerotic heart disease of native coronary artery without angina pectoris: Secondary | ICD-10-CM | POA: Diagnosis present

## 2017-12-05 DIAGNOSIS — R0902 Hypoxemia: Secondary | ICD-10-CM | POA: Diagnosis not present

## 2017-12-05 DIAGNOSIS — Z7902 Long term (current) use of antithrombotics/antiplatelets: Secondary | ICD-10-CM | POA: Insufficient documentation

## 2017-12-05 DIAGNOSIS — R0789 Other chest pain: Secondary | ICD-10-CM | POA: Diagnosis not present

## 2017-12-05 DIAGNOSIS — I714 Abdominal aortic aneurysm, without rupture: Secondary | ICD-10-CM | POA: Insufficient documentation

## 2017-12-05 DIAGNOSIS — D649 Anemia, unspecified: Secondary | ICD-10-CM | POA: Insufficient documentation

## 2017-12-05 DIAGNOSIS — F0390 Unspecified dementia without behavioral disturbance: Secondary | ICD-10-CM | POA: Diagnosis not present

## 2017-12-05 DIAGNOSIS — I129 Hypertensive chronic kidney disease with stage 1 through stage 4 chronic kidney disease, or unspecified chronic kidney disease: Secondary | ICD-10-CM | POA: Diagnosis not present

## 2017-12-05 DIAGNOSIS — Z85828 Personal history of other malignant neoplasm of skin: Secondary | ICD-10-CM | POA: Diagnosis not present

## 2017-12-05 DIAGNOSIS — I213 ST elevation (STEMI) myocardial infarction of unspecified site: Secondary | ICD-10-CM | POA: Diagnosis not present

## 2017-12-05 DIAGNOSIS — Z9889 Other specified postprocedural states: Secondary | ICD-10-CM

## 2017-12-05 DIAGNOSIS — N289 Disorder of kidney and ureter, unspecified: Secondary | ICD-10-CM | POA: Diagnosis not present

## 2017-12-05 DIAGNOSIS — Z7982 Long term (current) use of aspirin: Secondary | ICD-10-CM | POA: Diagnosis not present

## 2017-12-05 DIAGNOSIS — R079 Chest pain, unspecified: Secondary | ICD-10-CM | POA: Diagnosis not present

## 2017-12-05 DIAGNOSIS — N183 Chronic kidney disease, stage 3 (moderate): Secondary | ICD-10-CM | POA: Insufficient documentation

## 2017-12-05 DIAGNOSIS — Z8679 Personal history of other diseases of the circulatory system: Secondary | ICD-10-CM

## 2017-12-05 DIAGNOSIS — I10 Essential (primary) hypertension: Secondary | ICD-10-CM | POA: Diagnosis not present

## 2017-12-05 LAB — CBC WITH DIFFERENTIAL/PLATELET
ABS IMMATURE GRANULOCYTES: 0.02 10*3/uL (ref 0.00–0.07)
BASOS ABS: 0 10*3/uL (ref 0.0–0.1)
Basophils Relative: 1 %
Eosinophils Absolute: 0.1 10*3/uL (ref 0.0–0.5)
Eosinophils Relative: 1 %
HEMATOCRIT: 35.7 % — AB (ref 39.0–52.0)
Hemoglobin: 11.5 g/dL — ABNORMAL LOW (ref 13.0–17.0)
Immature Granulocytes: 0 %
Lymphocytes Relative: 13 %
Lymphs Abs: 0.8 10*3/uL (ref 0.7–4.0)
MCH: 31.7 pg (ref 26.0–34.0)
MCHC: 32.2 g/dL (ref 30.0–36.0)
MCV: 98.3 fL (ref 80.0–100.0)
Monocytes Absolute: 0.5 10*3/uL (ref 0.1–1.0)
Monocytes Relative: 8 %
NEUTROS ABS: 4.6 10*3/uL (ref 1.7–7.7)
NEUTROS PCT: 77 %
Platelets: 176 10*3/uL (ref 150–400)
RBC: 3.63 MIL/uL — AB (ref 4.22–5.81)
RDW: 12.4 % (ref 11.5–15.5)
WBC: 6 10*3/uL (ref 4.0–10.5)
nRBC: 0 % (ref 0.0–0.2)

## 2017-12-05 LAB — I-STAT CHEM 8, ED
BUN: 23 mg/dL (ref 8–23)
Calcium, Ion: 1.16 mmol/L (ref 1.15–1.40)
Chloride: 110 mmol/L (ref 98–111)
Creatinine, Ser: 1.3 mg/dL — ABNORMAL HIGH (ref 0.61–1.24)
Glucose, Bld: 117 mg/dL — ABNORMAL HIGH (ref 70–99)
HEMATOCRIT: 31 % — AB (ref 39.0–52.0)
HEMOGLOBIN: 10.5 g/dL — AB (ref 13.0–17.0)
POTASSIUM: 3.7 mmol/L (ref 3.5–5.1)
Sodium: 143 mmol/L (ref 135–145)
TCO2: 22 mmol/L (ref 22–32)

## 2017-12-05 LAB — I-STAT TROPONIN, ED
TROPONIN I, POC: 0.14 ng/mL — AB (ref 0.00–0.08)
Troponin i, poc: 0.72 ng/mL (ref 0.00–0.08)
Troponin i, poc: 0.99 ng/mL (ref 0.00–0.08)

## 2017-12-05 LAB — TROPONIN I: Troponin I: 2.38 ng/mL (ref <0.03)

## 2017-12-05 MED ORDER — ACETAMINOPHEN 500 MG PO TABS: 1000.0000 mg | ORAL_TABLET | Freq: Every day | ORAL | Status: DC | PRN

## 2017-12-05 MED ORDER — DONEPEZIL HCL 5 MG PO TABS
5.0000 mg | ORAL_TABLET | Freq: Every day | ORAL | Status: DC
  Administered 2017-12-06: 5 mg via ORAL
  Filled 2017-12-05: qty 1

## 2017-12-05 MED ORDER — BRIMONIDINE TARTRATE 0.2 % OP SOLN
1.0000 [drp] | Freq: Two times a day (BID) | OPHTHALMIC | Status: DC
  Administered 2017-12-05 – 2017-12-06 (×2): 1 [drp] via OPHTHALMIC
  Filled 2017-12-05: qty 5

## 2017-12-05 MED ORDER — TICAGRELOR 90 MG PO TABS
180.0000 mg | ORAL_TABLET | Freq: Once | ORAL | Status: AC
  Administered 2017-12-05: 180 mg via ORAL
  Filled 2017-12-05: qty 2

## 2017-12-05 MED ORDER — METOPROLOL SUCCINATE ER 25 MG PO TB24
25.0000 mg | ORAL_TABLET | Freq: Two times a day (BID) | ORAL | Status: DC
  Administered 2017-12-05 – 2017-12-06 (×2): 25 mg via ORAL
  Filled 2017-12-05 (×2): qty 1

## 2017-12-05 MED ORDER — ISOSORBIDE DINITRATE 30 MG PO TABS
60.0000 mg | ORAL_TABLET | Freq: Once | ORAL | Status: AC
  Administered 2017-12-05: 60 mg via ORAL
  Filled 2017-12-05: qty 2

## 2017-12-05 MED ORDER — DORZOLAMIDE HCL-TIMOLOL MAL 2-0.5 % OP SOLN
1.0000 [drp] | Freq: Two times a day (BID) | OPHTHALMIC | Status: DC
  Administered 2017-12-05 – 2017-12-06 (×2): 1 [drp] via OPHTHALMIC
  Filled 2017-12-05: qty 10

## 2017-12-05 MED ORDER — LATANOPROST 0.005 % OP SOLN
1.0000 [drp] | Freq: Every day | OPHTHALMIC | Status: DC
  Administered 2017-12-05: 1 [drp] via OPHTHALMIC
  Filled 2017-12-05: qty 2.5

## 2017-12-05 MED ORDER — NITROGLYCERIN 0.4 MG SL SUBL: 0.4000 mg | SUBLINGUAL_TABLET | SUBLINGUAL | Status: DC | PRN

## 2017-12-05 MED ORDER — RANOLAZINE ER 500 MG PO TB12
500.0000 mg | ORAL_TABLET | Freq: Two times a day (BID) | ORAL | Status: DC
  Administered 2017-12-05 – 2017-12-06 (×2): 500 mg via ORAL
  Filled 2017-12-05 (×2): qty 1

## 2017-12-05 MED ORDER — TICAGRELOR 90 MG PO TABS: 90.0000 mg | ORAL_TABLET | Freq: Two times a day (BID) | ORAL | Status: DC

## 2017-12-05 MED ORDER — TRAMADOL HCL 50 MG PO TABS
50.0000 mg | ORAL_TABLET | Freq: Every day | ORAL | Status: DC
  Administered 2017-12-06: 50 mg via ORAL
  Filled 2017-12-05: qty 1

## 2017-12-05 MED ORDER — DUTASTERIDE 0.5 MG PO CAPS
0.5000 mg | ORAL_CAPSULE | Freq: Every day | ORAL | Status: DC
  Administered 2017-12-06: 0.5 mg via ORAL
  Filled 2017-12-05: qty 1

## 2017-12-05 MED ORDER — ROSUVASTATIN CALCIUM 10 MG PO TABS
10.0000 mg | ORAL_TABLET | Freq: Every day | ORAL | Status: DC
  Administered 2017-12-05: 10 mg via ORAL
  Filled 2017-12-05: qty 1

## 2017-12-05 MED ORDER — ONDANSETRON HCL 4 MG/2ML IJ SOLN: 4.0000 mg | Freq: Four times a day (QID) | INTRAMUSCULAR | Status: DC | PRN

## 2017-12-05 MED ORDER — ASPIRIN EC 81 MG PO TBEC
81.0000 mg | DELAYED_RELEASE_TABLET | Freq: Every day | ORAL | Status: DC
  Administered 2017-12-06: 81 mg via ORAL
  Filled 2017-12-05: qty 1

## 2017-12-05 NOTE — ED Triage Notes (Signed)
Pt to ER for evaluation of sudden onset chest pain left sided radiating through to posterior shoulder. Pt in NAD. Reportedly had STEMI 2 months ago. Pt was given 324 mg aspirin in route and 2 nitro with full relief of pain. Reports with radiation to left arm.

## 2017-12-05 NOTE — H&P (Signed)
Justin [name] is an 84 y.o. male.   Chief Complaint: Chest pain HPI: Justin [name]  is a 84 y.o. male   with PAD (b/l mod carotid stenosis), s/p endovascular repair of 5.5 cm infrarenal abdominal aorta aneurysm 06/03/2016, hypertension, h/o postop Afib in 2013 with no reported recurrence, admitted with NSTEMI on 11/20/2017.  Coronary angiogram on 11/22/2017 showed severe multivessel disease, including severe distal left main calcific bifurcation disease.  Options for treatment were discussed with the patient.  He was deemed to be nonsurgical candidate due to his advanced age, renal failure and progressive dementia and confusion.  He was doing well until this morning had severe chest pain, thought that he was having a "minor one" heart attack just like he did previously.  EMS was activated, he received 2 sublingual nitroglycerin with complete resolution of chest pain, in the emergency room he was being observed with serial troponin, which trended upwards hence I was called to evaluate the patient.  Patient is accompanied by his wife and son at the bedside.  States that he is doing well and essentially asymptomatic.  Denies chest pain, he has not had any dyspnea, PND or orthopnea.  Past Medical History:  Diagnosis Date  . AAA (abdominal aortic aneurysm) ([place])    "we've known about it since ~ 2000"  . Arthritis    in back and hands:  Gout  . Atrial fibrillation ([place])    a. 04/2011 post op  . BPH (benign prostatic hyperplasia)   . Bronchitis    hx of; "once or twice in my life" (04/21/11)  . Cancer ([place])    Basal Cell carcinoma- per Dr Jacquiline Doe office notes  . Carotid artery disease ([place])   . Chronic kidney disease    stage 3 - per Dr Jacquiline Doe office notes  . Chronic lower back pain 1995  . Eczema   . GERD (gastroesophageal reflux disease)   . Gout of big toe    "h/o in both"  . Hiatal hernia   . History of kidney stones   . Hypertension   . Lumbar spondylosis   . Lumbar stenosis     a. s/p  anterolateral decompression and lateral plating 04/21/2011  . Migraine    "left ~ 1980's"  . Stomach ulcer    "years ago; treated w/diet"     Past Surgical History:  Procedure Laterality Date  . ABDOMINAL AORTA STENT  06/03/2016   ABDOMINAL AORTIC ENDOVASCULAR STENT GRAFT insertion (N/A)  . ABDOMINAL AORTIC ENDOVASCULAR STENT GRAFT N/A 06/03/2016   Procedure: ABDOMINAL AORTIC ENDOVASCULAR STENT GRAFT insertion;  Surgeon: Serafina Mitchell, MD;  Location: [facility];  Service: Vascular;  Laterality: N/A;  . ANTERIOR LAT LUMBAR FUSION  04/21/11   w/PEEK cage, allograft, and lateral plate (r)  . ANTERIOR LAT LUMBAR FUSION  04/21/2011   Procedure: ANTERIOR LATERAL LUMBAR FUSION 1 LEVEL;  Surgeon: Erline Levine, MD;  Location: [facility];  Service: Neurosurgery;  Laterality: Right;  RIGHT Lumbar three-four anterolateral decompression with fusion and lateral plating  . APPENDECTOMY  1946  . BACK SURGERY  04/21/11   "today makes the 5th time"  . CATARACT EXTRACTION, BILATERAL    . EYE SURGERY Bilateral    Cataract  . LEFT HEART CATH AND CORONARY ANGIOGRAPHY N/A 11/22/2017   Procedure: LEFT HEART CATH AND CORONARY ANGIOGRAPHY;  Surgeon: Nigel Mormon, MD;  Location: [facility];  Service: Cardiovascular;  Laterality: N/A;  . PROSTATE SURGERY    .  SPINE SURGERY     X's 5   . TONSILLECTOMY     "as a child" and Adneoids    Family History  Problem Relation Age of Onset  . Alzheimer's disease Mother        died @ 81  . COPD Father        died @ 21  . Cancer Father   . Hypertension Father   . Heart disease Father   . Stroke Sister   . Cancer Brother        stomach  . Heart disease Brother        Aneurysm  . Hypertension Son   . Heart disease Son        Aneurysm  . Hypertension Son   . Heart disease Son        Aneurysm  . Anesthesia problems Neg Hx   . Hypotension Neg Hx   . Malignant hyperthermia Neg Hx   . Pseudochol deficiency Neg Hx    Social History:   reports that he quit smoking about 37 years ago. His smoking use included cigarettes. He has a 20.00 pack-year smoking history. He has never used smokeless tobacco. He reports that he does not drink alcohol or use drugs.  Allergies:  Allergies  Allergen Reactions  . Demerol Anaphylaxis  Review of Systems  Constitutional: Negative.  Negative for chills and fever.  HENT: Positive for hearing loss.   Eyes: Negative.   Respiratory: Negative.   Cardiovascular: Positive for chest pain. Negative for palpitations, orthopnea, leg swelling and PND.  Gastrointestinal: Negative.   Genitourinary: Negative.   Musculoskeletal: Negative.   Neurological:       Mild dementia present. No focal deficits  Psychiatric/Behavioral: Negative.   All other systems reviewed and are negative.  Blood pressure (!) 112/52, pulse (!) 117, temperature 97.8 F (36.6 C), temperature source Oral, resp. rate (!) 25, SpO2 94 %.  Physical Exam  Constitutional: He is oriented to person, place, and time. He appears well-developed and well-nourished. No distress.  HENT:  Head: Atraumatic.  Eyes: Conjunctivae are normal.  Neck: Normal range of motion. Neck supple.  Cardiovascular: Normal rate, regular rhythm and normal heart sounds.  No murmur heard. Pulses:      Carotid pulses are 3+ on the right side, and 3+ on the left side.      Radial pulses are 3+ on the right side, and 3+ on the left side.       Femoral pulses are 3+ on the right side, and 3+ on the left side.      Popliteal pulses are 3+ on the right side, and 2+ on the left side.       Dorsalis pedis pulses are 3+ on the right side, and 1+ on the left side.       Posterior tibial pulses are 3+ on the right side, and 1+ on the left side.  Pulmonary/Chest: Effort normal and breath sounds normal.  Abdominal: Soft. Bowel sounds are normal.  Musculoskeletal: Normal range of motion. He exhibits no edema, tenderness or deformity.  Neurological: He is alert and  oriented to person, place, and time.  Skin: Skin is warm and dry.  Psychiatric: He has a normal mood and affect.    Results for orders placed or performed during the hospital encounter of 12/05/17 (from the past 48 hour(s))  CBC with Differential/Platelet     Status: Abnormal   Collection Time: 12/05/17 10:11 AM  Result Value Ref Range  WBC 6.0 4.0 - 10.5 K/uL   RBC 3.63 (L) 4.22 - 5.81 MIL/uL   Hemoglobin 11.5 (L) 13.0 - 17.0 g/dL   HCT 35.7 (L) 39.0 - 52.0 %   MCV 98.3 80.0 - 100.0 fL   MCH 31.7 26.0 - 34.0 pg   MCHC 32.2 30.0 - 36.0 g/dL   RDW 12.4 11.5 - 15.5 %   Platelets 176 150 - 400 K/uL   nRBC 0.0 0.0 - 0.2 %   Neutrophils Relative % 77 %   Neutro Abs 4.6 1.7 - 7.7 K/uL   Lymphocytes Relative 13 %   Lymphs Abs 0.8 0.7 - 4.0 K/uL   Monocytes Relative 8 %   Monocytes Absolute 0.5 0.1 - 1.0 K/uL   Eosinophils Relative 1 %   Eosinophils Absolute 0.1 0.0 - 0.5 K/uL   Basophils Relative 1 %   Basophils Absolute 0.0 0.0 - 0.1 K/uL   Immature Granulocytes 0 %   Abs Immature Granulocytes 0.02 0.00 - 0.07 K/uL    Comment: Performed at [facility] [address], [place], [place] [address]  I-stat troponin, ED     Status: Abnormal   Collection Time: 12/05/17 10:14 AM  Result Value Ref Range   Troponin i, poc 0.14 (HH) 0.00 - 0.08 ng/mL   Comment NOTIFIED PHYSICIAN    Comment 3            Comment: Due to the release kinetics of cTnI, a negative result within the first hours of the onset of symptoms does not rule out myocardial infarction with certainty. If myocardial infarction is still suspected, repeat the test at appropriate intervals.   I-stat chem 8, ed     Status: Abnormal   Collection Time: 12/05/17 10:16 AM  Result Value Ref Range   Sodium 143 135 - 145 mmol/L   Potassium 3.7 3.5 - 5.1 mmol/L   Chloride 110 98 - 111 mmol/L   BUN 23 8 - 23 mg/dL   Creatinine, Ser 1.30 (H) 0.61 - 1.24 mg/dL   Glucose, Bld 117 (H) 70 - 99 mg/dL   Calcium, Ion 1.16 1.15  - 1.40 mmol/L   TCO2 22 22 - 32 mmol/L   Hemoglobin 10.5 (L) 13.0 - 17.0 g/dL   HCT 31.0 (L) 39.0 - 52.0 %  I-stat troponin, ED     Status: Abnormal   Collection Time: 12/05/17  1:26 PM  Result Value Ref Range   Troponin i, poc 0.72 (HH) 0.00 - 0.08 ng/mL   Comment NOTIFIED PHYSICIAN    Comment 3            Comment: Due to the release kinetics of cTnI, a negative result within the first hours of the onset of symptoms does not rule out myocardial infarction with certainty. If myocardial infarction is still suspected, repeat the test at appropriate intervals.   I-stat troponin, ED     Status: Abnormal   Collection Time: 12/05/17  3:04 PM  Result Value Ref Range   Troponin i, poc 0.99 (HH) 0.00 - 0.08 ng/mL   Comment NOTIFIED PHYSICIAN    Comment 3            Comment: Due to the release kinetics of cTnI, a negative result within the first hours of the onset of symptoms does not rule out myocardial infarction with certainty. If myocardial infarction is still suspected, repeat the test at appropriate intervals.     Labs:   Lab Results  Component Value Date  WBC 6.0 12/05/2017   HGB 10.5 (L) 12/05/2017   HCT 31.0 (L) 12/05/2017   MCV 98.3 12/05/2017   PLT 176 12/05/2017   Recent Labs  Lab 12/05/17 1016  NA 143  K 3.7  CL 110  BUN 23  CREATININE 1.30*  GLUCOSE 117*    Lipid Panel     Component Value Date/Time   CHOL 180 11/21/2017 0652   TRIG 148 11/21/2017 0652   HDL 38 (L) 11/21/2017 0652   CHOLHDL 4.7 11/21/2017 0652   VLDL 30 11/21/2017 0652   LDLCALC 112 (H) 11/21/2017 0652  HEMOGLOBIN A1C Lab Results  Component Value Date   HGBA1C 6.0 (H) 11/21/2017   MPG 125.5 11/21/2017   Troponin (Point of Care Test) Recent Labs    12/05/17 1504  TROPIPOC 0.99*    No current facility-administered medications for this encounter.   Current Outpatient Medications:  .  acetaminophen (TYLENOL) 500 MG tablet, Take 1,000 mg by mouth daily as needed (back  pain)., Disp: , Rfl:  .  aspirin EC 81 MG tablet, Take 81 mg by mouth daily. , Disp: , Rfl:  .  brimonidine (ALPHAGAN) 0.2 % ophthalmic solution, Place 1 drop into both eyes 2 (two) times daily. , Disp: , Rfl:  .  clopidogrel (PLAVIX) 75 MG tablet, Take 1 tablet (75 mg total) by mouth daily., Disp: 30 tablet, Rfl: 3 .  donepezil (ARICEPT) 5 MG tablet, Take 5 mg by mouth daily., Disp: , Rfl: 4 .  dorzolamide-timolol (COSOPT) 22.3-6.8 MG/ML ophthalmic solution, Place 1 drop into both eyes 2 (two) times daily. , Disp: , Rfl:  .  dutasteride (AVODART) 0.5 MG capsule, Take 0.5 mg by mouth daily. , Disp: , Rfl:  .  metoprolol succinate (TOPROL-XL) 25 MG 24 hr tablet, Take 1 tablet (25 mg total) by mouth daily., Disp: 30 tablet, Rfl: 3 .  nitroGLYCERIN (NITROSTAT) 0.4 MG SL tablet, Place 1 tablet (0.4 mg total) under the tongue every 5 (five) minutes x 3 doses as needed for chest pain., Disp: 30 tablet, Rfl: 3 .  rosuvastatin (CRESTOR) 10 MG tablet, Take 1 tablet (10 mg total) by mouth daily at 6 PM., Disp: 30 tablet, Rfl: 3 .  traMADol (ULTRAM) 50 MG tablet, Take 50 mg by mouth daily., Disp: , Rfl: 0 .  TRAVATAN Z 0.004 % SOLN ophthalmic solution, Place 1 drop into the left eye See admin instructions. On odd days, Disp: , Rfl:  .  bisacodyl (DULCOLAX) 5 MG EC tablet, Take 1 tablet (5 mg total) by mouth daily as needed for moderate constipation. (Patient not taking: Reported on 07/20/2017), Disp: 30 tablet, Rfl: 0  CARDIAC STUDIES:  EKG 12/05/2017: Normal sinus rhythm with rate of 94 bpm, normal axis.  1 mm upsloping ST depression in anterolateral leads, cannot exclude anterolateral ischemia.  PVCs.  Compared to EKG done 11/20/2017: Anterolateral ST depression not present.  Echocardiogram 11/21/2017: Normal LV systolic function, EF 55 to 60%.  Mild calcific mitral stenosis, mean gradient 4 mmHg, valve area 1.8 cm.  Mild tricuspid regurgitation.  Coronary angiogram 11/22/2017:  LM: Distal LM  bifurcation has severely calcified 95% stenosis.  LAD: Large diag 1 with ostial 95% stenosis.  LAD/Diag 2 bifurcation 90% stenosis. Mid diag 2 60% disease. Rest of the LAD looks fairly normal with good surgical targets on LAD as well as diag vessels.  LCx: Prox LCx tortuous with 70% disease. Moderate sized OM 1 with long 95% stenosis with good surgical targets.  RCA: Prox-mid  RCA 50% irregular disease.  Normal LVEDP  Assessment/Plan 1.  Non-STEMI in a patient with known severe triple-vessel coronary artery disease, not amenable for revascularization, both percutaneous and surgical. 2.  Asymptomatic bilateral carotid artery disease, moderate. 3.  History of AAA repair. 4.  Essential hypertension 5.  Chronic stage III kidney disease, serum creatinine stable 6.  Dementia and advanced age 52.  Hyperlipidemia, mild 8.  Mild chronic thrombocytopenia  Recommendation: I had a lengthy discussion with the patient and also his wife and son at the bedside.  Patient had wished to be DNR so does his wife.  I have discussed with them that we should keep him overnight for observation, as he is chest pain-free, watch him overnight and discharge him in the morning.  I will discontinue clopidogrel and switch him to Brilinta in view of ACS for medical therapy.  I will also add Ranexa for triple-vessel coronary artery disease.  He does have mild anemia since hospitalization.  He also has mild chronic thrombocytopenia.  Hence bleeding complication need to be kept in mind.  Blood pressure is well controlled.  Due to PVCs and also underlying sinus tachycardia, I will increase his beta-blocker metoprolol to 2 times daily dosing of metoprolol succinate 25 mg.  Adrian Prows, MD 12/05/2017, 3:48 PM [facility]. [place] Pager: [phone] Office: [phone] If no answer: Cell:  [phone]

## 2017-12-05 NOTE — ED Notes (Signed)
Pt able to stand at bedside to use urinal with one assist

## 2017-12-05 NOTE — ED Provider Notes (Addendum)
[facility] EMERGENCY DEPARTMENT Provider Note   CSN: [id number] Arrival date & time: 12/05/17  0901     History   Chief Complaint Chief Complaint  Patient presents with  . Chest Pain  Of a 5 caveat dementia.  History is obtained from patient, from patient's wife and his son who accompany him  HPI Justin [name] is a 84 y.o. male.  Patient developed anterior chest pain approximately 7 AM today, left-sided.  He told his wife that it felt like "heart pain" he had in the past.  He received no treatment prior to calling EMS.  EMS administered 2 sublingual nitroglycerin tablets and 324 mg of aspirin and symptoms resolved.  He is presently asymptomatic and feels well.  Denies any shortness of breath nausea sweatiness or lightheadedness.  No other associated symptoms  HPI  Past Medical History:  Diagnosis Date  . AAA (abdominal aortic aneurysm) ([place])    "we've known about it since ~ 2000"  . Arthritis    in back and hands:  Gout  . Atrial fibrillation ([place])    a. 04/2011 post op  . BPH (benign prostatic hyperplasia)   . Bronchitis    hx of; "once or twice in my life" (04/21/11)  . Cancer ([place])    Basal Cell carcinoma- per Dr Jacquiline Doe office notes  . Carotid artery disease ([place])   . Chronic kidney disease    stage 3 - per Dr Jacquiline Doe office notes  . Chronic lower back pain 1995  . Eczema   . GERD (gastroesophageal reflux disease)   . Gout of big toe    "h/o in both"  . Hiatal hernia   . History of kidney stones   . Hypertension   . Lumbar spondylosis   . Lumbar stenosis    a. s/p  anterolateral decompression and lateral plating 04/21/2011  . Migraine    "left ~ 1980's"  . Stomach ulcer    "years ago; treated w/diet"     Patient Active Problem List   Diagnosis Date Noted  . Coronary artery disease 11/24/2017  . Dementia ([place]) 11/24/2017  . Essential hypertension 11/24/2017  . Carotid artery stenosis, asymptomatic, bilateral 11/24/2017  . NSTEMI  (non-ST elevated myocardial infarction) ([place]) 11/20/2017  . S/P AAA repair 06/03/2016  . Atrial fibrillation ([employer]) 04/23/2011  . Abdominal aneurysm without mention of rupture 02/23/2011    Past Surgical History:  Procedure Laterality Date  . ABDOMINAL AORTA STENT  06/03/2016   ABDOMINAL AORTIC ENDOVASCULAR STENT GRAFT insertion (N/A)  . ABDOMINAL AORTIC ENDOVASCULAR STENT GRAFT N/A 06/03/2016   Procedure: ABDOMINAL AORTIC ENDOVASCULAR STENT GRAFT insertion;  Surgeon: Serafina Mitchell, MD;  Location: [facility];  Service: Vascular;  Laterality: N/A;  . ANTERIOR LAT LUMBAR FUSION  04/21/11   w/PEEK cage, allograft, and lateral plate (r)  . ANTERIOR LAT LUMBAR FUSION  04/21/2011   Procedure: ANTERIOR LATERAL LUMBAR FUSION 1 LEVEL;  Surgeon: Erline Levine, MD;  Location: [facility];  Service: Neurosurgery;  Laterality: Right;  RIGHT Lumbar three-four anterolateral decompression with fusion and lateral plating  . APPENDECTOMY  1946  . BACK SURGERY  04/21/11   "today makes the 5th time"  . CATARACT EXTRACTION, BILATERAL    . EYE SURGERY Bilateral    Cataract  . LEFT HEART CATH AND CORONARY ANGIOGRAPHY N/A 11/22/2017   Procedure: LEFT HEART CATH AND CORONARY ANGIOGRAPHY;  Surgeon: Nigel Mormon, MD;  Location: [facility];  Service: Cardiovascular;  Laterality: N/A;  .  PROSTATE SURGERY    . SPINE SURGERY     X's 5   . TONSILLECTOMY     "as a child" and Adneoids        Home Medications    Prior to Admission medications   Medication Sig Start Date End Date Taking? Authorizing Provider  acetaminophen (TYLENOL) 500 MG tablet Take 1,000 mg by mouth daily as needed (back pain).    [provider]  aspirin EC 81 MG tablet Take 81 mg by mouth daily.     [provider]  bisacodyl (DULCOLAX) 5 MG EC tablet Take 1 tablet (5 mg total) by mouth daily as needed for moderate constipation. Patient not taking: Reported on 07/20/2017 06/04/16   Virgina Jock A, PA-C    brimonidine (ALPHAGAN) 0.2 % ophthalmic solution Place 1 drop into both eyes 2 (two) times daily.  04/10/16   [provider]  clopidogrel (PLAVIX) 75 MG tablet Take 1 tablet (75 mg total) by mouth daily. 11/24/17   Patwardhan, Manish J, MD  donepezil (ARICEPT) 5 MG tablet Take 5 mg by mouth daily. 11/07/17   [provider]  dorzolamide-timolol (COSOPT) 22.3-6.8 MG/ML ophthalmic solution Place 1 drop into both eyes 2 (two) times daily.     [provider]  dutasteride (AVODART) 0.5 MG capsule Take 0.5 mg by mouth daily.     [provider]  LUMIGAN 0.01 % SOLN Place 1 drop into both eyes every other day.  01/20/14   [provider]  metoprolol succinate (TOPROL-XL) 25 MG 24 hr tablet Take 1 tablet (25 mg total) by mouth daily. 11/24/17   Patwardhan, Reynold Bowen, MD  nitroGLYCERIN (NITROSTAT) 0.4 MG SL tablet Place 1 tablet (0.4 mg total) under the tongue every 5 (five) minutes x 3 doses as needed for chest pain. 11/23/17   Patwardhan, Reynold Bowen, MD  rosuvastatin (CRESTOR) 10 MG tablet Take 1 tablet (10 mg total) by mouth daily at 6 PM. 11/24/17   Patwardhan, Manish J, MD  traMADol (ULTRAM) 50 MG tablet Take 50 mg by mouth daily. 09/16/17   [provider]  TRAVATAN Z 0.004 % SOLN ophthalmic solution Place 1 drop into the left eye See admin instructions. On odd days 07/19/17   [provider]  White Petrolatum-Mineral Oil (LUBRICANT EYE OP) Place 1 drop into both eyes daily as needed (dry eye).    [provider]    Family History Family History  Problem Relation Age of Onset  . Alzheimer's disease Mother        died @ 88  . COPD Father        died @ 60  . Cancer Father   . Hypertension Father   . Heart disease Father   . Stroke Sister   . Cancer Brother        stomach  . Heart disease Brother        Aneurysm  . Hypertension Son   . Heart disease Son        Aneurysm  . Hypertension Son   . Heart disease Son         Aneurysm  . Anesthesia problems Neg Hx   . Hypotension Neg Hx   . Malignant hyperthermia Neg Hx   . Pseudochol deficiency Neg Hx     Social History Social History   Tobacco Use  . Smoking status: Former Smoker    Packs/day: 1.00    Years: 20.00    Pack years: 20.00  Types: Cigarettes    Last attempt to quit: 02/10/1980    Years since quitting: 37.8  . Smokeless tobacco: Never Used  Substance Use Topics  . Alcohol use: No  . Drug use: No     Allergies   Demerol   Review of Systems Review of Systems  Unable to perform ROS: Dementia  Cardiovascular: Positive for chest pain.     Physical Exam Updated Vital Signs BP (!) 146/87 (BP Location: Right Arm)   Pulse 89   Temp 97.8 F (36.6 C) (Oral)   Resp 18   SpO2 98%   Physical Exam  Constitutional: He appears well-developed and well-nourished.  HENT:  Head: Normocephalic and atraumatic.  Eyes: Pupils are equal, round, and reactive to light. Conjunctivae are normal.  Neck: Neck supple. No tracheal deviation present. No thyromegaly present.  Cardiovascular: Normal rate, regular rhythm and intact distal pulses.  No murmur heard. Pulmonary/Chest: Effort normal and breath sounds normal.  Abdominal: Soft. Bowel sounds are normal. He exhibits no distension. There is no tenderness.  Musculoskeletal: Normal range of motion. He exhibits no edema or tenderness.  Neurological: He is alert. Coordination normal.  Skin: Skin is warm and dry. No rash noted.  Psychiatric: He has a normal mood and affect.  Nursing note and vitals reviewed.    ED Treatments / Results  Labs (all labs ordered are listed, but only abnormal results are displayed) Labs Reviewed  CBC WITH DIFFERENTIAL/PLATELET  I-STAT CHEM 8, ED  I-STAT TROPONIN, ED    EKG EKG Interpretation  Date/Time:  Sunday December 05 2017 09:08:40 EDT Ventricular Rate:  94 PR Interval:    QRS Duration: 95 QT Interval:  380 QTC Calculation: 476 R Axis:   -3 Text  Interpretation:  Sinus rhythm Ventricular premature complex Probable left atrial enlargement Borderline ST depression, anterolateral leads Borderline prolonged QT interval No significant change since last tracing Confirmed by Orlie Dakin [phone]) on 12/05/2017 9:30:14 AM   Radiology No results found.  Procedures Procedures (including critical care time)  Medications Ordered in ED Medications - No data to display 1:30 PM patient remains asymptomatic.  Second troponin elevated.  Dr.Ganji consulted by telephone.  Request third troponin to be drawn in 1 to 2 hours.  If continues to climb he will evaluate patient in ED.  3:15 PM patient continues to remain asymptomatic.  Third troponin continues to climb.  Dr. Einar Gip consulted and will see patient in the ED.  Results for orders placed or performed during the hospital encounter of 12/05/17  CBC with Differential/Platelet  Result Value Ref Range   WBC 6.0 4.0 - 10.5 K/uL   RBC 3.63 (L) 4.22 - 5.81 MIL/uL   Hemoglobin 11.5 (L) 13.0 - 17.0 g/dL   HCT 35.7 (L) 39.0 - 52.0 %   MCV 98.3 80.0 - 100.0 fL   MCH 31.7 26.0 - 34.0 pg   MCHC 32.2 30.0 - 36.0 g/dL   RDW 12.4 11.5 - 15.5 %   Platelets 176 150 - 400 K/uL   nRBC 0.0 0.0 - 0.2 %   Neutrophils Relative % 77 %   Neutro Abs 4.6 1.7 - 7.7 K/uL   Lymphocytes Relative 13 %   Lymphs Abs 0.8 0.7 - 4.0 K/uL   Monocytes Relative 8 %   Monocytes Absolute 0.5 0.1 - 1.0 K/uL   Eosinophils Relative 1 %   Eosinophils Absolute 0.1 0.0 - 0.5 K/uL   Basophils Relative 1 %   Basophils Absolute 0.0 0.0 - 0.1  K/uL   Immature Granulocytes 0 %   Abs Immature Granulocytes 0.02 0.00 - 0.07 K/uL  I-stat chem 8, ed  Result Value Ref Range   Sodium 143 135 - 145 mmol/L   Potassium 3.7 3.5 - 5.1 mmol/L   Chloride 110 98 - 111 mmol/L   BUN 23 8 - 23 mg/dL   Creatinine, Ser 1.30 (H) 0.61 - 1.24 mg/dL   Glucose, Bld 117 (H) 70 - 99 mg/dL   Calcium, Ion 1.16 1.15 - 1.40 mmol/L   TCO2 22 22 - 32 mmol/L    Hemoglobin 10.5 (L) 13.0 - 17.0 g/dL   HCT 31.0 (L) 39.0 - 52.0 %  I-stat troponin, ED  Result Value Ref Range   Troponin i, poc 0.14 (HH) 0.00 - 0.08 ng/mL   Comment NOTIFIED PHYSICIAN    Comment 3          I-stat troponin, ED  Result Value Ref Range   Troponin i, poc 0.72 (HH) 0.00 - 0.08 ng/mL   Comment NOTIFIED PHYSICIAN    Comment 3          I-stat troponin, ED  Result Value Ref Range   Troponin i, poc 0.99 (HH) 0.00 - 0.08 ng/mL   Comment NOTIFIED PHYSICIAN    Comment 3           Dg Chest 2 View  Result Date: 11/20/2017 CLINICAL DATA:  Acute onset chest pain 1 hour ago. EXAM: CHEST - 2 VIEW COMPARISON:  06/03/2016 FINDINGS: The heart size and mediastinal contours are within normal limits. Aortic atherosclerosis. Both lungs are clear. Pulmonary hyperinflation again seen, suspicious for COPD. Mild thoracic spine degenerative changes noted. IMPRESSION: Suspect COPD.  No active cardiopulmonary disease. Electronically Signed   By: Earle Gell M.D.   On: 11/20/2017 19:42   Initial Impression / Assessment and Plan / ED Course  I have reviewed the triage vital signs and the nursing notes.  Pertinent labs & imaging results that were available during my care of the patient were reviewed by me and considered in my medical decision making (see chart for details).     Consistent with mild anemia, mild renal insufficiency and NSTEMI  Final Clinical Impressions(s) / ED Diagnoses  Dx#1 NSTEMI Final diagnoses:  None  #2 anemia #3 renal insufficiency CRITICAL CARE Performed by: Orlie Dakin Total critical care time: 45 minutes Critical care time was exclusive of separately billable procedures and treating other patients. Critical care was necessary to treat or prevent imminent or life-threatening deterioration. Critical care was time spent personally by me on the following activities: development of treatment plan with patient and/or surrogate as well as nursing, discussions with  consultants, evaluation of patient's response to treatment, examination of patient, obtaining history from patient or surrogate, ordering and performing treatments and interventions, ordering and review of laboratory studies, ordering and review of radiographic studies, pulse oximetry and re-evaluation of patient's condition. ED Discharge Orders    None       Orlie Dakin, MD 12/05/17 [place], Geraldine, MD 12/05/17 [phone]

## 2017-12-05 NOTE — Progress Notes (Signed)
CRITICAL VALUE ALERT  Critical Value:  Troponin I 2.38  Date & Time Notied:  12/05/2017 1800  Provider Notified: Adrian Prows, MD   Orders Received/Actions taken: no orders received. Pt currently chest pain free, HR 109, BP 112/71, 98% O2 room air. Will continue to monitor pt.

## 2017-12-05 NOTE — ED Notes (Signed)
Attempted report x1. 

## 2017-12-05 NOTE — ED Notes (Signed)
EDP aware of patient's tachycardia

## 2017-12-05 NOTE — ED Notes (Signed)
Gave pt and wife sandwich bags, per RN.

## 2017-12-06 ENCOUNTER — Other Ambulatory Visit: Payer: Self-pay

## 2017-12-06 DIAGNOSIS — Z87891 Personal history of nicotine dependence: Secondary | ICD-10-CM | POA: Diagnosis not present

## 2017-12-06 DIAGNOSIS — I2510 Atherosclerotic heart disease of native coronary artery without angina pectoris: Secondary | ICD-10-CM | POA: Diagnosis not present

## 2017-12-06 DIAGNOSIS — I214 Non-ST elevation (NSTEMI) myocardial infarction: Secondary | ICD-10-CM | POA: Diagnosis not present

## 2017-12-06 DIAGNOSIS — Z85828 Personal history of other malignant neoplasm of skin: Secondary | ICD-10-CM | POA: Diagnosis not present

## 2017-12-06 DIAGNOSIS — Z7982 Long term (current) use of aspirin: Secondary | ICD-10-CM | POA: Diagnosis not present

## 2017-12-06 DIAGNOSIS — I129 Hypertensive chronic kidney disease with stage 1 through stage 4 chronic kidney disease, or unspecified chronic kidney disease: Secondary | ICD-10-CM | POA: Diagnosis not present

## 2017-12-06 DIAGNOSIS — I6523 Occlusion and stenosis of bilateral carotid arteries: Secondary | ICD-10-CM | POA: Diagnosis not present

## 2017-12-06 DIAGNOSIS — I714 Abdominal aortic aneurysm, without rupture: Secondary | ICD-10-CM | POA: Diagnosis not present

## 2017-12-06 DIAGNOSIS — D649 Anemia, unspecified: Secondary | ICD-10-CM | POA: Diagnosis not present

## 2017-12-06 DIAGNOSIS — N183 Chronic kidney disease, stage 3 (moderate): Secondary | ICD-10-CM | POA: Diagnosis not present

## 2017-12-06 DIAGNOSIS — F0390 Unspecified dementia without behavioral disturbance: Secondary | ICD-10-CM | POA: Diagnosis not present

## 2017-12-06 DIAGNOSIS — Z7902 Long term (current) use of antithrombotics/antiplatelets: Secondary | ICD-10-CM | POA: Diagnosis not present

## 2017-12-06 LAB — TROPONIN I
TROPONIN I: 2.16 ng/mL — AB (ref <0.03)
Troponin I: 1.53 ng/mL (ref <0.03)

## 2017-12-06 LAB — IRON AND TIBC
Iron: 48 ug/dL (ref 45–182)
SATURATION RATIOS: 18 % (ref 17.9–39.5)
TIBC: 272 ug/dL (ref 250–450)
UIBC: 224 ug/dL

## 2017-12-06 MED ORDER — FERROUS SULFATE 325 (65 FE) MG PO TABS: 325.0000 mg | ORAL_TABLET | Freq: Two times a day (BID) | ORAL | 2 refills | Status: DC

## 2017-12-06 MED ORDER — RANOLAZINE ER 500 MG PO TB12: 500.0000 mg | ORAL_TABLET | Freq: Two times a day (BID) | ORAL | 2 refills | Status: DC

## 2017-12-06 MED ORDER — FERROUS SULFATE 325 (65 FE) MG PO TABS
325.0000 mg | ORAL_TABLET | Freq: Two times a day (BID) | ORAL | Status: DC
  Administered 2017-12-06: 325 mg via ORAL
  Filled 2017-12-06: qty 1

## 2017-12-06 MED ORDER — DOCUSATE SODIUM 100 MG PO CAPS: 100.0000 mg | ORAL_CAPSULE | Freq: Two times a day (BID) | ORAL | 2 refills | Status: DC | PRN

## 2017-12-06 MED ORDER — DOCUSATE SODIUM 100 MG PO CAPS: 100.0000 mg | ORAL_CAPSULE | Freq: Two times a day (BID) | ORAL | Status: DC | PRN

## 2017-12-06 MED ORDER — CLOPIDOGREL BISULFATE 75 MG PO TABS
75.0000 mg | ORAL_TABLET | Freq: Every day | ORAL | Status: DC
  Administered 2017-12-06: 75 mg via ORAL
  Filled 2017-12-06: qty 1

## 2017-12-06 NOTE — Discharge Instructions (Signed)
Ticagrelor oral tablet What is this medicine? TICAGRELOR (TYE ka GREL or) helps to prevent blood clots. This medicine is used to prevent heart attack, stroke, or other vascular events in people who have had a recent heart attack or who have severe chest pain. This medicine may be used for other purposes; ask your health care provider or pharmacist if you have questions. COMMON BRAND NAME(S): BRILINTA What should I tell my health care provider before I take this medicine? They need to know if you have any of these conditions: -bleeding disorders -bleeding in the brain -having surgery -history of irregular heartbeat -history of stomach bleeding -liver disease -an unusual or allergic reaction to ticagrelor, other medicines, foods, dyes, or preservatives -pregnant or trying to get pregnant -breast-feeding How should I use this medicine? Take this medicine by mouth with a glass of water. Follow the directions on the prescription label. You can take it with or without food. If it upsets your stomach, take it with food. Take your medicine at regular intervals. Do not take it more often than directed. Do not stop taking except on your doctor's advice. Talk to you pediatrician regarding the use of this medicine in children. Special care may be needed. Overdosage: If you think you have taken too much of this medicine contact a poison control center or emergency room at once. NOTE: This medicine is only for you. Do not share this medicine with others. What if I miss a dose? If you miss a dose, take it as soon as you can. If it is almost time for your next dose, take only that dose. Do not take double or extra doses. What may interact with this medicine? -certain antibiotics like clarithromycin and telithromycin -certain medicines for fungal infections like itraconazole, ketoconazole, and voriconazole -certain medicines for HIV infection like atazanavir, indinavir, nelfinavir, ritonavir, and  saquinavir -certain medicines for seizures like carbamazepine, phenobarbital, and phenytoin -certain medicines that treat or prevent blood clots like warfarin -dexamethasone -digoxin -lovastatin -nefazodone -rifampin -simvastatin This list may not describe all possible interactions. Give your health care provider a list of all the medicines, herbs, non-prescription drugs, or dietary supplements you use. Also tell them if you smoke, drink alcohol, or use illegal drugs. Some items may interact with your medicine. What should I watch for while using this medicine? Visit your doctor or health care professional for regular check ups. Do not stop taking you medicine unless your doctor tells you to. Notify your doctor or health care professional and seek emergency treatment if you develop breathing problems; changes in vision; chest pain; severe, sudden headache; pain, swelling, warmth in the leg; trouble speaking; sudden numbness or weakness of the face, arm, or leg. These can be signs that your condition has gotten worse. If you are going to have surgery or dental work, tell your doctor or health care professional that you are taking this medicine. You should take aspirin every day with this medicine. Do not take more than 100 mg each day. Talk to your doctor if you have questions. What side effects may I notice from receiving this medicine? Side effects that you should report to your doctor or health care professional as soon as possible: -allergic reactions like skin rash, itching or hives, swelling of the face, lips, or tongue -breathing problems -fast or irregular heartbeat -feeling faint or light-headed, falls -signs and symptoms of bleeding such as bloody or black, tarry stools; red or dark-brown urine; spitting up blood or brown material that looks  like coffee grounds; red spots on the skin; unusual bruising or bleeding from the eye, gums, or nose Side effects that usually do not require  medical attention (report to your doctor or health care professional if they continue or are bothersome): -breast enlargement in both males and females -diarrhea -dizziness -headache -tiredness -upset stomach This list may not describe all possible side effects. Call your doctor for medical advice about side effects. You may report side effects to FDA at 1-800-FDA-1088. Where should I keep my medicine? Keep out of the reach of children. Store at room temperature of 59 to 86 degrees F (15 to 30 degrees C). Throw away any unused medicine after the expiration date. NOTE: This sheet is a summary. It may not cover all possible information. If you have questions about this medicine, talk to your doctor, pharmacist, or health care provider.  2018 [employer]vier/Gold Standard (2015-02-28 11:53:14)

## 2017-12-06 NOTE — Discharge Summary (Signed)
Physician Discharge Summary  Patient ID: Justin [name] MRN: [id number] DOB/AGE: [birth year] 84 y.o.  Admit date: 12/05/2017 Discharge date: 12/06/2017  Admission Diagnoses: Chest pain   NSTEMI (non-ST elevated myocardial infarction) ([place])   S/P AAA repair   Coronary artery disease   Dementia ([place])   Carotid artery stenosis, asymptomatic, bilateral  Discharge Diagnoses:  Active Problems:   S/P AAA repair   NSTEMI (non-ST elevated myocardial infarction) ([place])   Coronary artery disease   Dementia ([facility])   Carotid artery stenosis, asymptomatic, bilateral   Anemia   Discharged Condition: stable  Hospital Course:   84 y/o male with severe multivessel CAD, NSTEMI in 11/2017, PAD (b/l mod carotid stenosis), s/p endovascular repair of 5.5 cm infrarenal abdominal aorta aneurysm 06/03/2016, hypertension, h/o postop Afib in 2013 with no reported recurrence, admitted with chest pain.  Patient had NSTEMI with peak trop 2.3, which was medically managed. Patient is currently chest pain free. He does have anemia with Hb 10.5, down from baseline of 13-14 g/dL. He denies any melena. I suspect his anemia is due to recent medical illness. I do not think he is candidate for any invasive GI workup due to his CAD. Pending iron labs, I have started him iron supplements and stool softener. With his anemia and advanced age, I have changed his Briinta back to plavix given bleeding risk. I have also started him on Ranexa 500 mg bid as anti anginal medication. With his age and borderline blood pressure I would like to avoid Imdur  Given his advanced disease not amenable to revascularization, he is now DNR as per patient and family wishes. Patient is still able to ambulate and should be able to go home with adequate social support. Patient will be evaluated by physical therapy for discharge.   Consults: Physical therapy  Significant Diagnostic Studies:  Labs Results for Justin, [name] (MRN [id number]) as of  12/06/2017 09:24  Ref. Range 11/24/2017 02:35 12/05/2017 10:11 12/05/2017 10:16  WBC Latest Ref Range: 4.0 - 10.5 K/uL 6.6 6.0   RBC Latest Ref Range: 4.22 - 5.81 MIL/uL 3.78 (L) 3.63 (L)   Hemoglobin Latest Ref Range: 13.0 - 17.0 g/dL 12.1 (L) 11.5 (L) 10.5 (L)  HCT Latest Ref Range: 39.0 - 52.0 % 36.5 (L) 35.7 (L) 31.0 (L)  MCV Latest Ref Range: 80.0 - 100.0 fL 96.6 98.3   MCH Latest Ref Range: 26.0 - 34.0 pg 32.0 31.7   MCHC Latest Ref Range: 30.0 - 36.0 g/dL 33.2 32.2   RDW Latest Ref Range: 11.5 - 15.5 % 12.2 12.4   Platelets Latest Ref Range: 150 - 400 K/uL 192 176   nRBC Latest Ref Range: 0.0 - 0.2 % 0.0 0.0   Neutrophils Latest Units: %  77   Lymphocytes Latest Units: %  13   Monocytes Relative Latest Units: %  8   Eosinophil Latest Units: %  1   Basophil Latest Units: %  1   Immature Granulocytes Latest Units: %  0   NEUT# Latest Ref Range: 1.7 - 7.7 K/uL  4.6   Lymphocyte # Latest Ref Range: 0.7 - 4.0 K/uL  0.8   Monocyte # Latest Ref Range: 0.1 - 1.0 K/uL  0.5   Eosinophils Absolute Latest Ref Range: 0.0 - 0.5 K/uL  0.1   Basophils Absolute Latest Ref Range: 0.0 - 0.1 K/uL  0.0   Abs Immature Granulocytes Latest Ref Range: 0.00 - 0.07 K/uL  0.02    Results for [name], Justin  D (MRN [id number]) as of 12/06/2017 09:24  Ref. Range 11/24/2017 02:35 12/05/2017 10:11 12/05/2017 10:16  WBC Latest Ref Range: 4.0 - 10.5 K/uL 6.6 6.0   RBC Latest Ref Range: 4.22 - 5.81 MIL/uL 3.78 (L) 3.63 (L)   Hemoglobin Latest Ref Range: 13.0 - 17.0 g/dL 12.1 (L) 11.5 (L) 10.5 (L)  HCT Latest Ref Range: 39.0 - 52.0 % 36.5 (L) 35.7 (L) 31.0 (L)  MCV Latest Ref Range: 80.0 - 100.0 fL 96.6 98.3   MCH Latest Ref Range: 26.0 - 34.0 pg 32.0 31.7   MCHC Latest Ref Range: 30.0 - 36.0 g/dL 33.2 32.2   RDW Latest Ref Range: 11.5 - 15.5 % 12.2 12.4   Platelets Latest Ref Range: 150 - 400 K/uL 192 176   nRBC Latest Ref Range: 0.0 - 0.2 % 0.0 0.0   Neutrophils Latest Units: %  77   Lymphocytes Latest  Units: %  13   Monocytes Relative Latest Units: %  8   Eosinophil Latest Units: %  1   Basophil Latest Units: %  1   Immature Granulocytes Latest Units: %  0   NEUT# Latest Ref Range: 1.7 - 7.7 K/uL  4.6   Lymphocyte # Latest Ref Range: 0.7 - 4.0 K/uL  0.8   Monocyte # Latest Ref Range: 0.1 - 1.0 K/uL  0.5   Eosinophils Absolute Latest Ref Range: 0.0 - 0.5 K/uL  0.1   Basophils Absolute Latest Ref Range: 0.0 - 0.1 K/uL  0.0   Abs Immature Granulocytes Latest Ref Range: 0.00 - 0.07 K/uL  0.02    Treatments: Medical management for CAD  Discharge Exam: Blood pressure (!) 156/73, pulse (!) 101, temperature 98.8 F (37.1 C), temperature source Oral, resp. rate 18, SpO2 99 %. Nursing noteand vitalsreviewed. Constitutional: He isoriented to person, place, and time. He appearswell-developedand well-nourished.No distress. Appears much younger than stated age HENT:  Head:Normocephalicand atraumatic.  Eyes:Conjunctivaeare normal.  Neck:No JVDpresent.  Cardiovascular:Normal rate,regular rhythmand normal heart sounds. No murmurheard. Pulses: Carotid pulses are on the right side with bruit, and on the left side withbruit. Radial pulses are 2+on the right side, and 2+on the left side.  Femoral pulses are 3+on the right side, and 2+on the left side. Dorsalis pedis pulses are 1+on the right side, and 1+on the left side.  Posterior tibial pulses are 0on the right side, and 1+on the left side.  Respiratory:Effort normaland breath sounds normal. He hasno wheezes. He hasno rales.  KV:QQVZ.Bowel sounds are normal. There isno tenderness. There isno rebound.  Musculoskeletal: He exhibits noedema.  Lymphadenopathy:  He has no cervical adenopathy.  Neurological: He isalertand oriented to persononly. Skin: Skin iswarmand dry. Warty mole X 2 on the back  Disposition: Discharge disposition: 01-Home or Self Care       Discharge  Instructions    Diet - low sodium heart healthy   Complete by:  As directed    Increase activity slowly   Complete by:  As directed      Allergies as of 12/06/2017      Reactions   Demerol Anaphylaxis      Medication List    STOP taking these medications   bisacodyl 5 MG EC tablet Commonly known as:  DULCOLAX     TAKE these medications   acetaminophen 500 MG tablet Commonly known as:  TYLENOL Take 1,000 mg by mouth daily as needed (back pain).   aspirin EC 81 MG tablet Take 81 mg by mouth daily.  brimonidine 0.2 % ophthalmic solution Commonly known as:  ALPHAGAN Place 1 drop into both eyes 2 (two) times daily.   clopidogrel 75 MG tablet Commonly known as:  PLAVIX Take 1 tablet (75 mg total) by mouth daily.   docusate sodium 100 MG capsule Commonly known as:  COLACE Take 1 capsule (100 mg total) by mouth 2 (two) times daily as needed for mild constipation.   donepezil 5 MG tablet Commonly known as:  ARICEPT Take 5 mg by mouth daily.   dorzolamide-timolol 22.3-6.8 MG/ML ophthalmic solution Commonly known as:  COSOPT Place 1 drop into both eyes 2 (two) times daily.   dutasteride 0.5 MG capsule Commonly known as:  AVODART Take 0.5 mg by mouth daily.   ferrous sulfate 325 (65 FE) MG tablet Take 1 tablet (325 mg total) by mouth 2 (two) times daily with a meal.   metoprolol succinate 25 MG 24 hr tablet Commonly known as:  TOPROL-XL Take 1 tablet (25 mg total) by mouth daily.   nitroGLYCERIN 0.4 MG SL tablet Commonly known as:  NITROSTAT Place 1 tablet (0.4 mg total) under the tongue every 5 (five) minutes x 3 doses as needed for chest pain.   ranolazine 500 MG 12 hr tablet Commonly known as:  RANEXA Take 1 tablet (500 mg total) by mouth 2 (two) times daily.   rosuvastatin 10 MG tablet Commonly known as:  CRESTOR Take 1 tablet (10 mg total) by mouth daily at 6 PM.   traMADol 50 MG tablet Commonly known as:  ULTRAM Take 50 mg by mouth daily.    TRAVATAN Z 0.004 % Soln ophthalmic solution Generic drug:  Travoprost (BAK Free) Place 1 drop into the left eye See admin instructions. On odd days      Follow-up Information    Nigel Mormon, MD Follow up on 12/13/2017.   Specialty:  Cardiology Why:  2:45 PM Contact information: [place] [address] [phone]           Signed: Nigel Mormon 12/06/2017, 9:18 AM  Nigel Mormon, MD [facility] Pager: [phone] Office: [phone] If no answer Cell [phone]

## 2017-12-06 NOTE — Evaluation (Signed)
Physical Therapy One time Evaluation Patient Details Name: Justin [name] MRN: [id number] DOB: 12/09/1923 Today's Date: 12/06/2017   History of Present Illness  84 y/o male with severe multivessel CAD, NSTEMI in 11/2017, PAD (b/l mod carotid stenosis), s/p endovascular repair of 5.5 cm infrarenal abdominal aorta aneurysm 06/03/2016, hypertension, h/o postop Afib in 2013 with no reported recurrence, admitted with chest pain.Patient had NSTEMI with peak trop 2.3, which was medically managed. Patient is currently chest pain free.  Clinical Impression  Pt admitted with above diagnosis. Pt currently without significant functional limitations and can d/c home using RW at supervision/Modif I and wife and pt aware that he needs to use RW at all times for safety.  Pt and wife informed that this PT recommends transition to Outpt PT for balance training after a few weeks of HHPT as pt did score 17/24 on DGI and is at risk for falls.  Pt and wife understand and agree.  Will sign off as pt being discharged home today.      Follow Up Recommendations Home health PT;Supervision/Assistance - 24 hour    Equipment Recommendations  None recommended by PT    Recommendations for Other Services       Precautions / Restrictions Precautions Precautions: Fall Restrictions Weight Bearing Restrictions: No      Mobility  Bed Mobility Overal bed mobility: Modified Independent             General bed mobility comments: no assist  Transfers Overall transfer level: Needs assistance Equipment used: Rolling walker (2 wheeled);None Transfers: Sit to/from Stand Sit to Stand: Supervision         General transfer comment: for safety  Ambulation/Gait Ambulation/Gait assistance: Min guard;Min assist Gait Distance (Feet): 400 Feet Assistive device: Rolling walker (2 wheeled);None Gait Pattern/deviations: Step-through pattern;Decreased stride length;Trunk flexed   Gait velocity interpretation: 1.31 -  2.62 ft/sec, indicative of limited community ambulator General Gait Details: Pt ambulates quickly and balance is impaired especially with challenges.  Pt encouraged to use RW at all times at home until he returns to PLOF.  Pt agrees to use the RW at all times until he can work with therapist. Also encouraged pt to slow down. Pt lacks awareness of his balance deficits.  Wife informed to watch pt closely at least initially.   Stairs            Wheelchair Mobility    Modified Rankin (Stroke Patients Only)       Balance Overall balance assessment: Needs assistance Sitting-balance support: No upper extremity supported;Feet supported Sitting balance-Leahy Scale: Good     Standing balance support: During functional activity;No upper extremity supported Standing balance-Leahy Scale: Fair Standing balance comment: can stand statically and maintain balance                 Standardized Balance Assessment Standardized Balance Assessment : Dynamic Gait Index   Dynamic Gait Index Level Surface: Mild Impairment Change in Gait Speed: Mild Impairment Gait with Horizontal Head Turns: Mild Impairment Gait with Vertical Head Turns: Mild Impairment Gait and Pivot Turn: Mild Impairment Step Over Obstacle: Mild Impairment Step Around Obstacles: Normal Steps: Mild Impairment Total Score: 17       Pertinent Vitals/Pain Pain Assessment: No/denies pain  HR 88-117 bpm, O2 98% onRA.    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     Hand Dominance   Dominant Hand: Right    Extremity/Trunk Assessment   Upper Extremity Assessment Upper Extremity Assessment: Defer to OT evaluation    Lower Extremity Assessment Lower Extremity Assessment: Generalized weakness    Cervical / Trunk Assessment Cervical / Trunk Assessment: Kyphotic  Communication   Communication: HOH(with aids)  Cognition Arousal/Alertness: Awake/alert Behavior During Therapy: WFL for tasks assessed/performed Overall Cognitive Status: History of cognitive impairments - at baseline                                 General Comments: pt with decreased awareness of deficits      General Comments General comments (skin integrity, edema, etc.): Pt scored 17/24 on DGI suggesting at risk for falls without device.  Pt aware to use RW at all times.     Exercises     Assessment/Plan    PT Assessment All further PT needs can be met in the next venue of care  PT Problem List Decreased mobility;Decreased balance;Decreased knowledge of use of DME;Decreased safety awareness       PT Treatment Interventions DME instruction;Gait training;Functional mobility training;Therapeutic activities;Therapeutic exercise;Balance training;Patient/family education    PT Goals (Current goals can be found in the Care Plan section)  Acute Rehab PT Goals Patient Stated Goal: to return to independent PT Goal Formulation: All assessment and education complete, DC therapy    Frequency     Barriers to discharge        Co-evaluation               AM-PAC PT "6 Clicks" Daily Activity  Outcome Measure Difficulty turning over in bed (including adjusting bedclothes, sheets and blankets)?: None Difficulty moving from lying on back to sitting on the side of the bed? : None Difficulty sitting down on and standing  up from a chair with arms (e.g., wheelchair, bedside commode, etc,.)?: None Help needed moving to and from a bed to chair (including a wheelchair)?: None Help needed walking in hospital room?: A Little Help needed climbing 3-5 steps with a railing? : A Little 6 Click Score: 22    End of Session Equipment Utilized During Treatment: Gait belt Activity Tolerance: Patient tolerated treatment well Patient left: in bed;with call bell/phone within reach;with family/visitor present Nurse Communication: Mobility status PT Visit Diagnosis: Muscle weakness (generalized) (M62.81);Other abnormalities of gait and mobility (R26.89)    Time: 9675-9163 PT Time Calculation (min) (ACUTE ONLY): 24 min   Charges:   PT Evaluation $PT Eval Low Complexity: 1 Low PT Treatments $Gait Training: 8-22 mins        [place] Pager:  [phone]  Office:  [place] 12/06/2017, 10:05 AM

## 2017-12-08 DIAGNOSIS — I2510 Atherosclerotic heart disease of native coronary artery without angina pectoris: Secondary | ICD-10-CM | POA: Diagnosis not present

## 2017-12-08 DIAGNOSIS — I6523 Occlusion and stenosis of bilateral carotid arteries: Secondary | ICD-10-CM | POA: Diagnosis not present

## 2017-12-08 DIAGNOSIS — F0390 Unspecified dementia without behavioral disturbance: Secondary | ICD-10-CM | POA: Diagnosis not present

## 2017-12-08 DIAGNOSIS — I214 Non-ST elevation (NSTEMI) myocardial infarction: Secondary | ICD-10-CM | POA: Diagnosis not present

## 2017-12-08 DIAGNOSIS — M47816 Spondylosis without myelopathy or radiculopathy, lumbar region: Secondary | ICD-10-CM | POA: Diagnosis not present

## 2017-12-08 DIAGNOSIS — I2584 Coronary atherosclerosis due to calcified coronary lesion: Secondary | ICD-10-CM | POA: Diagnosis not present

## 2017-12-09 DIAGNOSIS — E782 Mixed hyperlipidemia: Secondary | ICD-10-CM | POA: Diagnosis not present

## 2017-12-09 DIAGNOSIS — I25118 Atherosclerotic heart disease of native coronary artery with other forms of angina pectoris: Secondary | ICD-10-CM | POA: Diagnosis not present

## 2017-12-09 DIAGNOSIS — I10 Essential (primary) hypertension: Secondary | ICD-10-CM | POA: Diagnosis not present

## 2017-12-09 DIAGNOSIS — I252 Old myocardial infarction: Secondary | ICD-10-CM | POA: Diagnosis not present

## 2017-12-10 DIAGNOSIS — I2510 Atherosclerotic heart disease of native coronary artery without angina pectoris: Secondary | ICD-10-CM | POA: Diagnosis not present

## 2017-12-10 DIAGNOSIS — F0390 Unspecified dementia without behavioral disturbance: Secondary | ICD-10-CM | POA: Diagnosis not present

## 2017-12-10 DIAGNOSIS — M47816 Spondylosis without myelopathy or radiculopathy, lumbar region: Secondary | ICD-10-CM | POA: Diagnosis not present

## 2017-12-10 DIAGNOSIS — I2584 Coronary atherosclerosis due to calcified coronary lesion: Secondary | ICD-10-CM | POA: Diagnosis not present

## 2017-12-10 DIAGNOSIS — I214 Non-ST elevation (NSTEMI) myocardial infarction: Secondary | ICD-10-CM | POA: Diagnosis not present

## 2017-12-10 DIAGNOSIS — I6523 Occlusion and stenosis of bilateral carotid arteries: Secondary | ICD-10-CM | POA: Diagnosis not present

## 2017-12-13 DIAGNOSIS — I2510 Atherosclerotic heart disease of native coronary artery without angina pectoris: Secondary | ICD-10-CM | POA: Diagnosis not present

## 2017-12-13 DIAGNOSIS — I2584 Coronary atherosclerosis due to calcified coronary lesion: Secondary | ICD-10-CM | POA: Diagnosis not present

## 2017-12-13 DIAGNOSIS — M47816 Spondylosis without myelopathy or radiculopathy, lumbar region: Secondary | ICD-10-CM | POA: Diagnosis not present

## 2017-12-13 DIAGNOSIS — F0390 Unspecified dementia without behavioral disturbance: Secondary | ICD-10-CM | POA: Diagnosis not present

## 2017-12-13 DIAGNOSIS — I214 Non-ST elevation (NSTEMI) myocardial infarction: Secondary | ICD-10-CM | POA: Diagnosis not present

## 2017-12-13 DIAGNOSIS — I6523 Occlusion and stenosis of bilateral carotid arteries: Secondary | ICD-10-CM | POA: Diagnosis not present

## 2017-12-14 DIAGNOSIS — I6523 Occlusion and stenosis of bilateral carotid arteries: Secondary | ICD-10-CM | POA: Diagnosis not present

## 2017-12-14 DIAGNOSIS — I214 Non-ST elevation (NSTEMI) myocardial infarction: Secondary | ICD-10-CM | POA: Diagnosis not present

## 2017-12-14 DIAGNOSIS — I2584 Coronary atherosclerosis due to calcified coronary lesion: Secondary | ICD-10-CM | POA: Diagnosis not present

## 2017-12-14 DIAGNOSIS — I2510 Atherosclerotic heart disease of native coronary artery without angina pectoris: Secondary | ICD-10-CM | POA: Diagnosis not present

## 2017-12-14 DIAGNOSIS — M47816 Spondylosis without myelopathy or radiculopathy, lumbar region: Secondary | ICD-10-CM | POA: Diagnosis not present

## 2017-12-14 DIAGNOSIS — F0390 Unspecified dementia without behavioral disturbance: Secondary | ICD-10-CM | POA: Diagnosis not present

## 2017-12-15 DIAGNOSIS — I2510 Atherosclerotic heart disease of native coronary artery without angina pectoris: Secondary | ICD-10-CM | POA: Diagnosis not present

## 2017-12-15 DIAGNOSIS — I214 Non-ST elevation (NSTEMI) myocardial infarction: Secondary | ICD-10-CM | POA: Diagnosis not present

## 2017-12-15 DIAGNOSIS — M47816 Spondylosis without myelopathy or radiculopathy, lumbar region: Secondary | ICD-10-CM | POA: Diagnosis not present

## 2017-12-15 DIAGNOSIS — I6523 Occlusion and stenosis of bilateral carotid arteries: Secondary | ICD-10-CM | POA: Diagnosis not present

## 2017-12-15 DIAGNOSIS — F0390 Unspecified dementia without behavioral disturbance: Secondary | ICD-10-CM | POA: Diagnosis not present

## 2017-12-15 DIAGNOSIS — I2584 Coronary atherosclerosis due to calcified coronary lesion: Secondary | ICD-10-CM | POA: Diagnosis not present

## 2017-12-17 DIAGNOSIS — I6523 Occlusion and stenosis of bilateral carotid arteries: Secondary | ICD-10-CM | POA: Diagnosis not present

## 2017-12-17 DIAGNOSIS — M47816 Spondylosis without myelopathy or radiculopathy, lumbar region: Secondary | ICD-10-CM | POA: Diagnosis not present

## 2017-12-17 DIAGNOSIS — E041 Nontoxic single thyroid nodule: Secondary | ICD-10-CM | POA: Diagnosis not present

## 2017-12-17 DIAGNOSIS — E7849 Other hyperlipidemia: Secondary | ICD-10-CM | POA: Diagnosis not present

## 2017-12-17 DIAGNOSIS — I2510 Atherosclerotic heart disease of native coronary artery without angina pectoris: Secondary | ICD-10-CM | POA: Diagnosis not present

## 2017-12-17 DIAGNOSIS — I2584 Coronary atherosclerosis due to calcified coronary lesion: Secondary | ICD-10-CM | POA: Diagnosis not present

## 2017-12-17 DIAGNOSIS — R7302 Impaired glucose tolerance (oral): Secondary | ICD-10-CM | POA: Diagnosis not present

## 2017-12-17 DIAGNOSIS — I214 Non-ST elevation (NSTEMI) myocardial infarction: Secondary | ICD-10-CM | POA: Diagnosis not present

## 2017-12-17 DIAGNOSIS — F0390 Unspecified dementia without behavioral disturbance: Secondary | ICD-10-CM | POA: Diagnosis not present

## 2017-12-17 DIAGNOSIS — Z125 Encounter for screening for malignant neoplasm of prostate: Secondary | ICD-10-CM | POA: Diagnosis not present

## 2017-12-17 DIAGNOSIS — I10 Essential (primary) hypertension: Secondary | ICD-10-CM | POA: Diagnosis not present

## 2017-12-17 DIAGNOSIS — M109 Gout, unspecified: Secondary | ICD-10-CM | POA: Diagnosis not present

## 2017-12-21 DIAGNOSIS — F0390 Unspecified dementia without behavioral disturbance: Secondary | ICD-10-CM | POA: Diagnosis not present

## 2017-12-21 DIAGNOSIS — I6523 Occlusion and stenosis of bilateral carotid arteries: Secondary | ICD-10-CM | POA: Diagnosis not present

## 2017-12-21 DIAGNOSIS — I214 Non-ST elevation (NSTEMI) myocardial infarction: Secondary | ICD-10-CM | POA: Diagnosis not present

## 2017-12-21 DIAGNOSIS — M47816 Spondylosis without myelopathy or radiculopathy, lumbar region: Secondary | ICD-10-CM | POA: Diagnosis not present

## 2017-12-21 DIAGNOSIS — I2510 Atherosclerotic heart disease of native coronary artery without angina pectoris: Secondary | ICD-10-CM | POA: Diagnosis not present

## 2017-12-21 DIAGNOSIS — I2584 Coronary atherosclerosis due to calcified coronary lesion: Secondary | ICD-10-CM | POA: Diagnosis not present

## 2017-12-22 DIAGNOSIS — I2584 Coronary atherosclerosis due to calcified coronary lesion: Secondary | ICD-10-CM | POA: Diagnosis not present

## 2017-12-22 DIAGNOSIS — I6523 Occlusion and stenosis of bilateral carotid arteries: Secondary | ICD-10-CM | POA: Diagnosis not present

## 2017-12-22 DIAGNOSIS — F0390 Unspecified dementia without behavioral disturbance: Secondary | ICD-10-CM | POA: Diagnosis not present

## 2017-12-22 DIAGNOSIS — I214 Non-ST elevation (NSTEMI) myocardial infarction: Secondary | ICD-10-CM | POA: Diagnosis not present

## 2017-12-22 DIAGNOSIS — I2510 Atherosclerotic heart disease of native coronary artery without angina pectoris: Secondary | ICD-10-CM | POA: Diagnosis not present

## 2017-12-22 DIAGNOSIS — M47816 Spondylosis without myelopathy or radiculopathy, lumbar region: Secondary | ICD-10-CM | POA: Diagnosis not present

## 2017-12-23 DIAGNOSIS — I2510 Atherosclerotic heart disease of native coronary artery without angina pectoris: Secondary | ICD-10-CM | POA: Diagnosis not present

## 2017-12-23 DIAGNOSIS — I2584 Coronary atherosclerosis due to calcified coronary lesion: Secondary | ICD-10-CM | POA: Diagnosis not present

## 2017-12-23 DIAGNOSIS — I214 Non-ST elevation (NSTEMI) myocardial infarction: Secondary | ICD-10-CM | POA: Diagnosis not present

## 2017-12-23 DIAGNOSIS — I6523 Occlusion and stenosis of bilateral carotid arteries: Secondary | ICD-10-CM | POA: Diagnosis not present

## 2017-12-23 DIAGNOSIS — F0390 Unspecified dementia without behavioral disturbance: Secondary | ICD-10-CM | POA: Diagnosis not present

## 2017-12-23 DIAGNOSIS — M47816 Spondylosis without myelopathy or radiculopathy, lumbar region: Secondary | ICD-10-CM | POA: Diagnosis not present

## 2017-12-25 DIAGNOSIS — I214 Non-ST elevation (NSTEMI) myocardial infarction: Secondary | ICD-10-CM | POA: Diagnosis not present

## 2017-12-25 DIAGNOSIS — M47816 Spondylosis without myelopathy or radiculopathy, lumbar region: Secondary | ICD-10-CM | POA: Diagnosis not present

## 2017-12-25 DIAGNOSIS — I6523 Occlusion and stenosis of bilateral carotid arteries: Secondary | ICD-10-CM | POA: Diagnosis not present

## 2017-12-25 DIAGNOSIS — F0390 Unspecified dementia without behavioral disturbance: Secondary | ICD-10-CM | POA: Diagnosis not present

## 2017-12-25 DIAGNOSIS — I2510 Atherosclerotic heart disease of native coronary artery without angina pectoris: Secondary | ICD-10-CM | POA: Diagnosis not present

## 2017-12-25 DIAGNOSIS — I2584 Coronary atherosclerosis due to calcified coronary lesion: Secondary | ICD-10-CM | POA: Diagnosis not present

## 2017-12-27 DIAGNOSIS — Z0000 Encounter for general adult medical examination without abnormal findings: Secondary | ICD-10-CM | POA: Diagnosis not present

## 2017-12-27 DIAGNOSIS — I739 Peripheral vascular disease, unspecified: Secondary | ICD-10-CM | POA: Diagnosis not present

## 2017-12-27 DIAGNOSIS — M19049 Primary osteoarthritis, unspecified hand: Secondary | ICD-10-CM | POA: Diagnosis not present

## 2017-12-27 DIAGNOSIS — N401 Enlarged prostate with lower urinary tract symptoms: Secondary | ICD-10-CM | POA: Diagnosis not present

## 2017-12-27 DIAGNOSIS — Z6823 Body mass index (BMI) 23.0-23.9, adult: Secondary | ICD-10-CM | POA: Diagnosis not present

## 2017-12-27 DIAGNOSIS — R7302 Impaired glucose tolerance (oral): Secondary | ICD-10-CM | POA: Diagnosis not present

## 2017-12-27 DIAGNOSIS — R413 Other amnesia: Secondary | ICD-10-CM | POA: Diagnosis not present

## 2017-12-27 DIAGNOSIS — I129 Hypertensive chronic kidney disease with stage 1 through stage 4 chronic kidney disease, or unspecified chronic kidney disease: Secondary | ICD-10-CM | POA: Diagnosis not present

## 2017-12-27 DIAGNOSIS — E7849 Other hyperlipidemia: Secondary | ICD-10-CM | POA: Diagnosis not present

## 2017-12-27 DIAGNOSIS — I10 Essential (primary) hypertension: Secondary | ICD-10-CM | POA: Diagnosis not present

## 2017-12-27 DIAGNOSIS — I714 Abdominal aortic aneurysm, without rupture: Secondary | ICD-10-CM | POA: Diagnosis not present

## 2017-12-27 DIAGNOSIS — N183 Chronic kidney disease, stage 3 (moderate): Secondary | ICD-10-CM | POA: Diagnosis not present

## 2017-12-28 DIAGNOSIS — F0390 Unspecified dementia without behavioral disturbance: Secondary | ICD-10-CM | POA: Diagnosis not present

## 2017-12-28 DIAGNOSIS — M47816 Spondylosis without myelopathy or radiculopathy, lumbar region: Secondary | ICD-10-CM | POA: Diagnosis not present

## 2017-12-28 DIAGNOSIS — I2584 Coronary atherosclerosis due to calcified coronary lesion: Secondary | ICD-10-CM | POA: Diagnosis not present

## 2017-12-28 DIAGNOSIS — I2510 Atherosclerotic heart disease of native coronary artery without angina pectoris: Secondary | ICD-10-CM | POA: Diagnosis not present

## 2017-12-28 DIAGNOSIS — I214 Non-ST elevation (NSTEMI) myocardial infarction: Secondary | ICD-10-CM | POA: Diagnosis not present

## 2017-12-28 DIAGNOSIS — I6523 Occlusion and stenosis of bilateral carotid arteries: Secondary | ICD-10-CM | POA: Diagnosis not present

## 2017-12-30 DIAGNOSIS — I214 Non-ST elevation (NSTEMI) myocardial infarction: Secondary | ICD-10-CM | POA: Diagnosis not present

## 2017-12-30 DIAGNOSIS — I2510 Atherosclerotic heart disease of native coronary artery without angina pectoris: Secondary | ICD-10-CM | POA: Diagnosis not present

## 2017-12-30 DIAGNOSIS — I6523 Occlusion and stenosis of bilateral carotid arteries: Secondary | ICD-10-CM | POA: Diagnosis not present

## 2017-12-30 DIAGNOSIS — F0390 Unspecified dementia without behavioral disturbance: Secondary | ICD-10-CM | POA: Diagnosis not present

## 2017-12-30 DIAGNOSIS — I2584 Coronary atherosclerosis due to calcified coronary lesion: Secondary | ICD-10-CM | POA: Diagnosis not present

## 2017-12-30 DIAGNOSIS — M47816 Spondylosis without myelopathy or radiculopathy, lumbar region: Secondary | ICD-10-CM | POA: Diagnosis not present

## 2017-12-31 DIAGNOSIS — I214 Non-ST elevation (NSTEMI) myocardial infarction: Secondary | ICD-10-CM | POA: Diagnosis not present

## 2017-12-31 DIAGNOSIS — I2510 Atherosclerotic heart disease of native coronary artery without angina pectoris: Secondary | ICD-10-CM | POA: Diagnosis not present

## 2017-12-31 DIAGNOSIS — M47816 Spondylosis without myelopathy or radiculopathy, lumbar region: Secondary | ICD-10-CM | POA: Diagnosis not present

## 2017-12-31 DIAGNOSIS — I2584 Coronary atherosclerosis due to calcified coronary lesion: Secondary | ICD-10-CM | POA: Diagnosis not present

## 2017-12-31 DIAGNOSIS — F0390 Unspecified dementia without behavioral disturbance: Secondary | ICD-10-CM | POA: Diagnosis not present

## 2017-12-31 DIAGNOSIS — I6523 Occlusion and stenosis of bilateral carotid arteries: Secondary | ICD-10-CM | POA: Diagnosis not present

## 2018-01-03 DIAGNOSIS — I25118 Atherosclerotic heart disease of native coronary artery with other forms of angina pectoris: Secondary | ICD-10-CM | POA: Diagnosis not present

## 2018-01-03 DIAGNOSIS — I252 Old myocardial infarction: Secondary | ICD-10-CM | POA: Diagnosis not present

## 2018-01-03 DIAGNOSIS — M79641 Pain in right hand: Secondary | ICD-10-CM | POA: Diagnosis not present

## 2018-01-03 DIAGNOSIS — I10 Essential (primary) hypertension: Secondary | ICD-10-CM | POA: Diagnosis not present

## 2018-01-04 DIAGNOSIS — I2510 Atherosclerotic heart disease of native coronary artery without angina pectoris: Secondary | ICD-10-CM | POA: Diagnosis not present

## 2018-01-04 DIAGNOSIS — I2584 Coronary atherosclerosis due to calcified coronary lesion: Secondary | ICD-10-CM | POA: Diagnosis not present

## 2018-01-04 DIAGNOSIS — I214 Non-ST elevation (NSTEMI) myocardial infarction: Secondary | ICD-10-CM | POA: Diagnosis not present

## 2018-01-04 DIAGNOSIS — F0390 Unspecified dementia without behavioral disturbance: Secondary | ICD-10-CM | POA: Diagnosis not present

## 2018-01-04 DIAGNOSIS — M47816 Spondylosis without myelopathy or radiculopathy, lumbar region: Secondary | ICD-10-CM | POA: Diagnosis not present

## 2018-01-04 DIAGNOSIS — I6523 Occlusion and stenosis of bilateral carotid arteries: Secondary | ICD-10-CM | POA: Diagnosis not present

## 2018-01-10 ENCOUNTER — Ambulatory Visit (HOSPITAL_COMMUNITY)
Admission: RE | Admit: 2018-01-10 | Discharge: 2018-01-10 | Disposition: A | Discharge status: Home / Self Care | Payer: Medicare Other | Source: Ambulatory Visit | Attending: Family | Admitting: Family

## 2018-01-10 ENCOUNTER — Ambulatory Visit (INDEPENDENT_AMBULATORY_CARE_PROVIDER_SITE_OTHER): Payer: Medicare Other | Admitting: Family

## 2018-01-10 ENCOUNTER — Encounter: Payer: Self-pay | Admitting: Family

## 2018-01-10 VITALS — BP 164/84 | HR 82 | Resp 16 | Ht 70.5 in | Wt 158.0 lb

## 2018-01-10 DIAGNOSIS — I6523 Occlusion and stenosis of bilateral carotid arteries: Secondary | ICD-10-CM | POA: Diagnosis not present

## 2018-01-10 DIAGNOSIS — Z95828 Presence of other vascular implants and grafts: Secondary | ICD-10-CM | POA: Diagnosis not present

## 2018-01-10 DIAGNOSIS — I714 Abdominal aortic aneurysm, without rupture, unspecified: Secondary | ICD-10-CM

## 2018-01-10 DIAGNOSIS — R059 Cough, unspecified: Secondary | ICD-10-CM

## 2018-01-10 DIAGNOSIS — Z87891 Personal history of nicotine dependence: Secondary | ICD-10-CM

## 2018-01-10 DIAGNOSIS — I200 Unstable angina: Secondary | ICD-10-CM | POA: Diagnosis not present

## 2018-01-10 DIAGNOSIS — R05 Cough: Secondary | ICD-10-CM | POA: Diagnosis not present

## 2018-01-10 DIAGNOSIS — R0989 Other specified symptoms and signs involving the circulatory and respiratory systems: Secondary | ICD-10-CM | POA: Diagnosis not present

## 2018-01-10 NOTE — Patient Instructions (Signed)
Stroke Prevention Some health problems and behaviors may make it more likely for you to have a stroke. Below are ways to lessen your risk of having a stroke.  Be active for at least 30 minutes on most or all days.  Do not smoke. Try not to be around others who smoke.  Do not drink too much alcohol. ? Do not have more than 2 drinks a day if you are a man. ? Do not have more than 1 drink a day if you are a woman and are not pregnant.  Eat healthy foods, such as fruits and vegetables. If you were put on a specific diet, follow the diet as told.  Keep your cholesterol levels under control through diet and medicines. Look for foods that are low in saturated fat, trans fat, cholesterol, and are high in fiber.  If you have diabetes, follow all diet plans and take your medicine as told.  Ask your doctor if you need treatment to lower your blood pressure. If you have high blood pressure (hypertension), follow all diet plans and take your medicine as told by your doctor.  If you are 59-33 years old, have your blood pressure checked every 3-5 years. If you are age 58 or older, have your blood pressure checked every year.  Keep a healthy weight. Eat foods that are low in calories, salt, saturated fat, trans fat, and cholesterol.  Do not take drugs.  Avoid birth control pills, if this applies. Talk to your doctor about the risks of taking birth control pills.  Talk to your doctor if you have sleep problems (sleep apnea).  Take all medicine as told by your doctor. ? You may be told to take aspirin or blood thinner medicine. Take this medicine as told by your doctor. ? Understand your medicine instructions.  Make sure any other conditions you have are being taken care of.  Get help right away if:  You suddenly lose feeling (you feel numb) or have weakness in your face, arm, or leg.  Your face or eyelid hangs down to one side.  You suddenly feel confused.  You have trouble talking  (aphasia) or understanding what people are saying.  You suddenly have trouble seeing in one or both eyes.  You suddenly have trouble walking.  You are dizzy.  You lose your balance or your movements are clumsy (uncoordinated).  You suddenly have a very bad headache and you do not know the cause.  You have new chest pain.  Your heart feels like it is fluttering or skipping a beat (irregular heartbeat). Do not wait to see if the symptoms above go away. Get help right away. Call your local emergency services (911 in [place] Do not drive yourself to the hospital. This information is not intended to replace advice given to you by your health care provider. Make sure you discuss any questions you have with your health care provider. Document Released: 07/28/2011 Document Revised: 07/04/2015 Document Reviewed: 07/29/2012 Elsevier Interactive Patient Education  2018 [employer].    Before your next abdominal ultrasound:  Avoid gas forming foods and beverages the day before the test.   Take two Extra-Strength Gas-X capsules at bedtime the night before the test. Take another two Extra-Strength Gas-X capsules in the middle of the night if you get up to the restroom, if not, first thing in the morning with water.  Do not chew gum.

## 2018-01-10 NOTE — Progress Notes (Signed)
Chief Complaint: Follow up Extracranial Carotid Artery Stenosis and s/p endovascular repair of AAA   History of Present Illness  Justin [name] is a 84 y.o. male who is status post endovascular repair of a 5.5 cm infrarenal abdominal aortic aneurysm on 06/03/2016. His postoperative course was uncomplicated. We are also monitoring his extracranial carotid artery stenosis.   Dr. Trula Slade last evaluated pt on 03-29-17 At that time aortic diameter was 5.4 cm. No evidence of endoleak Dr. Trula Slade advised follow-up ultrasound in 6 months. If this shows no significant change, we can follow this on an annual basis Carotid stenosis: The patient remained asymptomatic. Follow up carotid duplex in 6 months.  Pt has seen Dr. Vertell Limber for his back pain, has had 2 ESI's, states the pain was helped temporarily, is due for another in July 2019.  Pt denies abdominal pain. He does have GERD.   Dr. Trula Slade evaluated pt on 07-20-16. At that timehis pedal pulses were palpablebilaterally. No significant edema.At that time the patient complainedof some low back pain as well as some numbness in his legs which didnot bother him that much. His CT scan showedthat the stent graft was in good position without evidence of endoleak.  He had several lumbar spine surgeries. He denies symptoms referable to claudication in his legs with walking.  The patient denies history of stroke or TIA symptoms.  He had an MI in October 2019, his cardiologist is Dr. Virgina Jock with [facility]. Heart cath done, no intervention (Dr. Bonney Roussel note reviewed).   His wife states his blood pressure at home is 150's/80's.   He takes a daily 81 mg ASA, Plavix, and Crestor.  Pt states he has had a cough for the last 2 weeks that is worse when supine. He saw his PCP recently, but before he he had this cough.   Diabetic: No Tobaccos use:  former smoker, quit in 1980   Past Medical History:  Diagnosis Date  .  AAA (abdominal aortic aneurysm) ([place])    "we've known about it since ~ 2000"  . Arthritis    in back and hands:  Gout  . Atrial fibrillation ([place])    a. 04/2011 post op  . BPH (benign prostatic hyperplasia)   . Bronchitis    hx of; "once or twice in my life" (04/21/11)  . Cancer ([place])    Basal Cell carcinoma- per Dr Jacquiline Doe office notes  . Carotid artery disease ([place])   . Chronic kidney disease    stage 3 - per Dr Jacquiline Doe office notes  . Chronic lower back pain 1995  . Eczema   . GERD (gastroesophageal reflux disease)   . Gout of big toe    "h/o in both"  . Hiatal hernia   . History of kidney stones   . Hypertension   . Lumbar spondylosis   . Lumbar stenosis    a. s/p  anterolateral decompression and lateral plating 04/21/2011  . Migraine    "left ~ 1980's"  . Stomach ulcer    "years ago; treated w/diet"     Social History Social History   Tobacco Use  . Smoking status: Former Smoker    Packs/day: 1.00    Years: 20.00    Pack years: 20.00    Types: Cigarettes    Last attempt to quit: 02/10/1980    Years since quitting: 37.9  . Smokeless tobacco: Never Used  Substance Use Topics  . Alcohol use: No  . Drug use: No  Family History Family History  Problem Relation Age of Onset  . Alzheimer's disease Mother        died @ 27  . COPD Father        died @ 26  . Cancer Father   . Hypertension Father   . Heart disease Father   . Stroke Sister   . Cancer Brother        stomach  . Heart disease Brother        Aneurysm  . Hypertension Son   . Heart disease Son        Aneurysm  . Hypertension Son   . Heart disease Son        Aneurysm  . Anesthesia problems Neg Hx   . Hypotension Neg Hx   . Malignant hyperthermia Neg Hx   . Pseudochol deficiency Neg Hx     Surgical History Past Surgical History:  Procedure Laterality Date  . ABDOMINAL AORTA STENT  06/03/2016   ABDOMINAL AORTIC ENDOVASCULAR STENT GRAFT insertion (N/A)  . ABDOMINAL AORTIC ENDOVASCULAR  STENT GRAFT N/A 06/03/2016   Procedure: ABDOMINAL AORTIC ENDOVASCULAR STENT GRAFT insertion;  Surgeon: Serafina Mitchell, MD;  Location: [facility];  Service: Vascular;  Laterality: N/A;  . ANTERIOR LAT LUMBAR FUSION  04/21/11   w/PEEK cage, allograft, and lateral plate (r)  . ANTERIOR LAT LUMBAR FUSION  04/21/2011   Procedure: ANTERIOR LATERAL LUMBAR FUSION 1 LEVEL;  Surgeon: Erline Levine, MD;  Location: [facility];  Service: Neurosurgery;  Laterality: Right;  RIGHT Lumbar three-four anterolateral decompression with fusion and lateral plating  . APPENDECTOMY  1946  . BACK SURGERY  04/21/11   "today makes the 5th time"  . CATARACT EXTRACTION, BILATERAL    . EYE SURGERY Bilateral    Cataract  . LEFT HEART CATH AND CORONARY ANGIOGRAPHY N/A 11/22/2017   Procedure: LEFT HEART CATH AND CORONARY ANGIOGRAPHY;  Surgeon: Nigel Mormon, MD;  Location: [facility];  Service: Cardiovascular;  Laterality: N/A;  . PROSTATE SURGERY    . SPINE SURGERY     X's 5   . TONSILLECTOMY     "as a child" and Adneoids    Allergies  Allergen Reactions  . Demerol Anaphylaxis    Current Outpatient Medications  Medication Sig Dispense Refill  . acetaminophen (TYLENOL) 500 MG tablet Take 1,000 mg by mouth daily as needed (back pain).    Marland Kitchen aspirin EC 81 MG tablet Take 81 mg by mouth daily.     . brimonidine (ALPHAGAN) 0.2 % ophthalmic solution Place 1 drop into both eyes 2 (two) times daily.     . clopidogrel (PLAVIX) 75 MG tablet Take 1 tablet (75 mg total) by mouth daily. 30 tablet 3  . docusate sodium (COLACE) 100 MG capsule Take 1 capsule (100 mg total) by mouth 2 (two) times daily as needed for mild constipation. 60 capsule 2  . donepezil (ARICEPT) 5 MG tablet Take 5 mg by mouth daily.  4  . dorzolamide-timolol (COSOPT) 22.3-6.8 MG/ML ophthalmic solution Place 1 drop into both eyes 2 (two) times daily.     Marland Kitchen dutasteride (AVODART) 0.5 MG capsule Take 0.5 mg by mouth daily.     . ferrous sulfate 325 (65  FE) MG tablet Take 1 tablet (325 mg total) by mouth 2 (two) times daily with a meal. 60 tablet 2  . metoprolol succinate (TOPROL-XL) 25 MG 24 hr tablet Take 1 tablet (25 mg total) by mouth daily. 30 tablet 3  .  nitroGLYCERIN (NITROSTAT) 0.4 MG SL tablet Place 1 tablet (0.4 mg total) under the tongue every 5 (five) minutes x 3 doses as needed for chest pain. 30 tablet 3  . ranolazine (RANEXA) 500 MG 12 hr tablet Take 1 tablet (500 mg total) by mouth 2 (two) times daily. 60 tablet 2  . rosuvastatin (CRESTOR) 10 MG tablet Take 1 tablet (10 mg total) by mouth daily at 6 PM. 30 tablet 3  . traMADol (ULTRAM) 50 MG tablet Take 50 mg by mouth daily.  0  . TRAVATAN Z 0.004 % SOLN ophthalmic solution Place 1 drop into the left eye See admin instructions. On odd days     No current facility-administered medications for this visit.     Review of Systems : See HPI for pertinent positives and negatives.  Physical Examination  Vitals:   01/10/18 1131 01/10/18 1139  BP: (!) 157/89 (!) 164/84  Pulse: 82   Resp: 16   SpO2: 98%   Weight: 158 lb (71.7 kg)   Height: 5' 10.5" (1.791 m)    Body mass index is 22.35 kg/m.  General: A&O x 3, WD, male HEENT: No gross abnormalities  Pulmonary: Sym exp, respirations are non labored, scattered rales in left posterior fields (lower 2/3), no rhonchi or wheezing. Cardiac: Regular rhythm and rate, no murmur appreciated  Vascular: Vessel Right Left  Radial 2+Palpable 2+Palpable  Brachial Palpable Palpable  Carotid  without bruit  without bruit  Aorta Not palpable N/A  Femoral Palpable Palpable  Popliteal Not palpable 1+palpable  PT Faintly Palpable Faintly Palpable  DP Not Palpable Not Palpable   Gastrointestinal: soft, NTND, -G/R, - HSM,  reducible moderate sized asymptomatic ventral hernia when lying down or sitting up,, - CVAT B. Musculoskeletal: M/S 5/5 throughout, extremities without ischemic changes. Skin: No rashes, no ulcers, no cellulitis.    Neurologic: Pain and light touch intact in extremities, Motor exam as above.  CN 2-12 intact except is hard of hearing, mostly corrected with bilateral hearing aids. Psychiatric: Normal thought content, mood appropriate for clinical situation.     Assessment: Justin [name] is a 84 y.o. male who is s/p EVAR (Date: 06/03/2016). He also has bilateral extracranial carotid artery stenosis. He has no history of stroke or TIA. He has known lumbar spine issues, and sees Dr. Vertell Limber for this He has no new back pain.   Fortunatley he does not have DM and quit tobacco use in 1980.  He takes a daily 81 mg ASA, Plavix, and Crestor.  Pt states he has had a cough for the last 2 weeks that is worse when supine. He denies dyspnea.  He saw his PCP recently, but before he he had this cough. Auscultation of lungs: scattered rales in left posterior fields, lower 2/3. I advised pt to call his PCP for advice re this.  I advised taking 500 mg extra vitamin C daily, take a daily multivitamin, and eat hot/warm chicken soup.   DATA  Carotid Duplex (01-10-18): 60-79% bilateral ICA stenosis. Bilateral vertebral artery flow is antegrade.  Bilateral subclavian artery waveforms are normal.  No significant change compared to the exam on 07-20-17.    EVAR Duplex   Previous (Date: 03-29-17) AAA sac size: 5.4 cm; Right CIA: 1.4 cm; Left CIA: 1.7 cm  Current (Date: 07-20-17)  AAA sac size: 5.4 cm; Right CIA: 1.7 cm; Left CIA: 1.5 cm  no endoleak detected   CTA Abd/Pelvis (Date: 05-19-16) Aorta: Infrarenal abdominal aortic aneurysm measuring up to  5.5 cm. Large amount of mural thrombus within this aneurysm particularly along the right posterior aspect. No aortic dissection. 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 The abdominal aortic aneurysm terminates above the aortic bifurcation. Celiac: Celiac trunk is patent without aneurysm or dissection. Mild stenosis of the origin  related to calcified plaque. SMA: Patent without significant stenosis and no evidence for dissection. Small amount of calcified plaque at the origin. Renals: Single bilateral renal arteries with calcified plaque near the origins. No significant renal artery stenosis. Ectasia at the distal right renal artery measuring up to 0.7 cm. IMA: Patent without evidence of aneurysm, dissection, vasculitis or significant stenosis. Inflow: Peripheral calcifications involving the right iliac arteries without significant stenosis. Right common iliac artery is mildly tortuous. Calcified plaque in left common iliac artery with mild narrowing. Internal and external iliac arteries are patent bilaterally. Proximal Outflow: Atherosclerotic calcifications in the common femoral arteries bilaterally. No significant stenosis. Proximal femoral arteries are patent bilaterally. Veins: Main portal venous system is patent. IVC and renal veins are Patent.    Normal bilateral ABI's on 05-27-16 with bi and triphasic waveforms.      Plan: Follow-up in 6 months with Carotid Duplex and EVAR duplex.   I discussed in depth with the patient the nature of atherosclerosis, and emphasized the importance of maximal medical management including strict control of blood pressure, blood glucose, and lipid levels, obtaining regular exercise, and continued cessation of smoking.  The patient is aware that without maximal medical management the underlying atherosclerotic disease process will progress, limiting the benefit of any interventions. The patient was given information about stroke prevention and what symptoms should prompt the patient to seek immediate medical care. Thank you for allowing [place] to participate in this patient's care.  Clemon Chambers, RN, MSN, FNP-C [facility] Office: [facility] Physician: Trula Slade  01/10/18 11:58 AM

## 2018-01-11 DIAGNOSIS — M47816 Spondylosis without myelopathy or radiculopathy, lumbar region: Secondary | ICD-10-CM | POA: Diagnosis not present

## 2018-01-11 DIAGNOSIS — F0390 Unspecified dementia without behavioral disturbance: Secondary | ICD-10-CM | POA: Diagnosis not present

## 2018-01-11 DIAGNOSIS — I214 Non-ST elevation (NSTEMI) myocardial infarction: Secondary | ICD-10-CM | POA: Diagnosis not present

## 2018-01-11 DIAGNOSIS — I6523 Occlusion and stenosis of bilateral carotid arteries: Secondary | ICD-10-CM | POA: Diagnosis not present

## 2018-01-11 DIAGNOSIS — R05 Cough: Secondary | ICD-10-CM | POA: Diagnosis not present

## 2018-01-11 DIAGNOSIS — I2510 Atherosclerotic heart disease of native coronary artery without angina pectoris: Secondary | ICD-10-CM | POA: Diagnosis not present

## 2018-01-11 DIAGNOSIS — J181 Lobar pneumonia, unspecified organism: Secondary | ICD-10-CM | POA: Diagnosis not present

## 2018-01-11 DIAGNOSIS — Z6823 Body mass index (BMI) 23.0-23.9, adult: Secondary | ICD-10-CM | POA: Diagnosis not present

## 2018-01-11 DIAGNOSIS — I2584 Coronary atherosclerosis due to calcified coronary lesion: Secondary | ICD-10-CM | POA: Diagnosis not present

## 2018-01-13 DIAGNOSIS — I6523 Occlusion and stenosis of bilateral carotid arteries: Secondary | ICD-10-CM | POA: Diagnosis not present

## 2018-01-13 DIAGNOSIS — F0390 Unspecified dementia without behavioral disturbance: Secondary | ICD-10-CM | POA: Diagnosis not present

## 2018-01-13 DIAGNOSIS — I2510 Atherosclerotic heart disease of native coronary artery without angina pectoris: Secondary | ICD-10-CM | POA: Diagnosis not present

## 2018-01-13 DIAGNOSIS — M47816 Spondylosis without myelopathy or radiculopathy, lumbar region: Secondary | ICD-10-CM | POA: Diagnosis not present

## 2018-01-13 DIAGNOSIS — I2584 Coronary atherosclerosis due to calcified coronary lesion: Secondary | ICD-10-CM | POA: Diagnosis not present

## 2018-01-13 DIAGNOSIS — I214 Non-ST elevation (NSTEMI) myocardial infarction: Secondary | ICD-10-CM | POA: Diagnosis not present

## 2018-01-14 DIAGNOSIS — I214 Non-ST elevation (NSTEMI) myocardial infarction: Secondary | ICD-10-CM | POA: Diagnosis not present

## 2018-01-14 DIAGNOSIS — F0390 Unspecified dementia without behavioral disturbance: Secondary | ICD-10-CM | POA: Diagnosis not present

## 2018-01-14 DIAGNOSIS — M47816 Spondylosis without myelopathy or radiculopathy, lumbar region: Secondary | ICD-10-CM | POA: Diagnosis not present

## 2018-01-14 DIAGNOSIS — I2584 Coronary atherosclerosis due to calcified coronary lesion: Secondary | ICD-10-CM | POA: Diagnosis not present

## 2018-01-14 DIAGNOSIS — I6523 Occlusion and stenosis of bilateral carotid arteries: Secondary | ICD-10-CM | POA: Diagnosis not present

## 2018-01-14 DIAGNOSIS — I2510 Atherosclerotic heart disease of native coronary artery without angina pectoris: Secondary | ICD-10-CM | POA: Diagnosis not present

## 2018-01-18 DIAGNOSIS — I10 Essential (primary) hypertension: Secondary | ICD-10-CM | POA: Diagnosis not present

## 2018-01-18 DIAGNOSIS — J181 Lobar pneumonia, unspecified organism: Secondary | ICD-10-CM | POA: Diagnosis not present

## 2018-01-18 DIAGNOSIS — R600 Localized edema: Secondary | ICD-10-CM | POA: Diagnosis not present

## 2018-01-18 DIAGNOSIS — Z6823 Body mass index (BMI) 23.0-23.9, adult: Secondary | ICD-10-CM | POA: Diagnosis not present

## 2018-01-19 DIAGNOSIS — I252 Old myocardial infarction: Secondary | ICD-10-CM | POA: Diagnosis not present

## 2018-01-19 DIAGNOSIS — I25118 Atherosclerotic heart disease of native coronary artery with other forms of angina pectoris: Secondary | ICD-10-CM | POA: Diagnosis not present

## 2018-01-19 DIAGNOSIS — M7989 Other specified soft tissue disorders: Secondary | ICD-10-CM | POA: Diagnosis not present

## 2018-01-19 DIAGNOSIS — I10 Essential (primary) hypertension: Secondary | ICD-10-CM | POA: Diagnosis not present

## 2018-01-21 DIAGNOSIS — I2510 Atherosclerotic heart disease of native coronary artery without angina pectoris: Secondary | ICD-10-CM | POA: Diagnosis not present

## 2018-01-21 DIAGNOSIS — F0390 Unspecified dementia without behavioral disturbance: Secondary | ICD-10-CM | POA: Diagnosis not present

## 2018-01-21 DIAGNOSIS — I6523 Occlusion and stenosis of bilateral carotid arteries: Secondary | ICD-10-CM | POA: Diagnosis not present

## 2018-01-21 DIAGNOSIS — I2584 Coronary atherosclerosis due to calcified coronary lesion: Secondary | ICD-10-CM | POA: Diagnosis not present

## 2018-01-21 DIAGNOSIS — M47816 Spondylosis without myelopathy or radiculopathy, lumbar region: Secondary | ICD-10-CM | POA: Diagnosis not present

## 2018-01-21 DIAGNOSIS — I214 Non-ST elevation (NSTEMI) myocardial infarction: Secondary | ICD-10-CM | POA: Diagnosis not present

## 2018-01-24 DIAGNOSIS — I2584 Coronary atherosclerosis due to calcified coronary lesion: Secondary | ICD-10-CM | POA: Diagnosis not present

## 2018-01-24 DIAGNOSIS — Z79891 Long term (current) use of opiate analgesic: Secondary | ICD-10-CM | POA: Diagnosis not present

## 2018-01-24 DIAGNOSIS — I129 Hypertensive chronic kidney disease with stage 1 through stage 4 chronic kidney disease, or unspecified chronic kidney disease: Secondary | ICD-10-CM | POA: Diagnosis not present

## 2018-01-24 DIAGNOSIS — Z85828 Personal history of other malignant neoplasm of skin: Secondary | ICD-10-CM | POA: Diagnosis not present

## 2018-01-24 DIAGNOSIS — I6523 Occlusion and stenosis of bilateral carotid arteries: Secondary | ICD-10-CM | POA: Diagnosis not present

## 2018-01-24 DIAGNOSIS — Z7982 Long term (current) use of aspirin: Secondary | ICD-10-CM | POA: Diagnosis not present

## 2018-01-24 DIAGNOSIS — Z95828 Presence of other vascular implants and grafts: Secondary | ICD-10-CM | POA: Diagnosis not present

## 2018-01-24 DIAGNOSIS — F0390 Unspecified dementia without behavioral disturbance: Secondary | ICD-10-CM | POA: Diagnosis not present

## 2018-01-24 DIAGNOSIS — Z87891 Personal history of nicotine dependence: Secondary | ICD-10-CM | POA: Diagnosis not present

## 2018-01-24 DIAGNOSIS — I25118 Atherosclerotic heart disease of native coronary artery with other forms of angina pectoris: Secondary | ICD-10-CM | POA: Diagnosis not present

## 2018-01-24 DIAGNOSIS — M47816 Spondylosis without myelopathy or radiculopathy, lumbar region: Secondary | ICD-10-CM | POA: Diagnosis not present

## 2018-01-24 DIAGNOSIS — I4891 Unspecified atrial fibrillation: Secondary | ICD-10-CM | POA: Diagnosis not present

## 2018-01-24 DIAGNOSIS — N183 Chronic kidney disease, stage 3 (moderate): Secondary | ICD-10-CM | POA: Diagnosis not present

## 2018-01-25 DIAGNOSIS — I25118 Atherosclerotic heart disease of native coronary artery with other forms of angina pectoris: Secondary | ICD-10-CM | POA: Diagnosis not present

## 2018-01-25 DIAGNOSIS — F0390 Unspecified dementia without behavioral disturbance: Secondary | ICD-10-CM | POA: Diagnosis not present

## 2018-01-25 DIAGNOSIS — I2584 Coronary atherosclerosis due to calcified coronary lesion: Secondary | ICD-10-CM | POA: Diagnosis not present

## 2018-01-25 DIAGNOSIS — M47816 Spondylosis without myelopathy or radiculopathy, lumbar region: Secondary | ICD-10-CM | POA: Diagnosis not present

## 2018-01-25 DIAGNOSIS — J181 Lobar pneumonia, unspecified organism: Secondary | ICD-10-CM | POA: Diagnosis not present

## 2018-01-25 DIAGNOSIS — I129 Hypertensive chronic kidney disease with stage 1 through stage 4 chronic kidney disease, or unspecified chronic kidney disease: Secondary | ICD-10-CM | POA: Diagnosis not present

## 2018-01-25 DIAGNOSIS — I10 Essential (primary) hypertension: Secondary | ICD-10-CM | POA: Diagnosis not present

## 2018-01-25 DIAGNOSIS — Z6824 Body mass index (BMI) 24.0-24.9, adult: Secondary | ICD-10-CM | POA: Diagnosis not present

## 2018-01-25 DIAGNOSIS — I6523 Occlusion and stenosis of bilateral carotid arteries: Secondary | ICD-10-CM | POA: Diagnosis not present

## 2018-01-26 DIAGNOSIS — I10 Essential (primary) hypertension: Secondary | ICD-10-CM | POA: Diagnosis not present

## 2018-01-31 DIAGNOSIS — I129 Hypertensive chronic kidney disease with stage 1 through stage 4 chronic kidney disease, or unspecified chronic kidney disease: Secondary | ICD-10-CM | POA: Diagnosis not present

## 2018-01-31 DIAGNOSIS — I25118 Atherosclerotic heart disease of native coronary artery with other forms of angina pectoris: Secondary | ICD-10-CM | POA: Diagnosis not present

## 2018-01-31 DIAGNOSIS — I6523 Occlusion and stenosis of bilateral carotid arteries: Secondary | ICD-10-CM | POA: Diagnosis not present

## 2018-01-31 DIAGNOSIS — F0390 Unspecified dementia without behavioral disturbance: Secondary | ICD-10-CM | POA: Diagnosis not present

## 2018-01-31 DIAGNOSIS — I2584 Coronary atherosclerosis due to calcified coronary lesion: Secondary | ICD-10-CM | POA: Diagnosis not present

## 2018-01-31 DIAGNOSIS — M47816 Spondylosis without myelopathy or radiculopathy, lumbar region: Secondary | ICD-10-CM | POA: Diagnosis not present

## 2018-02-07 DIAGNOSIS — E782 Mixed hyperlipidemia: Secondary | ICD-10-CM | POA: Diagnosis not present

## 2018-02-07 DIAGNOSIS — I25118 Atherosclerotic heart disease of native coronary artery with other forms of angina pectoris: Secondary | ICD-10-CM | POA: Diagnosis not present

## 2018-02-07 DIAGNOSIS — I252 Old myocardial infarction: Secondary | ICD-10-CM | POA: Diagnosis not present

## 2018-02-07 DIAGNOSIS — I129 Hypertensive chronic kidney disease with stage 1 through stage 4 chronic kidney disease, or unspecified chronic kidney disease: Secondary | ICD-10-CM | POA: Diagnosis not present

## 2018-02-11 DIAGNOSIS — F0390 Unspecified dementia without behavioral disturbance: Secondary | ICD-10-CM | POA: Diagnosis not present

## 2018-02-11 DIAGNOSIS — M47816 Spondylosis without myelopathy or radiculopathy, lumbar region: Secondary | ICD-10-CM | POA: Diagnosis not present

## 2018-02-11 DIAGNOSIS — I6523 Occlusion and stenosis of bilateral carotid arteries: Secondary | ICD-10-CM | POA: Diagnosis not present

## 2018-02-11 DIAGNOSIS — I25118 Atherosclerotic heart disease of native coronary artery with other forms of angina pectoris: Secondary | ICD-10-CM | POA: Diagnosis not present

## 2018-02-11 DIAGNOSIS — I129 Hypertensive chronic kidney disease with stage 1 through stage 4 chronic kidney disease, or unspecified chronic kidney disease: Secondary | ICD-10-CM | POA: Diagnosis not present

## 2018-02-11 DIAGNOSIS — I2584 Coronary atherosclerosis due to calcified coronary lesion: Secondary | ICD-10-CM | POA: Diagnosis not present

## 2018-02-14 DIAGNOSIS — M1A09X0 Idiopathic chronic gout, multiple sites, without tophus (tophi): Secondary | ICD-10-CM | POA: Diagnosis not present

## 2018-02-14 DIAGNOSIS — I10 Essential (primary) hypertension: Secondary | ICD-10-CM | POA: Diagnosis not present

## 2018-02-14 DIAGNOSIS — Z6824 Body mass index (BMI) 24.0-24.9, adult: Secondary | ICD-10-CM | POA: Diagnosis not present

## 2018-02-14 DIAGNOSIS — Z6823 Body mass index (BMI) 23.0-23.9, adult: Secondary | ICD-10-CM | POA: Diagnosis not present

## 2018-02-14 DIAGNOSIS — J181 Lobar pneumonia, unspecified organism: Secondary | ICD-10-CM | POA: Diagnosis not present

## 2018-02-15 DIAGNOSIS — I6523 Occlusion and stenosis of bilateral carotid arteries: Secondary | ICD-10-CM | POA: Diagnosis not present

## 2018-02-15 DIAGNOSIS — I2584 Coronary atherosclerosis due to calcified coronary lesion: Secondary | ICD-10-CM | POA: Diagnosis not present

## 2018-02-15 DIAGNOSIS — M47816 Spondylosis without myelopathy or radiculopathy, lumbar region: Secondary | ICD-10-CM | POA: Diagnosis not present

## 2018-02-15 DIAGNOSIS — I129 Hypertensive chronic kidney disease with stage 1 through stage 4 chronic kidney disease, or unspecified chronic kidney disease: Secondary | ICD-10-CM | POA: Diagnosis not present

## 2018-02-15 DIAGNOSIS — F0390 Unspecified dementia without behavioral disturbance: Secondary | ICD-10-CM | POA: Diagnosis not present

## 2018-02-15 DIAGNOSIS — I25118 Atherosclerotic heart disease of native coronary artery with other forms of angina pectoris: Secondary | ICD-10-CM | POA: Diagnosis not present

## 2018-02-17 DIAGNOSIS — M47816 Spondylosis without myelopathy or radiculopathy, lumbar region: Secondary | ICD-10-CM | POA: Diagnosis not present

## 2018-02-17 DIAGNOSIS — F0390 Unspecified dementia without behavioral disturbance: Secondary | ICD-10-CM | POA: Diagnosis not present

## 2018-02-17 DIAGNOSIS — I129 Hypertensive chronic kidney disease with stage 1 through stage 4 chronic kidney disease, or unspecified chronic kidney disease: Secondary | ICD-10-CM | POA: Diagnosis not present

## 2018-02-17 DIAGNOSIS — I6523 Occlusion and stenosis of bilateral carotid arteries: Secondary | ICD-10-CM | POA: Diagnosis not present

## 2018-02-17 DIAGNOSIS — I2584 Coronary atherosclerosis due to calcified coronary lesion: Secondary | ICD-10-CM | POA: Diagnosis not present

## 2018-02-17 DIAGNOSIS — I25118 Atherosclerotic heart disease of native coronary artery with other forms of angina pectoris: Secondary | ICD-10-CM | POA: Diagnosis not present

## 2018-02-22 DIAGNOSIS — J181 Lobar pneumonia, unspecified organism: Secondary | ICD-10-CM | POA: Diagnosis not present

## 2018-02-22 DIAGNOSIS — I10 Essential (primary) hypertension: Secondary | ICD-10-CM | POA: Diagnosis not present

## 2018-02-22 DIAGNOSIS — H401131 Primary open-angle glaucoma, bilateral, mild stage: Secondary | ICD-10-CM | POA: Diagnosis not present

## 2018-02-23 DIAGNOSIS — I129 Hypertensive chronic kidney disease with stage 1 through stage 4 chronic kidney disease, or unspecified chronic kidney disease: Secondary | ICD-10-CM | POA: Diagnosis not present

## 2018-02-23 DIAGNOSIS — F0390 Unspecified dementia without behavioral disturbance: Secondary | ICD-10-CM | POA: Diagnosis not present

## 2018-02-23 DIAGNOSIS — M47816 Spondylosis without myelopathy or radiculopathy, lumbar region: Secondary | ICD-10-CM | POA: Diagnosis not present

## 2018-02-23 DIAGNOSIS — I25118 Atherosclerotic heart disease of native coronary artery with other forms of angina pectoris: Secondary | ICD-10-CM | POA: Diagnosis not present

## 2018-02-23 DIAGNOSIS — I2584 Coronary atherosclerosis due to calcified coronary lesion: Secondary | ICD-10-CM | POA: Diagnosis not present

## 2018-02-23 DIAGNOSIS — I6523 Occlusion and stenosis of bilateral carotid arteries: Secondary | ICD-10-CM | POA: Diagnosis not present

## 2018-03-02 DIAGNOSIS — H401131 Primary open-angle glaucoma, bilateral, mild stage: Secondary | ICD-10-CM | POA: Diagnosis not present

## 2018-03-03 DIAGNOSIS — C4442 Squamous cell carcinoma of skin of scalp and neck: Secondary | ICD-10-CM | POA: Diagnosis not present

## 2018-03-09 DIAGNOSIS — I25118 Atherosclerotic heart disease of native coronary artery with other forms of angina pectoris: Secondary | ICD-10-CM | POA: Diagnosis not present

## 2018-03-09 DIAGNOSIS — I6523 Occlusion and stenosis of bilateral carotid arteries: Secondary | ICD-10-CM | POA: Diagnosis not present

## 2018-03-09 DIAGNOSIS — F0390 Unspecified dementia without behavioral disturbance: Secondary | ICD-10-CM | POA: Diagnosis not present

## 2018-03-09 DIAGNOSIS — M47816 Spondylosis without myelopathy or radiculopathy, lumbar region: Secondary | ICD-10-CM | POA: Diagnosis not present

## 2018-03-09 DIAGNOSIS — I2584 Coronary atherosclerosis due to calcified coronary lesion: Secondary | ICD-10-CM | POA: Diagnosis not present

## 2018-03-09 DIAGNOSIS — I129 Hypertensive chronic kidney disease with stage 1 through stage 4 chronic kidney disease, or unspecified chronic kidney disease: Secondary | ICD-10-CM | POA: Diagnosis not present

## 2018-03-14 ENCOUNTER — Other Ambulatory Visit (HOSPITAL_COMMUNITY): Payer: Self-pay | Admitting: Cardiology

## 2018-03-15 ENCOUNTER — Encounter (HOSPITAL_COMMUNITY): Payer: Self-pay

## 2018-03-15 ENCOUNTER — Emergency Department (HOSPITAL_COMMUNITY)
Admission: EM | Admit: 2018-03-15 | Discharge: 2018-03-15 | Disposition: A | Discharge status: Home / Self Care | Payer: Medicare Other | Attending: Emergency Medicine | Admitting: Emergency Medicine

## 2018-03-15 ENCOUNTER — Other Ambulatory Visit (HOSPITAL_COMMUNITY): Payer: Self-pay | Admitting: Cardiology

## 2018-03-15 DIAGNOSIS — Z5321 Procedure and treatment not carried out due to patient leaving prior to being seen by health care provider: Secondary | ICD-10-CM | POA: Insufficient documentation

## 2018-03-15 DIAGNOSIS — R42 Dizziness and giddiness: Secondary | ICD-10-CM | POA: Diagnosis not present

## 2018-03-15 LAB — CBC
HCT: 43.2 % (ref 39.0–52.0)
HEMOGLOBIN: 13.4 g/dL (ref 13.0–17.0)
MCH: 30.4 pg (ref 26.0–34.0)
MCHC: 31 g/dL (ref 30.0–36.0)
MCV: 98 fL (ref 80.0–100.0)
Platelets: 175 10*3/uL (ref 150–400)
RBC: 4.41 MIL/uL (ref 4.22–5.81)
RDW: 13.4 % (ref 11.5–15.5)
WBC: 5.3 10*3/uL (ref 4.0–10.5)
nRBC: 0 % (ref 0.0–0.2)

## 2018-03-15 LAB — BASIC METABOLIC PANEL
Anion gap: 11 (ref 5–15)
BUN: 20 mg/dL (ref 8–23)
CO2: 22 mmol/L (ref 22–32)
Calcium: 9.6 mg/dL (ref 8.9–10.3)
Chloride: 111 mmol/L (ref 98–111)
Creatinine, Ser: 1.26 mg/dL — ABNORMAL HIGH (ref 0.61–1.24)
GFR calc Af Amer: 57 mL/min — ABNORMAL LOW (ref >60)
GFR, EST NON AFRICAN AMERICAN: 49 mL/min — AB (ref >60)
Glucose, Bld: 153 mg/dL — ABNORMAL HIGH (ref 70–99)
Potassium: 4 mmol/L (ref 3.5–5.1)
Sodium: 144 mmol/L (ref 135–145)

## 2018-03-15 MED ORDER — SODIUM CHLORIDE 0.9% FLUSH: 3.0000 mL | Freq: Once | INTRAVENOUS | Status: DC

## 2018-03-15 NOTE — ED Notes (Signed)
Pt wife up to sort desk wanting to leave, this RN encouraged pt to stay but pt and family did not want to wait any longer.

## 2018-03-15 NOTE — ED Triage Notes (Signed)
Pt reports the home health nurse recommended he come here to get evaluated due to dizziness and "not breathing well" yesterday and weakness. Family reports today he has been able to get up and eat breakfast this morning but c.o some dizziness. Pt alert upon arrival, nad noted.

## 2018-03-17 DIAGNOSIS — I10 Essential (primary) hypertension: Secondary | ICD-10-CM | POA: Diagnosis not present

## 2018-03-17 DIAGNOSIS — M6281 Muscle weakness (generalized): Secondary | ICD-10-CM | POA: Diagnosis not present

## 2018-03-17 DIAGNOSIS — Z6823 Body mass index (BMI) 23.0-23.9, adult: Secondary | ICD-10-CM | POA: Diagnosis not present

## 2018-03-21 DIAGNOSIS — F0390 Unspecified dementia without behavioral disturbance: Secondary | ICD-10-CM | POA: Diagnosis not present

## 2018-03-21 DIAGNOSIS — I25118 Atherosclerotic heart disease of native coronary artery with other forms of angina pectoris: Secondary | ICD-10-CM | POA: Diagnosis not present

## 2018-03-21 DIAGNOSIS — I129 Hypertensive chronic kidney disease with stage 1 through stage 4 chronic kidney disease, or unspecified chronic kidney disease: Secondary | ICD-10-CM | POA: Diagnosis not present

## 2018-03-21 DIAGNOSIS — I2584 Coronary atherosclerosis due to calcified coronary lesion: Secondary | ICD-10-CM | POA: Diagnosis not present

## 2018-03-21 DIAGNOSIS — I6523 Occlusion and stenosis of bilateral carotid arteries: Secondary | ICD-10-CM | POA: Diagnosis not present

## 2018-03-21 DIAGNOSIS — M47816 Spondylosis without myelopathy or radiculopathy, lumbar region: Secondary | ICD-10-CM | POA: Diagnosis not present

## 2018-03-24 DIAGNOSIS — D485 Neoplasm of uncertain behavior of skin: Secondary | ICD-10-CM | POA: Diagnosis not present

## 2018-04-01 DIAGNOSIS — J069 Acute upper respiratory infection, unspecified: Secondary | ICD-10-CM | POA: Diagnosis not present

## 2018-04-01 DIAGNOSIS — I129 Hypertensive chronic kidney disease with stage 1 through stage 4 chronic kidney disease, or unspecified chronic kidney disease: Secondary | ICD-10-CM | POA: Diagnosis not present

## 2018-04-01 DIAGNOSIS — J90 Pleural effusion, not elsewhere classified: Secondary | ICD-10-CM | POA: Diagnosis not present

## 2018-04-01 DIAGNOSIS — Z6823 Body mass index (BMI) 23.0-23.9, adult: Secondary | ICD-10-CM | POA: Diagnosis not present

## 2018-04-01 DIAGNOSIS — R05 Cough: Secondary | ICD-10-CM | POA: Diagnosis not present

## 2018-04-01 DIAGNOSIS — N183 Chronic kidney disease, stage 3 (moderate): Secondary | ICD-10-CM | POA: Diagnosis not present

## 2018-04-07 ENCOUNTER — Other Ambulatory Visit: Payer: Self-pay | Admitting: Cardiology

## 2018-04-08 DIAGNOSIS — I110 Hypertensive heart disease with heart failure: Secondary | ICD-10-CM | POA: Diagnosis not present

## 2018-04-08 DIAGNOSIS — I509 Heart failure, unspecified: Secondary | ICD-10-CM | POA: Diagnosis not present

## 2018-04-08 DIAGNOSIS — R05 Cough: Secondary | ICD-10-CM | POA: Diagnosis not present

## 2018-04-08 DIAGNOSIS — M6281 Muscle weakness (generalized): Secondary | ICD-10-CM | POA: Diagnosis not present

## 2018-04-08 DIAGNOSIS — J90 Pleural effusion, not elsewhere classified: Secondary | ICD-10-CM | POA: Diagnosis not present

## 2018-04-08 DIAGNOSIS — Z6822 Body mass index (BMI) 22.0-22.9, adult: Secondary | ICD-10-CM | POA: Diagnosis not present

## 2018-04-15 DIAGNOSIS — C4442 Squamous cell carcinoma of skin of scalp and neck: Secondary | ICD-10-CM | POA: Diagnosis not present

## 2018-04-15 DIAGNOSIS — L821 Other seborrheic keratosis: Secondary | ICD-10-CM | POA: Diagnosis not present

## 2018-04-30 ENCOUNTER — Other Ambulatory Visit: Payer: Self-pay | Admitting: Cardiology

## 2018-05-06 DIAGNOSIS — I130 Hypertensive heart and chronic kidney disease with heart failure and stage 1 through stage 4 chronic kidney disease, or unspecified chronic kidney disease: Secondary | ICD-10-CM | POA: Diagnosis not present

## 2018-05-06 DIAGNOSIS — I509 Heart failure, unspecified: Secondary | ICD-10-CM | POA: Diagnosis not present

## 2018-05-06 DIAGNOSIS — J90 Pleural effusion, not elsewhere classified: Secondary | ICD-10-CM | POA: Diagnosis not present

## 2018-05-06 DIAGNOSIS — N183 Chronic kidney disease, stage 3 (moderate): Secondary | ICD-10-CM | POA: Diagnosis not present

## 2018-05-06 DIAGNOSIS — Z6822 Body mass index (BMI) 22.0-22.9, adult: Secondary | ICD-10-CM | POA: Diagnosis not present

## 2018-05-13 ENCOUNTER — Other Ambulatory Visit: Payer: Self-pay | Admitting: Cardiology

## 2018-06-06 DIAGNOSIS — N183 Chronic kidney disease, stage 3 (moderate): Secondary | ICD-10-CM | POA: Diagnosis not present

## 2018-06-06 DIAGNOSIS — I130 Hypertensive heart and chronic kidney disease with heart failure and stage 1 through stage 4 chronic kidney disease, or unspecified chronic kidney disease: Secondary | ICD-10-CM | POA: Diagnosis not present

## 2018-06-06 DIAGNOSIS — I509 Heart failure, unspecified: Secondary | ICD-10-CM | POA: Diagnosis not present

## 2018-06-06 DIAGNOSIS — R413 Other amnesia: Secondary | ICD-10-CM | POA: Diagnosis not present

## 2018-06-06 DIAGNOSIS — R079 Chest pain, unspecified: Secondary | ICD-10-CM | POA: Diagnosis not present

## 2018-06-27 DIAGNOSIS — R079 Chest pain, unspecified: Secondary | ICD-10-CM | POA: Diagnosis not present

## 2018-06-27 DIAGNOSIS — I509 Heart failure, unspecified: Secondary | ICD-10-CM | POA: Diagnosis not present

## 2018-06-27 DIAGNOSIS — E785 Hyperlipidemia, unspecified: Secondary | ICD-10-CM | POA: Diagnosis not present

## 2018-06-27 DIAGNOSIS — I130 Hypertensive heart and chronic kidney disease with heart failure and stage 1 through stage 4 chronic kidney disease, or unspecified chronic kidney disease: Secondary | ICD-10-CM | POA: Diagnosis not present

## 2018-06-27 DIAGNOSIS — M6281 Muscle weakness (generalized): Secondary | ICD-10-CM | POA: Diagnosis not present

## 2018-06-27 DIAGNOSIS — R413 Other amnesia: Secondary | ICD-10-CM | POA: Diagnosis not present

## 2018-06-27 DIAGNOSIS — R7302 Impaired glucose tolerance (oral): Secondary | ICD-10-CM | POA: Diagnosis not present

## 2018-06-27 DIAGNOSIS — R600 Localized edema: Secondary | ICD-10-CM | POA: Diagnosis not present

## 2018-06-27 DIAGNOSIS — N183 Chronic kidney disease, stage 3 (moderate): Secondary | ICD-10-CM | POA: Diagnosis not present

## 2018-07-07 ENCOUNTER — Other Ambulatory Visit: Payer: Self-pay

## 2018-07-07 ENCOUNTER — Ambulatory Visit (INDEPENDENT_AMBULATORY_CARE_PROVIDER_SITE_OTHER): Payer: Medicare Other | Admitting: Podiatry

## 2018-07-07 ENCOUNTER — Encounter: Payer: Self-pay | Admitting: Podiatry

## 2018-07-07 VITALS — BP 117/58 | HR 82 | Resp 16

## 2018-07-07 DIAGNOSIS — M79674 Pain in right toe(s): Secondary | ICD-10-CM

## 2018-07-07 DIAGNOSIS — B351 Tinea unguium: Secondary | ICD-10-CM | POA: Diagnosis not present

## 2018-07-07 DIAGNOSIS — L600 Ingrowing nail: Secondary | ICD-10-CM

## 2018-07-07 DIAGNOSIS — H401131 Primary open-angle glaucoma, bilateral, mild stage: Secondary | ICD-10-CM | POA: Diagnosis not present

## 2018-07-07 DIAGNOSIS — M79675 Pain in left toe(s): Secondary | ICD-10-CM | POA: Diagnosis not present

## 2018-07-07 NOTE — Progress Notes (Signed)
   Subjective:    Patient ID: Justin [name], male    DOB: [birth year], 85 y.o.   MRN: [id number]  HPI    Review of Systems  All other systems reviewed and are negative.      Objective:   Physical Exam        Assessment & Plan:

## 2018-07-07 NOTE — Progress Notes (Signed)
Subjective:   Patient ID: Justin [name], male   DOB: 85 y.o.   MRN: [id number]   HPI Patient presents stating that his nails bother him and he cannot cut and that his big toenail on the right has been sore in the corner and his family or caregivers have been trying to trim it themselves.  Patient does not smoke likes to be active   Review of Systems  All other systems reviewed and are negative.       Objective:  Physical Exam Vitals signs and nursing note reviewed.  Constitutional:      Appearance: He is well-developed.  Pulmonary:     Effort: Pulmonary effort is normal.  Musculoskeletal: Normal range of motion.  Skin:    General: Skin is warm.  Neurological:     Mental Status: He is alert.     Neurovascular status found to be mildly diminished but intact with patient found to have good digital perfusion well oriented.  Patient is found to have thickened nailbeds 1-5 both feet that are dystrophic and there is an incurvation of the medial border of the right hallux that is localized with no erythema or edema drainage present associated with it     Assessment:  Ingrown toenail deformity right hallux with mycotic nail infection 1-5 both feet     Plan:  H&P discussed both conditions and discussed possibility for permanent procedure for the right hallux.  At this point aggressive debridement accomplished with no iatrogenic bleeding and he is on blood thinners this was done carefully and smoothing accomplished and discussed again if ingrown toenail removal is necessary with the procedure would be.  Reappoint if symptoms persist

## 2018-07-11 ENCOUNTER — Other Ambulatory Visit: Payer: Self-pay

## 2018-07-11 DIAGNOSIS — I714 Abdominal aortic aneurysm, without rupture, unspecified: Secondary | ICD-10-CM

## 2018-07-11 DIAGNOSIS — I6523 Occlusion and stenosis of bilateral carotid arteries: Secondary | ICD-10-CM

## 2018-07-12 ENCOUNTER — Ambulatory Visit: Payer: Medicare Other | Admitting: Family

## 2018-07-12 ENCOUNTER — Encounter (HOSPITAL_COMMUNITY): Payer: Medicare Other

## 2018-07-14 ENCOUNTER — Other Ambulatory Visit: Payer: Self-pay

## 2018-07-14 ENCOUNTER — Ambulatory Visit (HOSPITAL_COMMUNITY)
Admission: RE | Admit: 2018-07-14 | Discharge: 2018-07-14 | Disposition: A | Discharge status: Home / Self Care | Payer: Medicare Other | Source: Ambulatory Visit | Attending: Family | Admitting: Family

## 2018-07-14 ENCOUNTER — Encounter: Payer: Self-pay | Admitting: Family

## 2018-07-14 ENCOUNTER — Ambulatory Visit (INDEPENDENT_AMBULATORY_CARE_PROVIDER_SITE_OTHER)
Admission: RE | Admit: 2018-07-14 | Discharge: 2018-07-14 | Disposition: A | Discharge status: Home / Self Care | Payer: Medicare Other | Source: Ambulatory Visit | Attending: Family | Admitting: Family

## 2018-07-14 ENCOUNTER — Ambulatory Visit (INDEPENDENT_AMBULATORY_CARE_PROVIDER_SITE_OTHER): Payer: Medicare Other | Admitting: Family

## 2018-07-14 VITALS — BP 142/80 | HR 68 | Temp 97.6°F | Resp 14 | Ht 68.0 in | Wt 150.5 lb

## 2018-07-14 DIAGNOSIS — I714 Abdominal aortic aneurysm, without rupture, unspecified: Secondary | ICD-10-CM

## 2018-07-14 DIAGNOSIS — Z95828 Presence of other vascular implants and grafts: Secondary | ICD-10-CM

## 2018-07-14 DIAGNOSIS — Z87891 Personal history of nicotine dependence: Secondary | ICD-10-CM

## 2018-07-14 DIAGNOSIS — I6523 Occlusion and stenosis of bilateral carotid arteries: Secondary | ICD-10-CM | POA: Insufficient documentation

## 2018-07-14 NOTE — Progress Notes (Signed)
[facility]   [facility]: Follow up s/p Endovascular Repair of Abdominal Aortic Aneurysm and extracranial carotid artery stenosis  History of Present Illness  Justin [name] is a 85 y.o. (1923-08-26) male whois status post endovascular repair of a 5.5 cm infrarenal abdominal aortic aneurysm on 06/03/2016. His postoperative course was uncomplicated. We are also monitoring his extracranial carotid artery stenosis.  Dr. Linna Caprice evaluated pt on 03-29-17 At that time aortic diameterwas 5.4 cm. No evidence of endoleak Dr. Marnee Guarneri ultrasound in 6 months. If this shows no significant change, we can follow this on an annual basis Carotid stenosis: The patient remainedasymptomatic. Follow upcarotid duplex in 6 months. Dr. Trula Slade also evaluated pt on 07-20-16. At that timehis pedal pulses were palpablebilaterally. No significant edema.At that time the patient complainedof some low back pain as well as some numbness in his legs which didnot bother him that much. His CT scan showedthat the stent graftwas in good position without evidence of endoleak.  Pt has seen Dr. Vertell Limber for his back pain, has had 2 ESI's, states the pain was helped temporarily, is due for another in July 2019. Pt denies abdominal pain. He does have GERD.  He had several lumbar spine surgeries. He denies symptoms referable to claudication in his legs with walking.  The patient denies history of stroke or TIA symptoms.  He had an MI in October 2019, his cardiologist is Dr. Virgina Jock with [facility]. Heart cath done, no intervention (Dr. Bonney Roussel note reviewed).   His wife states his blood pressure at home is 150's/80's.   He takes a daily 81 mg ASA, Plavix, and Crestor.  Pt states he has had a cough for the last 2 weeks that is worse when supine. He saw his PCP recently, but before he he had this cough.   Diabetic:No Tobaccos VHQ:IONGEX  smoker, quit in 1980   Past Medical History:  Diagnosis Date  . AAA (abdominal aortic aneurysm) ([place])    "we've known about it since ~ 2000"  . Arthritis    in back and hands:  Gout  . Atrial fibrillation ([place])    a. 04/2011 post op  . BPH (benign prostatic hyperplasia)   . Bronchitis    hx of; "once or twice in my life" (04/21/11)  . Cancer ([place])    Basal Cell carcinoma- per Dr Jacquiline Doe office notes  . Carotid artery disease ([place])   . Chronic kidney disease    stage 3 - per Dr Jacquiline Doe office notes  . Chronic lower back pain 1995  . Eczema   . GERD (gastroesophageal reflux disease)   . Gout of big toe    "h/o in both"  . Hiatal hernia   . History of kidney stones   . Hypertension   . Lumbar spondylosis   . Lumbar stenosis    a. s/p  anterolateral decompression and lateral plating 04/21/2011  . Migraine    "left ~ 1980's"  . Stomach ulcer    "years ago; treated w/diet"    Past Surgical History:  Procedure Laterality Date  . ABDOMINAL AORTA STENT  06/03/2016   ABDOMINAL AORTIC ENDOVASCULAR STENT GRAFT insertion (N/A)  . ABDOMINAL AORTIC ENDOVASCULAR STENT GRAFT N/A 06/03/2016   Procedure: ABDOMINAL AORTIC ENDOVASCULAR STENT GRAFT insertion;  Surgeon: Serafina Mitchell, MD;  Location: [facility];  Service: Vascular;  Laterality: N/A;  . ANTERIOR LAT LUMBAR FUSION  04/21/11   w/PEEK cage, allograft, and lateral plate (r)  . ANTERIOR LAT LUMBAR FUSION  04/21/2011   Procedure: ANTERIOR LATERAL LUMBAR FUSION 1 LEVEL;  Surgeon: Erline Levine, MD;  Location: [facility];  Service: Neurosurgery;  Laterality: Right;  RIGHT Lumbar three-four anterolateral decompression with fusion and lateral plating  . APPENDECTOMY  1946  . BACK SURGERY  04/21/11   "today makes the 5th time"  . CATARACT EXTRACTION, BILATERAL    . EYE SURGERY Bilateral    Cataract  . LEFT HEART CATH AND CORONARY ANGIOGRAPHY N/A 11/22/2017   Procedure: LEFT HEART CATH AND CORONARY ANGIOGRAPHY;  Surgeon: Nigel Mormon, MD;  Location: [facility];  Service: Cardiovascular;  Laterality: N/A;  . PROSTATE SURGERY    . SPINE SURGERY     X's 5   . TONSILLECTOMY     "as a child" and Adneoids   Social History Social History   Tobacco Use  . Smoking status: Former Smoker    Packs/day: 1.00    Years: 20.00    Pack years: 20.00    Types: Cigarettes    Last attempt to quit: 02/10/1980    Years since quitting: 38.4  . Smokeless tobacco: Never Used  Substance Use Topics  . Alcohol use: No  . Drug use: No   Family History Family History  Problem Relation Age of Onset  . Alzheimer's disease Mother        died @ 59  . COPD Father        died @ 79  . Cancer Father   . Hypertension Father   . Heart disease Father   . Stroke Sister   . Cancer Brother        stomach  . Heart disease Brother        Aneurysm  . Hypertension Son   . Heart disease Son        Aneurysm  . Hypertension Son   . Heart disease Son        Aneurysm  . Anesthesia problems Neg Hx   . Hypotension Neg Hx   . Malignant hyperthermia Neg Hx   . Pseudochol deficiency Neg Hx    Current Outpatient Medications on File Prior to Visit  Medication Sig Dispense Refill  . acetaminophen (TYLENOL) 500 MG tablet Take 1,000 mg by mouth daily as needed (back pain).    Marland Kitchen aspirin EC 81 MG tablet Take 81 mg by mouth daily.     . brimonidine (ALPHAGAN) 0.2 % ophthalmic solution Place 1 drop into both eyes 2 (two) times daily.     . clopidogrel (PLAVIX) 75 MG tablet TAKE 1 TABLET BY MOUTH DAILY 30 tablet 3  . COLCRYS 0.6 MG tablet TK 1 T PO D FOR GOUT FLARES    . docusate sodium (COLACE) 100 MG capsule Take 1 capsule (100 mg total) by mouth 2 (two) times daily as needed for mild constipation. 60 capsule 2  . donepezil (ARICEPT) 5 MG tablet Take 5 mg by mouth daily.  4  . dorzolamide-timolol (COSOPT) 22.3-6.8 MG/ML ophthalmic solution Place 1 drop into both eyes 2 (two) times daily.     Marland Kitchen dutasteride (AVODART) 0.5 MG capsule Take 0.5  mg by mouth daily.     . FEROSUL 325 (65 Fe) MG tablet TAKE 1 TABLET BY MOUTH TWICE DAILY 180 tablet 1  . lisinopril (PRINIVIL,ZESTRIL) 20 MG tablet TAKE 1 TABLET BY MOUTH EVERY DAY 90 tablet 3  . metoprolol succinate (TOPROL-XL) 25 MG 24 hr tablet TAKE 1 TABLET BY MOUTH DAILY 30 tablet 3  .  nitroGLYCERIN (NITROSTAT) 0.4 MG SL tablet Place 1 tablet (0.4 mg total) under the tongue every 5 (five) minutes x 3 doses as needed for chest pain. 30 tablet 3  . potassium chloride (K-DUR) 10 MEQ tablet TK 1 T PO QD    . ranolazine (RANEXA) 500 MG 12 hr tablet TAKE 1 TABLET BY MOUTH TWICE DAILY 180 tablet 1  . rosuvastatin (CRESTOR) 10 MG tablet TAKE 1 TABLET BY MOUTH DAILY AT 6 PM 30 tablet 3  . traMADol (ULTRAM) 50 MG tablet Take 50 mg by mouth daily.  0  . TRAVATAN Z 0.004 % SOLN ophthalmic solution Place 1 drop into the left eye See admin instructions. On odd days     No current facility-administered medications on file prior to visit.    Allergies  Allergen Reactions  . Demerol Anaphylaxis     ROS: See HPI for pertinent positives and negatives.  Physical Examination  Vitals:   07/14/18 1027 07/14/18 1029  BP: (!) 150/85 (!) 142/80  Pulse: 68   Resp: 14   Temp: 97.6 F (36.4 C)   TempSrc: Oral   SpO2: 98%   Weight: 150 lb 8 oz (68.3 kg)   Height: 5\' 8"  (1.727 m)    Body mass index is 22.88 kg/m.  General: A&O x 3, WD, fit appearing elderly male HEENT: No gross abnormalities  Pulmonary: Sym exp, respirations are non labored, good air movement in all fields CTAB, no rales, rhonchi, or wheezing. Cardiac: Regular rhythm and rate, no murmur appreciated  Vascular: Vessel Right Left  Radial 2+Palpable 2+Palpable  Carotid  without bruit  without bruit  Aorta Not palpable N/A  Femoral Seated, not Palpable Seated, not Palpable  Popliteal Not palpable Not palpable  PT 2+Palpable 2+Palpable  DP Not Palpable Not Palpable   Gastrointestinal: soft, NTND, -G/R, - HSM, - palpable masses,  - CVAT B. Musculoskeletal: M/S 5/5 throughout, extremities without ischemic changes. Skin: No rashes, no ulcers, no cellulitis.   Neurologic: Pain and light touch intact in extremities, CN2-12 grossly intact except for significant hearing loss, Motor exam as listed above. Psychiatric: Normal thought content, mood appropriate for clinical situation.    DATA  Caroid Duplex (07-14-18): Right Carotid: Velocities in the right ICA are consistent with a 60-79%                stenosis. Left Carotid: Velocities in the left ICA are consistent with a 60-79% stenosis. Vertebrals:  Bilateral vertebral arteries demonstrate antegrade flow. Subclavians: Normal flow hemodynamics were seen in bilateral subclavian              arteries. No change compared to 01-10-18.   EVAR Duplex   Previous(Date:07-20-17)  AAA sac size:5.4cm; Right CIA: 1.7 cm; Left CIA:1.5 cm  noendoleak detected   Current (Date: 07-14-18)  AAA sac size: 5.1 cm; Right CIA: 1.5 cm; Left CIA: 1.5 cm  no endoleak detected   CTA Abd/Pelvis(Date:05-19-16) Aorta: Infrarenal abdominal aortic aneurysm measuring up to 5.5 cm. Large amount of mural thrombus within this aneurysm particularly along the right posterior aspect. 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 The abdominal aortic aneurysm terminates above the aortic bifurcation. Celiac: Celiac trunk is patent without aneurysm or dissection. Mild stenosis of the origin related to calcified plaque. SMA: Patent without significant stenosis and no evidence for dissection. Small amount of calcified plaque at the origin. Renals: Single bilateral renal arteries with calcified plaque near the origins. No significant renal  artery stenosis. Ectasia at the distal right renal artery measuring up to 0.7 cm. IMA: Patent without evidence of aneurysm, dissection, vasculitis or significant stenosis. Inflow: Peripheral calcifications  involving the right iliac arteries without significant stenosis. Right common iliac artery is mildly tortuous. Calcified plaque in left common iliac artery with mild narrowing. Internal and external iliac arteries are patent bilaterally. Proximal Outflow: Atherosclerotic calcifications in the common femoral arteries bilaterally. No significant stenosis. Proximal femoral arteries are patent bilaterally. Veins: Main portal venous system is patent. IVC and renal veins are Patent.   Normal bilateral ABI's on 05-27-16 with bi and triphasic waveforms.    Medical Decision Making  Keigen D Czajkowski is a 85 y.o. male who is s/pEVAR (Date: 06/03/2016). He also has bilateral extracranial carotid artery stenosis. He has no history of stroke or TIA. He has known lumbar spine issues, and sees Dr. Vertell Limber for this He has no new back pain.  I spoke with pt daughter in law, Kelly Eisler, at her request and pt was in agreement. She stares that she and her husband, pt's son, take care of Mr. Newbern and his wife's affairs, and that Mr. Carreiro dementia is worsening a bit. Pt was coherent, oriented x3 during his exam. He has significant hearing loss.   Fortunatley he does not have DM and quit tobacco use in 1980.  He takes a daily 81 mg ASA, Plavix, and Crestor.   I discussed with the patient the importance of surveillance of the endograft.  The next endograft duplex will be scheduled for 12 months, 6 months for carotid duplex.  I emphasized the importance of maximal medical management including strict control of blood pressure, blood glucose, and lipid levels, antiplatelet agents, obtaining regular exercise, and cessation of smoking.   Thank you for allowing [place] to participate in this patient's care.  Clemon Chambers, RN, MSN, FNP-C [facility] Office: [phone]  Clinic Physician: Laqueta Due  07/14/2018, 10:35 AM

## 2018-07-14 NOTE — Patient Instructions (Signed)
Before your next abdominal ultrasound: ° °Avoid gas forming foods and beverages the day before the test.   °Take two Extra-Strength Gas-X capsules at bedtime the night before the test. °Take another two Extra-Strength Gas-X capsules in the middle of the night if you get up to the restroom, if not, first thing in the morning with water.  °Do not chew gum.  ° ° ° °Stroke Prevention °Some medical conditions and lifestyle choices can lead to a higher risk for a stroke. You can help to prevent a stroke by making nutrition, lifestyle, and other changes. °What nutrition changes can be made? ° °· Eat healthy foods. °? Choose foods that are high in fiber. These include: °§ Fresh fruits. °§ Fresh vegetables. °§ Whole grains. °? Eat at least 5 or more servings of fruits and vegetables each day. Try to fill half of your plate at each meal with fruits and vegetables. °? Choose lean protein foods. These include: °§ Lowfat (lean) cuts of meat. °§ Chicken without skin. °§ Fish. °§ Tofu. °§ Beans. °§ Nuts. °? Eat low-fat dairy products. °? Avoid foods that: °§ Are high in salt (sodium). °§ Have saturated fat. °§ Have trans fat. °§ Have cholesterol. °§ Are processed. °§ Are premade. °· Follow eating guidelines as told by your doctor. These may include: °? Reducing how many calories you eat and drink each day. °? Limiting how much salt you eat or drink each day to 1,500 milligrams (mg). °? Using only healthy fats for cooking. These include: °§ Olive oil. °§ Canola oil. °§ Sunflower oil. °? Counting how many carbohydrates you eat and drink each day. °What lifestyle changes can be made? °· Try to stay at a healthy weight. Talk to your doctor about what a good weight is for you. °· Get at least 30 minutes of moderate physical activity at least 5 days a week. This can include: °? Fast walking. °? Biking. °? Swimming. °· Do not use any products that have nicotine or tobacco. This includes cigarettes and e-cigarettes. If you need help  quitting, ask your doctor. Avoid being around tobacco smoke in general. °· Limit how much alcohol you drink to no more than 1 drink a day for nonpregnant women and 2 drinks a day for men. One drink equals 12 oz of beer, 5 oz of wine, or 1½ oz of hard liquor. °· Do not use drugs. °· Avoid taking birth control pills. Talk to your doctor about the risks of taking birth control pills if: °? You are over 35 years old. °? You smoke. °? You get migraines. °? You have had a blood clot. °What other changes can be made? °· Manage your cholesterol. °? It is important to eat a healthy diet. °? If your cholesterol cannot be managed through your diet, you may also need to take medicines. Take medicines as told by your doctor. °· Manage your diabetes. °? It is important to eat a healthy diet and to exercise regularly. °? If your blood sugar cannot be managed through diet and exercise, you may need to take medicines. Take medicines as told by your doctor. °· Control your high blood pressure (hypertension). °? Try to keep your blood pressure below 130/80. This can help lower your risk of stroke. °? It is important to eat a healthy diet and to exercise regularly. °? If your blood pressure cannot be managed through diet and exercise, you may need to take medicines. Take medicines as told by your doctor. °?   Ask your doctor if you should check your blood pressure at home. ? Have your blood pressure checked every year. Do this even if your blood pressure is normal.  Talk to your doctor about getting checked for a sleep disorder. Signs of this can include: ? Snoring a lot. ? Feeling very tired.  Take over-the-counter and prescription medicines only as told by your doctor. These may include aspirin or blood thinners (antiplatelets or anticoagulants).  Make sure that any other medical conditions you have are managed. Where to find more information  American Stroke Association: www.strokeassociation.org  National Stroke  Association: www.stroke.org Get help right away if:  You have any symptoms of stroke. "BE FAST" is an easy way to remember the main warning signs: ? B - Balance. Signs are dizziness, sudden trouble walking, or loss of balance. ? E - Eyes. Signs are trouble seeing or a sudden change in how you see. ? F - Face. Signs are sudden weakness or loss of feeling of the face, or the face or eyelid drooping on one side. ? A - Arms. Signs are weakness or loss of feeling in an arm. This happens suddenly and usually on one side of the body. ? S - Speech. Signs are sudden trouble speaking, slurred speech, or trouble understanding what people say. ? T - Time. Time to call emergency services. Write down what time symptoms started.  You have other signs of stroke, such as: ? A sudden, very bad headache with no known cause. ? Feeling sick to your stomach (nausea). ? Throwing up (vomiting). ? Jerky movements you cannot control (seizure). These symptoms may represent a serious problem that is an emergency. Do not wait to see if the symptoms will go away. Get medical help right away. Call your local emergency services (911 in the [place] Do not drive yourself to the hospital. Summary  You can prevent a stroke by eating healthy, exercising, not smoking, drinking less alcohol, and treating other health problems, such as diabetes, high blood pressure, or high cholesterol.  Do not use any products that contain nicotine or tobacco, such as cigarettes and e-cigarettes.  Get help right away if you have any signs or symptoms of a stroke. This information is not intended to replace advice given to you by your health care provider. Make sure you discuss any questions you have with your health care provider. Document Released: 07/28/2011 Document Revised: 04/29/2016 Document Reviewed: 04/29/2016 Elsevier Interactive Patient Education  2019 [employer].

## 2018-07-19 ENCOUNTER — Telehealth: Payer: Self-pay

## 2018-07-19 ENCOUNTER — Other Ambulatory Visit: Payer: Self-pay | Admitting: Cardiology

## 2018-07-19 MED ORDER — LISINOPRIL 20 MG PO TABS: 20.0000 mg | ORAL_TABLET | Freq: Every day | ORAL | 3 refills | Status: DC

## 2018-07-19 MED ORDER — ROSUVASTATIN CALCIUM 10 MG PO TABS: ORAL_TABLET | ORAL | 3 refills | Status: DC

## 2018-07-19 MED ORDER — CLOPIDOGREL BISULFATE 75 MG PO TABS: 75.0000 mg | ORAL_TABLET | Freq: Every day | ORAL | 3 refills | Status: DC

## 2018-07-19 NOTE — Telephone Encounter (Signed)
Pt request a refill for clopidogrel 75mg , lisinopril 20mg , rosuvastatin 10mg 

## 2018-07-21 ENCOUNTER — Encounter: Payer: Self-pay | Admitting: Cardiology

## 2018-07-21 ENCOUNTER — Other Ambulatory Visit: Payer: Self-pay

## 2018-07-21 ENCOUNTER — Ambulatory Visit (INDEPENDENT_AMBULATORY_CARE_PROVIDER_SITE_OTHER): Payer: Medicare Other | Admitting: Cardiology

## 2018-07-21 VITALS — BP 128/67 | HR 76 | Ht 70.0 in | Wt 155.0 lb

## 2018-07-21 DIAGNOSIS — I6523 Occlusion and stenosis of bilateral carotid arteries: Secondary | ICD-10-CM

## 2018-07-21 DIAGNOSIS — I714 Abdominal aortic aneurysm, without rupture, unspecified: Secondary | ICD-10-CM | POA: Insufficient documentation

## 2018-07-21 DIAGNOSIS — I2510 Atherosclerotic heart disease of native coronary artery without angina pectoris: Secondary | ICD-10-CM

## 2018-07-21 NOTE — Progress Notes (Signed)
Follow up visit  Subjective:   Justin [name], male    DOB: [birth year], 85 y.o.   MRN: [id number]   Chief Complaint  Patient presents with  . Coronary Artery Disease  . Atrial Fibrillation  . Hypertension  . Follow-up    HPI  85 y/o male with severe multivessel CAD, NSTEMI in 11/2017, PAD (b/l mod carotid stenosis), s/p endovascular repair of 5.5 cm infrarenal abdominal aorta aneurysm 06/03/2016, hypertension, h/o postop Afib in 2013 with no reported recurrence.  He was deemed not to be a surgical candidate. Patient has opted against interventional management for his severe CAD. Patient has done well in spite of his comborbidities. He admits that he has "good days and bad days', but enjoys sitting on the porch watching the birds. He denies any chest pain or shortness of breath symptoms.  AAA s/p repair is stable.    Past Medical History:  Diagnosis Date  . AAA (abdominal aortic aneurysm) ([place])    "we've known about it since ~ 2000"  . Arthritis    in back and hands:  Gout  . Atrial fibrillation ([place])    a. 04/2011 post op  . BPH (benign prostatic hyperplasia)   . Bronchitis    hx of; "once or twice in my life" (04/21/11)  . Cancer ([place])    Basal Cell carcinoma- per Dr Jacquiline Doe office notes  . Carotid artery disease ([place])   . Chronic kidney disease    stage 3 - per Dr Jacquiline Doe office notes  . Chronic lower back pain 1995  . Eczema   . GERD (gastroesophageal reflux disease)   . Gout of big toe    "h/o in both"  . Hiatal hernia   . History of kidney stones   . Hypertension   . Lumbar spondylosis   . Lumbar stenosis    a. s/p  anterolateral decompression and lateral plating 04/21/2011  . Migraine    "left ~ 1980's"  . Stomach ulcer    "years ago; treated w/diet"      Past Surgical History:  Procedure Laterality Date  . ABDOMINAL AORTA STENT  06/03/2016   ABDOMINAL AORTIC ENDOVASCULAR STENT GRAFT insertion (N/A)  . ABDOMINAL AORTIC ENDOVASCULAR STENT GRAFT  N/A 06/03/2016   Procedure: ABDOMINAL AORTIC ENDOVASCULAR STENT GRAFT insertion;  Surgeon: Serafina Mitchell, MD;  Location: [facility];  Service: Vascular;  Laterality: N/A;  . ANTERIOR LAT LUMBAR FUSION  04/21/11   w/PEEK cage, allograft, and lateral plate (r)  . ANTERIOR LAT LUMBAR FUSION  04/21/2011   Procedure: ANTERIOR LATERAL LUMBAR FUSION 1 LEVEL;  Surgeon: Erline Levine, MD;  Location: [facility];  Service: Neurosurgery;  Laterality: Right;  RIGHT Lumbar three-four anterolateral decompression with fusion and lateral plating  . APPENDECTOMY  1946  . BACK SURGERY  04/21/11   "today makes the 5th time"  . CATARACT EXTRACTION, BILATERAL    . EYE SURGERY Bilateral    Cataract  . LEFT HEART CATH AND CORONARY ANGIOGRAPHY N/A 11/22/2017   Procedure: LEFT HEART CATH AND CORONARY ANGIOGRAPHY;  Surgeon: Nigel Mormon, MD;  Location: [facility];  Service: Cardiovascular;  Laterality: N/A;  . PROSTATE SURGERY    . SPINE SURGERY     X's 5   . TONSILLECTOMY     "as a child" and Adneoids     Social History   Socioeconomic History  . Marital status: Married    Spouse name: Not on file  . Number of children:  2  . Years of education: Not on file  . Highest education level: Not on file  Occupational History  . Not on file  Social Needs  . Financial resource strain: Not on file  . Food insecurity    Worry: Not on file    Inability: Not on file  . Transportation needs    Medical: Not on file    Non-medical: Not on file  Tobacco Use  . Smoking status: Former Smoker    Packs/day: 1.00    Years: 20.00    Pack years: 20.00    Types: Cigarettes    Quit date: 02/10/1980    Years since quitting: 38.4  . Smokeless tobacco: Never Used  Substance and Sexual Activity  . Alcohol use: No  . Drug use: No  . Sexual activity: Yes  Lifestyle  . Physical activity    Days per week: Not on file    Minutes per session: Not on file  . Stress: Not on file  Relationships  . Social  Herbalist on phone: Not on file    Gets together: Not on file    Attends religious service: Not on file    Active member of club or organization: Not on file    Attends meetings of clubs or organizations: Not on file    Relationship status: Not on file  . Intimate partner violence    Fear of current or ex partner: Not on file    Emotionally abused: Not on file    Physically abused: Not on file    Forced sexual activity: Not on file  Other Topics Concern  . Not on file  Social History Narrative   Lives in [place] his wife.  Retired.  Activity limited by back pain.     Family History  Problem Relation Age of Onset  . Alzheimer's disease Mother        died @ 69  . COPD Father        died @ 6  . Cancer Father   . Hypertension Father   . Heart disease Father   . Stroke Sister   . Cancer Brother        stomach  . Heart disease Brother        Aneurysm  . Hypertension Son   . Heart disease Son        Aneurysm  . Hypertension Son   . Heart disease Son        Aneurysm  . Anesthesia problems Neg Hx   . Hypotension Neg Hx   . Malignant hyperthermia Neg Hx   . Pseudochol deficiency Neg Hx      Current Outpatient Medications on File Prior to Visit  Medication Sig Dispense Refill  . acetaminophen (TYLENOL) 500 MG tablet Take 1,000 mg by mouth daily as needed (back pain).    Marland Kitchen aspirin EC 81 MG tablet Take 81 mg by mouth daily.     . clopidogrel (PLAVIX) 75 MG tablet Take 1 tablet (75 mg total) by mouth daily. 90 tablet 3  . donepezil (ARICEPT) 5 MG tablet Take 5 mg by mouth daily.  4  . dutasteride (AVODART) 0.5 MG capsule Take 0.5 mg by mouth. W,F    . FEROSUL 325 (65 Fe) MG tablet TAKE 1 TABLET BY MOUTH TWICE DAILY 180 tablet 1  . furosemide (LASIX) 20 MG tablet Take 20 mg by mouth. M,W,F    . lisinopril (ZESTRIL) 20 MG tablet Take  1 tablet (20 mg total) by mouth daily. 90 tablet 3  . metoprolol succinate (TOPROL-XL) 25 MG 24 hr tablet TAKE 1 TABLET BY  MOUTH DAILY 30 tablet 3  . Multiple Vitamin (MULTIVITAMIN) capsule Take 1 capsule by mouth daily.    . nitroGLYCERIN (NITROSTAT) 0.4 MG SL tablet Place 1 tablet (0.4 mg total) under the tongue every 5 (five) minutes x 3 doses as needed for chest pain. 30 tablet 3  . potassium chloride (K-DUR) 10 MEQ tablet M,W,F    . ranolazine (RANEXA) 500 MG 12 hr tablet TAKE 1 TABLET BY MOUTH TWICE DAILY 180 tablet 1  . rosuvastatin (CRESTOR) 10 MG tablet TAKE 1 TABLET BY MOUTH DAILY AT 6 PM 90 tablet 3   No current facility-administered medications on file prior to visit.     Cardiovascular studies:  EKG 07/21/2018: Sinus rhythm 79 bpm.  Left axis deviation Left atrial enlargement.  Nonspecific ST depression  Coronary angiography 11/22/2017: LM: Distal LM bifurcation has severely calcified 95% stenosis LAD: Large diag 1 with ostial 95% stenosis          LAD/Diag 2 bifurcation 90% stenosis. Mid diag 2 60% diserase          Rest of the LAD looks fairly normal with good surgical targets on LAD as well as diag vessels LCx: Prox LCx tortuous with 70% disease          Moderate sized OM 1 with long 95% stenosis with good surgical targets  RCA: Prox-mid RCA 50% irregular disease  Normal LVEDP  Severely multivessel CAD  Echocardiogram 11/21/2017: - Left ventricle: The cavity size was normal. There was moderate   concentric hypertrophy. Systolic function was normal. The   estimated ejection fraction was in the range of 55% to 60%. Masse   motion was normal; there were no regional Heinzman motion   abnormalities. Doppler parameters are consistent with abnormal   left ventricular relaxation (grade 1 diastolic dysfunction). - Aortic valve: Moderately calcified annulus. Trileaflet; mildly   calcified leaflets. There was no significant regurgitation.   Inadequate Doppler evaluation to assess stenosis. Visually, does   not appear significant. - Mitral valve: The findings are consistent with mild calcific    stenosis. mean PG 4 mmHg, MVA 1.8 cm2 at HR 74 bpm. Valve area by   pressure half-time: 1.86 cm^2. - Tricuspid valve: There was mild regurgitation. - Pulmonary arteries: PA peak pressure: 29 mm Hg (S).  Recent labs: Results for SAMIER, JACO (MRN [id number]) as of 07/21/2018 20:57  Ref. Range 03/15/2018 43:32  BASIC METABOLIC PANEL Unknown Rpt (A)  Sodium Latest Ref Range: 135 - 145 mmol/L 144  Potassium Latest Ref Range: 3.5 - 5.1 mmol/L 4.0  Chloride Latest Ref Range: 98 - 111 mmol/L 111  CO2 Latest Ref Range: 22 - 32 mmol/L 22  Glucose Latest Ref Range: 70 - 99 mg/dL 153 (H)  BUN Latest Ref Range: 8 - 23 mg/dL 20  Creatinine Latest Ref Range: 0.61 - 1.24 mg/dL 1.26 (H)  Calcium Latest Ref Range: 8.9 - 10.3 mg/dL 9.6  Anion gap Latest Ref Range: 5 - 15  11  GFR, Est Non African American Latest Ref Range: >60 mL/min 49 (L)  GFR, Est African American Latest Ref Range: >60 mL/min 57 (L)   Results for ASEEM, SESSUMS (MRN [id number]) as of 07/21/2018 20:57  Ref. Range 03/15/2018 11:52  WBC Latest Ref Range: 4.0 - 10.5 K/uL 5.3  RBC Latest Ref Range: 4.22 - 5.81 MIL/uL  4.41  Hemoglobin Latest Ref Range: 13.0 - 17.0 g/dL 13.4  HCT Latest Ref Range: 39.0 - 52.0 % 43.2  MCV Latest Ref Range: 80.0 - 100.0 fL 98.0  MCH Latest Ref Range: 26.0 - 34.0 pg 30.4  MCHC Latest Ref Range: 30.0 - 36.0 g/dL 31.0  RDW Latest Ref Range: 11.5 - 15.5 % 13.4  Platelets Latest Ref Range: 150 - 400 K/uL 175  nRBC Latest Ref Range: 0.0 - 0.2 % 0.0    Review of Systems  Constitution: Negative for decreased appetite, malaise/fatigue, weight gain and weight loss.  HENT: Negative for congestion.   Eyes: Negative for visual disturbance.  Cardiovascular: Negative for chest pain, dyspnea on exertion, leg swelling, palpitations and syncope.  Respiratory: Negative for cough.   Endocrine: Negative for cold intolerance.  Hematologic/Lymphatic: Does not bruise/bleed easily.  Skin: Negative for itching and rash.   Musculoskeletal: Negative for myalgias.  Gastrointestinal: Negative for abdominal pain, nausea and vomiting.  Genitourinary: Negative for dysuria.  Neurological: Negative for dizziness and weakness.  Psychiatric/Behavioral: The patient is not nervous/anxious.   All other systems reviewed and are negative.        Vitals:   07/21/18 1319  BP: 128/67  Pulse: 76  SpO2: 95%    Body mass index is 22.24 kg/m. Filed Weights   07/21/18 1319  Weight: 70.3 kg     Objective:   Physical Exam  Constitutional: He is oriented to person, place, and time. He appears well-developed and well-nourished. No distress.  HENT:  Head: Normocephalic and atraumatic.  Eyes: Pupils are equal, round, and reactive to light. Conjunctivae are normal.  Neck: No JVD present.  Cardiovascular: Normal rate and regular rhythm.  Pulses:      Carotid pulses are on the right side with bruit and on the left side with bruit. Pulmonary/Chest: Effort normal and breath sounds normal. He has no wheezes. He has no rales.  Abdominal: Soft. Bowel sounds are normal. There is no rebound.  Musculoskeletal:        General: No edema.  Lymphadenopathy:    He has no cervical adenopathy.  Neurological: He is alert and oriented to person, place, and time. No cranial nerve deficit.  Skin: Skin is warm and dry.  Psychiatric: He has a normal mood and affect.  Nursing note and vitals reviewed.         Assessment & Recommendations:   85 y/o male with severe multivessel CAD, NSTEMI in 11/2017, PAD (b/l mod carotid stenosis), s/p endovascular repair of 5.5 cm infrarenal abdominal aorta aneurysm 06/03/2016, hypertension, h/o postop Afib in 2013 with no reported recurrence.  Coronary artery disease: No angina symptoms. Continue medical management of coronary artery disease including dual antiplatelet therapy with aspirin and Plavix, metoprolol succinate 25 mg daily, lisinopril 20 mg daily, Ranexa 500 mg twice daily, as needed  sublingual nitroglycerin. Continue rosuvastatin 10 mg daily. Patient and family have chosen to be DNR and does not want aggressive invasive management.  Hypertension: Improved compared to last visit. No change made today.  PAD: Continue medical therapy.  S/p endovascular repair of AAA: Stable.  CKD 3: Stable   Follow up in 6 months.    Nigel Mormon, MD [facility] Pager: [phone] Office: [phone] If no answer Cell [phone]

## 2018-07-22 ENCOUNTER — Other Ambulatory Visit: Payer: Self-pay

## 2018-07-22 MED ORDER — CLOPIDOGREL BISULFATE 75 MG PO TABS: 75.0000 mg | ORAL_TABLET | Freq: Every day | ORAL | 3 refills | Status: AC

## 2018-07-22 MED ORDER — ROSUVASTATIN CALCIUM 10 MG PO TABS: ORAL_TABLET | ORAL | 3 refills | Status: AC

## 2018-07-26 ENCOUNTER — Other Ambulatory Visit: Payer: Self-pay

## 2018-07-26 ENCOUNTER — Other Ambulatory Visit: Payer: Self-pay | Admitting: Cardiology

## 2018-07-26 MED ORDER — METOPROLOL SUCCINATE ER 25 MG PO TB24: 25.0000 mg | ORAL_TABLET | Freq: Every day | ORAL | 1 refills | Status: DC

## 2018-07-28 DIAGNOSIS — L814 Other melanin hyperpigmentation: Secondary | ICD-10-CM | POA: Diagnosis not present

## 2018-07-28 DIAGNOSIS — Z85828 Personal history of other malignant neoplasm of skin: Secondary | ICD-10-CM | POA: Diagnosis not present

## 2018-07-28 DIAGNOSIS — L821 Other seborrheic keratosis: Secondary | ICD-10-CM | POA: Diagnosis not present

## 2018-07-28 DIAGNOSIS — L820 Inflamed seborrheic keratosis: Secondary | ICD-10-CM | POA: Diagnosis not present

## 2018-08-08 ENCOUNTER — Ambulatory Visit: Payer: Self-pay | Admitting: Cardiology

## 2018-08-16 DIAGNOSIS — G309 Alzheimer's disease, unspecified: Secondary | ICD-10-CM | POA: Diagnosis not present

## 2018-08-16 DIAGNOSIS — H6122 Impacted cerumen, left ear: Secondary | ICD-10-CM | POA: Diagnosis not present

## 2018-08-16 DIAGNOSIS — T161XXA Foreign body in right ear, initial encounter: Secondary | ICD-10-CM | POA: Diagnosis not present

## 2018-08-16 DIAGNOSIS — H6123 Impacted cerumen, bilateral: Secondary | ICD-10-CM | POA: Diagnosis not present

## 2018-08-16 DIAGNOSIS — Z974 Presence of external hearing-aid: Secondary | ICD-10-CM | POA: Diagnosis not present

## 2018-08-16 DIAGNOSIS — H9113 Presbycusis, bilateral: Secondary | ICD-10-CM | POA: Diagnosis not present

## 2018-08-22 DIAGNOSIS — I10 Essential (primary) hypertension: Secondary | ICD-10-CM | POA: Diagnosis not present

## 2018-09-05 ENCOUNTER — Telehealth: Payer: Self-pay

## 2018-09-05 NOTE — Telephone Encounter (Signed)
Could be related to either aspirin or plavix. Recommend stopping aspirin. Please follow up and let us know later this week.  Thanks MJP

## 2018-09-05 NOTE — Telephone Encounter (Signed)
Pt's daughter in law called to advise that pt is having a dark stook and was told by pcp that it could be coming from one of the medications that we prescribed but not sure which one. Pt is also having some other issues that she will reach out to pcp regarding. Last seen 6/11 Please advise.//ah

## 2018-09-06 DIAGNOSIS — N183 Chronic kidney disease, stage 3 (moderate): Secondary | ICD-10-CM | POA: Diagnosis not present

## 2018-09-06 DIAGNOSIS — R413 Other amnesia: Secondary | ICD-10-CM | POA: Diagnosis not present

## 2018-09-06 DIAGNOSIS — I129 Hypertensive chronic kidney disease with stage 1 through stage 4 chronic kidney disease, or unspecified chronic kidney disease: Secondary | ICD-10-CM | POA: Diagnosis not present

## 2018-09-06 DIAGNOSIS — I739 Peripheral vascular disease, unspecified: Secondary | ICD-10-CM | POA: Diagnosis not present

## 2018-09-06 DIAGNOSIS — R0781 Pleurodynia: Secondary | ICD-10-CM | POA: Diagnosis not present

## 2018-09-06 DIAGNOSIS — K921 Melena: Secondary | ICD-10-CM | POA: Diagnosis not present

## 2018-09-07 NOTE — Telephone Encounter (Signed)
Justin [name] called back to inform testing was normal but decided to leave medications as they are for right and continue to monitor symptoms before making any changes.//ah

## 2018-09-07 NOTE — Telephone Encounter (Signed)
Pt's daughter in law Sharee Pimple is aware but states that pt had stool sample done with pcp and should hear back results today and will let them know MP recommendation to see if still necessary to stop.//ah

## 2018-10-03 ENCOUNTER — Other Ambulatory Visit: Payer: Self-pay

## 2018-10-03 ENCOUNTER — Ambulatory Visit (INDEPENDENT_AMBULATORY_CARE_PROVIDER_SITE_OTHER): Payer: Medicare Other | Admitting: Podiatry

## 2018-10-03 ENCOUNTER — Encounter: Payer: Self-pay | Admitting: Podiatry

## 2018-10-03 VITALS — Temp 98.2°F

## 2018-10-03 DIAGNOSIS — B351 Tinea unguium: Secondary | ICD-10-CM | POA: Diagnosis not present

## 2018-10-03 DIAGNOSIS — M79674 Pain in right toe(s): Secondary | ICD-10-CM | POA: Diagnosis not present

## 2018-10-03 DIAGNOSIS — M79675 Pain in left toe(s): Secondary | ICD-10-CM | POA: Diagnosis not present

## 2018-10-03 DIAGNOSIS — Z9229 Personal history of other drug therapy: Secondary | ICD-10-CM

## 2018-10-03 NOTE — Patient Instructions (Signed)

## 2018-10-11 NOTE — Progress Notes (Signed)
Subjective: Justin [name] is a 85 y.o. y.o. male who is on long term blood thinner Plavix presents today with painful, discolored, thick toenails  which interfere with daily activities. Pain is aggravated when wearing enclosed shoe gear. Pain is relieved with periodic professional debridement.  Patient's PCP is Burnard Bunting, MD and last date seen was 06/06/2018.   Current Outpatient Medications:  .  dorzolamide-timolol (COSOPT) 22.3-6.8 MG/ML ophthalmic solution, Place 1 drop into both eyes 2 times daily., Disp: , Rfl:  .  Travoprost, BAK Free, (TRAVATAN Z) 0.004 % SOLN ophthalmic solution, Place 1 drop into the left eye daily., Disp: , Rfl:  .  acetaminophen (TYLENOL) 500 MG tablet, Take 1,000 mg by mouth daily as needed (back pain)., Disp: , Rfl:  .  aspirin EC 81 MG tablet, Take 81 mg by mouth daily. , Disp: , Rfl:  .  brimonidine (ALPHAGAN) 0.2 % ophthalmic solution, INSTILL 1 DROP INTO BOTH EYES 2 TIMES DAILY, Disp: , Rfl:  .  clopidogrel (PLAVIX) 75 MG tablet, Take 1 tablet (75 mg total) by mouth daily., Disp: 90 tablet, Rfl: 3 .  COLCRYS 0.6 MG tablet, Take 0.6 mg by mouth daily., Disp: , Rfl:  .  donepezil (ARICEPT) 5 MG tablet, Take 5 mg by mouth daily., Disp: , Rfl: 4 .  dutasteride (AVODART) 0.5 MG capsule, Take 0.5 mg by mouth. W,F, Disp: , Rfl:  .  FEROSUL 325 (65 Fe) MG tablet, TAKE 1 TABLET BY MOUTH TWICE DAILY, Disp: 180 tablet, Rfl: 1 .  furosemide (LASIX) 20 MG tablet, Take 20 mg by mouth. M,W,F, Disp: , Rfl:  .  lisinopril (ZESTRIL) 20 MG tablet, Take 1 tablet (20 mg total) by mouth daily., Disp: 90 tablet, Rfl: 3 .  metoprolol succinate (TOPROL-XL) 25 MG 24 hr tablet, Take 1 tablet (25 mg total) by mouth daily., Disp: 90 tablet, Rfl: 1 .  Multiple Vitamin (MULTIVITAMIN) capsule, Take 1 capsule by mouth daily., Disp: , Rfl:  .  nitroGLYCERIN (NITROSTAT) 0.4 MG SL tablet, Place 1 tablet (0.4 mg total) under the tongue every 5 (five) minutes x 3 doses as needed for chest  pain., Disp: 30 tablet, Rfl: 3 .  potassium chloride (K-DUR) 10 MEQ tablet, M,W,F, Disp: , Rfl:  .  ranolazine (RANEXA) 500 MG 12 hr tablet, TAKE 1 TABLET BY MOUTH TWICE DAILY, Disp: 180 tablet, Rfl: 1 .  rosuvastatin (CRESTOR) 10 MG tablet, TAKE 1 TABLET BY MOUTH DAILY AT 6 PM, Disp: 90 tablet, Rfl: 3   Allergies  Allergen Reactions  . Demerol Anaphylaxis     Objective: Vitals:   10/03/18 1419  Temp: 98.2 F (36.8 C)    Vascular Examination: Capillary refill time.<3 seconds x 10 digits.  Dorsalis pedis pulses faintly palpable b/l.  Posterior tibial pulses diminished b/l.  No digital hair x 10 digits absent b/l.  Skin temperature gradient WNL b/l.  Dermatological Examination: Skin thin, shiny and atrophic b/l.  Toenails 1-5 b/l discolored, thick, dystrophic with subungual debris and pain with palpation to nailbeds due to thickness of nails.  Musculoskeletal: Muscle strength 5/5 to all LE muscle groups.  Neurological: Sensation intact with 10 gram monofilament.  Vibratory sensation intact.  Assessment: Painful onychomycosis toenails 1-5 b/l in patient on blood thinner.   Plan: 1. Toenails 1-5 b/l were debrided in length and girth without iatrogenic bleeding. 2. Patient to continue soft, supportive shoe gear daily. 3. Patient to report any pedal injuries to medical professional immediately. 4. Avoid self trimming due  to use of blood thinner. 5. Follow up 3 months.  6. Patient/POA to call should there be a concern in the interim.

## 2018-10-22 DIAGNOSIS — Z23 Encounter for immunization: Secondary | ICD-10-CM | POA: Diagnosis not present

## 2018-10-31 ENCOUNTER — Other Ambulatory Visit: Payer: Self-pay | Admitting: Cardiology

## 2018-12-26 DIAGNOSIS — E041 Nontoxic single thyroid nodule: Secondary | ICD-10-CM | POA: Diagnosis not present

## 2018-12-26 DIAGNOSIS — Z125 Encounter for screening for malignant neoplasm of prostate: Secondary | ICD-10-CM | POA: Diagnosis not present

## 2018-12-26 DIAGNOSIS — R7302 Impaired glucose tolerance (oral): Secondary | ICD-10-CM | POA: Diagnosis not present

## 2018-12-26 DIAGNOSIS — I10 Essential (primary) hypertension: Secondary | ICD-10-CM | POA: Diagnosis not present

## 2018-12-26 DIAGNOSIS — E7849 Other hyperlipidemia: Secondary | ICD-10-CM | POA: Diagnosis not present

## 2018-12-27 ENCOUNTER — Other Ambulatory Visit: Payer: Self-pay

## 2019-01-02 ENCOUNTER — Ambulatory Visit (INDEPENDENT_AMBULATORY_CARE_PROVIDER_SITE_OTHER): Payer: Medicare Other | Admitting: Podiatry

## 2019-01-02 ENCOUNTER — Encounter: Payer: Self-pay | Admitting: Podiatry

## 2019-01-02 ENCOUNTER — Other Ambulatory Visit: Payer: Self-pay

## 2019-01-02 DIAGNOSIS — M79675 Pain in left toe(s): Secondary | ICD-10-CM

## 2019-01-02 DIAGNOSIS — B351 Tinea unguium: Secondary | ICD-10-CM | POA: Diagnosis not present

## 2019-01-02 DIAGNOSIS — M79674 Pain in right toe(s): Secondary | ICD-10-CM | POA: Diagnosis not present

## 2019-01-07 NOTE — Progress Notes (Signed)
Subjective:  Justin [name] presents to clinic today with cc of  painful, thick, discolored, elongated toenails  of both feet that become tender and patient cannot cut because of thickness. Pain is aggravated when wearing enclosed shoe gear and relieved with periodic professional debridement.  He remains on blood thinner, Plavix.  Patient voices no new pedal concerns on today's visit.  Medications reviewed in chart.  Allergies  Allergen Reactions  . Demerol Anaphylaxis     Objective:  Physical Examination:  Vascular Examination: Capillary refill time <3 seconds b/l.   DP pulses 1/4 b/l.   PT pulses diminished b/l.  Digital hair absent b/l.   No edema noted b/l.  Skin temperature gradient WNL b/l.  Dermatological Examination: Skin thin, shiny and atrophic b/l.  No open wounds b/l.  No interdigital macerations noted b/l.  Elongated, thick, discolored brittle toenails with subungual debris and pain on dorsal palpation of nailbeds 1-5 b/l.  Musculoskeletal Examination: Muscle strength 5/5 to all muscle groups b/l.  No pain, crepitus or joint discomfort with active/passive ROM.  Neurological Examination: Sensation intact 5/5 b/l with 10 gram monofilament.  Assessment: Mycotic nail infection with pain 1-5 b/l in patient on long term blood thinner  Plan: 1. Toenails 1-5 b/l were debrided in length and girth without iatrogenic laceration. 2.  Continue soft, supportive shoe gear daily. 3.  Report any pedal injuries to medical professional. 4.  Follow up 3 months. 5.  Patient/POA to call should there be a question/concern in there interim.

## 2019-01-09 DIAGNOSIS — R7302 Impaired glucose tolerance (oral): Secondary | ICD-10-CM | POA: Diagnosis not present

## 2019-01-09 DIAGNOSIS — M19049 Primary osteoarthritis, unspecified hand: Secondary | ICD-10-CM | POA: Diagnosis not present

## 2019-01-09 DIAGNOSIS — I714 Abdominal aortic aneurysm, without rupture: Secondary | ICD-10-CM | POA: Diagnosis not present

## 2019-01-09 DIAGNOSIS — N1831 Chronic kidney disease, stage 3a: Secondary | ICD-10-CM | POA: Diagnosis not present

## 2019-01-09 DIAGNOSIS — I509 Heart failure, unspecified: Secondary | ICD-10-CM | POA: Diagnosis not present

## 2019-01-09 DIAGNOSIS — E785 Hyperlipidemia, unspecified: Secondary | ICD-10-CM | POA: Diagnosis not present

## 2019-01-09 DIAGNOSIS — R600 Localized edema: Secondary | ICD-10-CM | POA: Diagnosis not present

## 2019-01-09 DIAGNOSIS — R413 Other amnesia: Secondary | ICD-10-CM | POA: Diagnosis not present

## 2019-01-09 DIAGNOSIS — I130 Hypertensive heart and chronic kidney disease with heart failure and stage 1 through stage 4 chronic kidney disease, or unspecified chronic kidney disease: Secondary | ICD-10-CM | POA: Diagnosis not present

## 2019-01-09 DIAGNOSIS — M6281 Muscle weakness (generalized): Secondary | ICD-10-CM | POA: Diagnosis not present

## 2019-01-09 DIAGNOSIS — Z0000 Encounter for general adult medical examination without abnormal findings: Secondary | ICD-10-CM | POA: Diagnosis not present

## 2019-01-09 DIAGNOSIS — N401 Enlarged prostate with lower urinary tract symptoms: Secondary | ICD-10-CM | POA: Diagnosis not present

## 2019-01-09 DIAGNOSIS — Z1331 Encounter for screening for depression: Secondary | ICD-10-CM | POA: Diagnosis not present

## 2019-01-09 DIAGNOSIS — Z1339 Encounter for screening examination for other mental health and behavioral disorders: Secondary | ICD-10-CM | POA: Diagnosis not present

## 2019-01-16 DIAGNOSIS — H401131 Primary open-angle glaucoma, bilateral, mild stage: Secondary | ICD-10-CM | POA: Diagnosis not present

## 2019-01-19 ENCOUNTER — Other Ambulatory Visit: Payer: Self-pay | Admitting: Cardiology

## 2019-01-22 IMAGING — NM NM MISC PROCEDURE
11 series · 60 of 60 positions shown · non-contrast
Comparison: none

[Series 1: stress-sum-em · 6.40mm/px · 5 of 64 frames shown]
[frame 6/64]
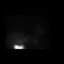
[frame 16/64]
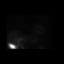
[frame 27/64]
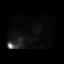
[frame 38/64]
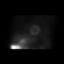
[frame 48/64]
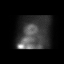

[Series 1: stress-sum-em-mc · 6.40mm/px · 6 of 64 frames shown]
[frame 6/64]
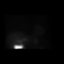
[frame 16/64]
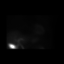
[frame 27/64]
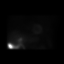
[frame 38/64]
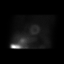
[frame 48/64]
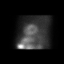
[frame 59/64]
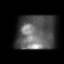

[Series 1: stress-gsp · 6.40mm/px · 5 of 512 frames shown]
[frame 43/512]
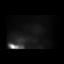
[frame 128/512]
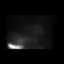
[frame 214/512]
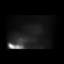
[frame 299/512]
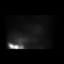
[frame 470/512]
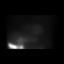

[Series 1: wbr_s-proj_st stress-sum-em · 6.40mm/px · 6 of 64 frames shown]
[frame 6/64]
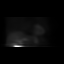
[frame 16/64]
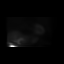
[frame 27/64]
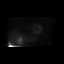
[frame 38/64]
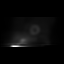
[frame 48/64]
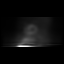
[frame 59/64]
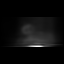

[Series 1: wbr_r-proj_st rest · 6.40mm/px · 5 of 64 frames shown]
[frame 6/64]
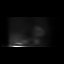
[frame 16/64]
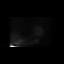
[frame 27/64]
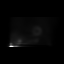
[frame 48/64]
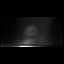
[frame 59/64]
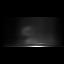

[Series 1: wbr_s-proj_st stress-gsp · 6.40mm/px · 6 of 512 frames shown]
[frame 43/512]
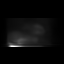
[frame 128/512]
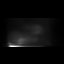
[frame 214/512]
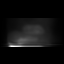
[frame 299/512]
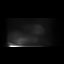
[frame 384/512]
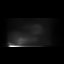
[frame 470/512]
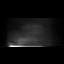

[Series 1: rest · 6.40mm/px · 5 of 63 frames shown]
[frame 6/63]
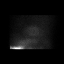
[frame 16/63]
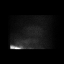
[frame 37/63]
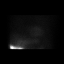
[frame 48/63]
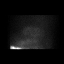
[frame 58/63]
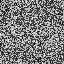

[Series 1: wbr_s-proj_st stress-sum-em-mc · 6.40mm/px · 6 of 64 frames shown]
[frame 6/64]
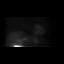
[frame 16/64]
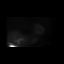
[frame 27/64]
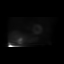
[frame 38/64]
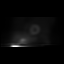
[frame 48/64]
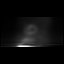
[frame 59/64]
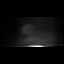

[Series 1: stress-gsp-mc · 6.40mm/px · 5 of 512 frames shown]
[frame 43/512]
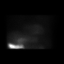
[frame 214/512]
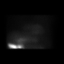
[frame 299/512]
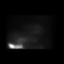
[frame 384/512]
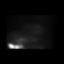
[frame 470/512]
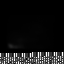

[Series 1: wbr_r-proj_st rest-mc · 6.40mm/px · 6 of 64 frames shown]
[frame 6/64]
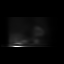
[frame 16/64]
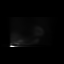
[frame 27/64]
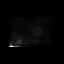
[frame 38/64]
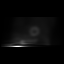
[frame 48/64]
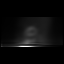
[frame 59/64]
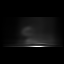

[Series 1: wbr_s-proj_st stress-gsp-mc · 6.40mm/px · 5 of 512 frames shown]
[frame 128/512]
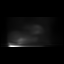
[frame 214/512]
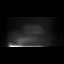
[frame 299/512]
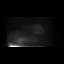
[frame 384/512]
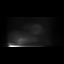
[frame 470/512]
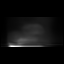

[60 of 60 positions shown; findings below may reference images not displayed]

Canned report from images found in remote index.

Refer to host system for actual result text.

## 2019-01-23 ENCOUNTER — Ambulatory Visit: Payer: Medicare Other | Admitting: Cardiology

## 2019-01-24 ENCOUNTER — Encounter: Payer: Self-pay | Admitting: Cardiology

## 2019-01-24 ENCOUNTER — Ambulatory Visit (INDEPENDENT_AMBULATORY_CARE_PROVIDER_SITE_OTHER): Payer: Medicare Other | Admitting: Cardiology

## 2019-01-24 ENCOUNTER — Other Ambulatory Visit: Payer: Self-pay

## 2019-01-24 VITALS — BP 142/70 | HR 98 | Temp 98.7°F | Ht 70.0 in | Wt 155.0 lb

## 2019-01-24 DIAGNOSIS — I714 Abdominal aortic aneurysm, without rupture, unspecified: Secondary | ICD-10-CM

## 2019-01-24 DIAGNOSIS — I2510 Atherosclerotic heart disease of native coronary artery without angina pectoris: Secondary | ICD-10-CM

## 2019-01-24 DIAGNOSIS — I6523 Occlusion and stenosis of bilateral carotid arteries: Secondary | ICD-10-CM | POA: Diagnosis not present

## 2019-01-24 NOTE — Progress Notes (Signed)
Follow up visit  Subjective:   Justin [name], male    DOB: 1923-12-08, 85 y.o.   MRN: [id number]   Chief Complaint  Patient presents with  . Corneal Ulcer  . Follow-up    6 month    HPI  85 y/o male with severe multivessel CAD, NSTEMI in 11/2017, PAD (b/l mod carotid stenosis), s/p endovascular repair of 5.5 cm infrarenal abdominal aorta aneurysm 06/03/2016, hypertension, h/o postop Afib in 2013 with no reported recurrence.  He was deemed not to be a surgical candidate. Patient has opted against interventional management for his severe CAD. Patient has done well in spite of his comborbidities. He is here today with his daughter-in-law. He admits that he has "good days and bad days', but says, "heis doing well for a 85 year old". In last 6 months, he has had only one episode where he had to take nitroglycerin for chest pain.  AAA s/p repair, showed largest diamater of the sac size is 5.09 x 4.46 in 07/2018, which is relatively stable.    Past Medical History:  Diagnosis Date  . AAA (abdominal aortic aneurysm) ([place])    "we've known about it since ~ 2000"  . Arthritis    in back and hands:  Gout  . Atrial fibrillation ([place])    a. 04/2011 post op  . BPH (benign prostatic hyperplasia)   . Bronchitis    hx of; "once or twice in my life" (04/21/11)  . Cancer ([place])    Basal Cell carcinoma- per Dr Jacquiline Doe office notes  . Carotid artery disease ([place])   . Chronic kidney disease    stage 3 - per Dr Jacquiline Doe office notes  . Chronic lower back pain 1995  . Eczema   . GERD (gastroesophageal reflux disease)   . Gout of big toe    "h/o in both"  . Hiatal hernia   . History of kidney stones   . Hypertension   . Lumbar spondylosis   . Lumbar stenosis    a. s/p  anterolateral decompression and lateral plating 04/21/2011  . Migraine    "left ~ 1980's"  . Stomach ulcer    "years ago; treated w/diet"      Past Surgical History:  Procedure Laterality Date  . ABDOMINAL AORTA STENT   06/03/2016   ABDOMINAL AORTIC ENDOVASCULAR STENT GRAFT insertion (N/A)  . ABDOMINAL AORTIC ENDOVASCULAR STENT GRAFT N/A 06/03/2016   Procedure: ABDOMINAL AORTIC ENDOVASCULAR STENT GRAFT insertion;  Surgeon: Serafina Mitchell, MD;  Location: [facility];  Service: Vascular;  Laterality: N/A;  . ANTERIOR LAT LUMBAR FUSION  04/21/11   w/PEEK cage, allograft, and lateral plate (r)  . ANTERIOR LAT LUMBAR FUSION  04/21/2011   Procedure: ANTERIOR LATERAL LUMBAR FUSION 1 LEVEL;  Surgeon: Erline Levine, MD;  Location: [facility];  Service: Neurosurgery;  Laterality: Right;  RIGHT Lumbar three-four anterolateral decompression with fusion and lateral plating  . APPENDECTOMY  1946  . BACK SURGERY  04/21/11   "today makes the 5th time"  . CATARACT EXTRACTION, BILATERAL    . EYE SURGERY Bilateral    Cataract  . LEFT HEART CATH AND CORONARY ANGIOGRAPHY N/A 11/22/2017   Procedure: LEFT HEART CATH AND CORONARY ANGIOGRAPHY;  Surgeon: Nigel Mormon, MD;  Location: [facility];  Service: Cardiovascular;  Laterality: N/A;  . PROSTATE SURGERY    . SPINE SURGERY     X's 5   . TONSILLECTOMY     "as a child" and  Adneoids     Social History   Socioeconomic History  . Marital status: Married    Spouse name: Not on file  . Number of children: 2  . Years of education: Not on file  . Highest education level: Not on file  Occupational History  . Not on file  Tobacco Use  . Smoking status: Former Smoker    Packs/day: 1.00    Years: 20.00    Pack years: 20.00    Types: Cigarettes    Quit date: 02/10/1980    Years since quitting: 38.9  . Smokeless tobacco: Never Used  Substance and Sexual Activity  . Alcohol use: No  . Drug use: No  . Sexual activity: Yes  Other Topics Concern  . Not on file  Social History Narrative   Lives in [place] his wife.  Retired.  Activity limited by back pain.   Social Determinants of Health   Financial Resource Strain:   . Difficulty of Paying Living  Expenses: Not on file  Food Insecurity:   . Worried About Charity fundraiser in the Last Year: Not on file  . Ran Out of Food in the Last Year: Not on file  Transportation Needs:   . Lack of Transportation (Medical): Not on file  . Lack of Transportation (Non-Medical): Not on file  Physical Activity:   . Days of Exercise per Week: Not on file  . Minutes of Exercise per Session: Not on file  Stress:   . Feeling of Stress : Not on file  Social Connections:   . Frequency of Communication with Friends and Family: Not on file  . Frequency of Social Gatherings with Friends and Family: Not on file  . Attends Religious Services: Not on file  . Active Member of Clubs or Organizations: Not on file  . Attends Archivist Meetings: Not on file  . Marital Status: Not on file  Intimate Partner Violence:   . Fear of Current or Ex-Partner: Not on file  . Emotionally Abused: Not on file  . Physically Abused: Not on file  . Sexually Abused: Not on file     Family History  Problem Relation Age of Onset  . Alzheimer's disease Mother        died @ 42  . COPD Father        died @ 21  . Cancer Father   . Hypertension Father   . Heart disease Father   . Stroke Sister   . Cancer Brother        stomach  . Heart disease Brother        Aneurysm  . Hypertension Son   . Heart disease Son        Aneurysm  . Hypertension Son   . Heart disease Son        Aneurysm  . Anesthesia problems Neg Hx   . Hypotension Neg Hx   . Malignant hyperthermia Neg Hx   . Pseudochol deficiency Neg Hx      Current Outpatient Medications on File Prior to Visit  Medication Sig Dispense Refill  . acetaminophen (TYLENOL) 500 MG tablet Take 1,000 mg by mouth daily as needed (back pain).    Marland Kitchen aspirin EC 81 MG tablet Take 81 mg by mouth daily.     . brimonidine (ALPHAGAN) 0.2 % ophthalmic solution INSTILL 1 DROP INTO BOTH EYES 2 TIMES DAILY    . clopidogrel (PLAVIX) 75 MG tablet Take 1 tablet (75 mg  total)  by mouth daily. 90 tablet 3  . COLCRYS 0.6 MG tablet Take 0.6 mg by mouth daily.    Marland Kitchen donepezil (ARICEPT) 5 MG tablet Take 5 mg by mouth daily.  4  . dorzolamide-timolol (COSOPT) 22.3-6.8 MG/ML ophthalmic solution Place 1 drop into both eyes 2 times daily.    Marland Kitchen dutasteride (AVODART) 0.5 MG capsule Take 0.5 mg by mouth. W,F    . Ferrous Sulfate (IRON) 325 (65 Fe) MG TABS TAKE 1 TABLET BY MOUTH 2 TIMES DAILY 180 tablet 1  . furosemide (LASIX) 20 MG tablet Take 20 mg by mouth. M,W,F    . latanoprost (XALATAN) 0.005 % ophthalmic solution     . lisinopril (ZESTRIL) 20 MG tablet Take 1 tablet (20 mg total) by mouth daily. 90 tablet 3  . metoprolol succinate (TOPROL-XL) 25 MG 24 hr tablet TAKE 1 TABLET BY MOUTH EVERY DAY 90 tablet 1  . Multiple Vitamin (MULTIVITAMIN) capsule Take 1 capsule by mouth daily.    . Multiple Vitamins-Minerals (SYSTANE ICAPS AREDS2) TABS     . nitroGLYCERIN (NITROSTAT) 0.4 MG SL tablet Place 1 tablet (0.4 mg total) under the tongue every 5 (five) minutes x 3 doses as needed for chest pain. 30 tablet 3  . potassium chloride (K-DUR) 10 MEQ tablet M,W,F    . ranolazine (RANEXA) 500 MG 12 hr tablet TAKE 1 TABLET BY MOUTH 2 TIMES DAILY 180 tablet 0  . rosuvastatin (CRESTOR) 10 MG tablet TAKE 1 TABLET BY MOUTH DAILY AT 6 PM 90 tablet 3  . Travoprost, BAK Free, (TRAVATAN Z) 0.004 % SOLN ophthalmic solution Place 1 drop into the left eye daily.    . vitamin C (ASCORBIC ACID) 500 MG tablet      No current facility-administered medications on file prior to visit.    Cardiovascular studies:  EKG 01/24/2019: Sinus rhythm 86 bpm.  Diffuse ST depression, ST elevation in aVR, s/o ischemia.  Aorta duplex 07/2018: Abdominal Aorta: Previous diameter measurement was 5.4 cm obtained on 07/20/2017. The largest diamater of the sac size is 5.09 x 4.46.    EKG 07/21/2018: Sinus rhythm 79 bpm.  Left axis deviation Left atrial enlargement.  Nonspecific ST depression  Coronary  angiography 11/22/2017: LM: Distal LM bifurcation has severely calcified 95% stenosis LAD: Large diag 1 with ostial 95% stenosis          LAD/Diag 2 bifurcation 90% stenosis. Mid diag 2 60% diserase          Rest of the LAD looks fairly normal with good surgical targets on LAD as well as diag vessels LCx: Prox LCx tortuous with 70% disease          Moderate sized OM 1 with long 95% stenosis with good surgical targets  RCA: Prox-mid RCA 50% irregular disease  Normal LVEDP  Severely multivessel CAD  Echocardiogram 11/21/2017: - Left ventricle: The cavity size was normal. There was moderate   concentric hypertrophy. Systolic function was normal. The   estimated ejection fraction was in the range of 55% to 60%. Miskell   motion was normal; there were no regional Kahre motion   abnormalities. Doppler parameters are consistent with abnormal   left ventricular relaxation (grade 1 diastolic dysfunction). - Aortic valve: Moderately calcified annulus. Trileaflet; mildly   calcified leaflets. There was no significant regurgitation.   Inadequate Doppler evaluation to assess stenosis. Visually, does   not appear significant. - Mitral valve: The findings are consistent with mild calcific   stenosis. mean PG  4 mmHg, MVA 1.8 cm2 at HR 74 bpm. Valve area by   pressure half-time: 1.86 cm^2. - Tricuspid valve: There was mild regurgitation. - Pulmonary arteries: PA peak pressure: 29 mm Hg (S).  Recent labs: Results for PRABHJOT, PISCITELLO (MRN [id number]) as of 07/21/2018 20:57  Ref. Range 03/15/2018 09:38  BASIC METABOLIC PANEL Unknown Rpt (A)  Sodium Latest Ref Range: 135 - 145 mmol/L 144  Potassium Latest Ref Range: 3.5 - 5.1 mmol/L 4.0  Chloride Latest Ref Range: 98 - 111 mmol/L 111  CO2 Latest Ref Range: 22 - 32 mmol/L 22  Glucose Latest Ref Range: 70 - 99 mg/dL 153 (H)  BUN Latest Ref Range: 8 - 23 mg/dL 20  Creatinine Latest Ref Range: 0.61 - 1.24 mg/dL 1.26 (H)  Calcium Latest Ref Range: 8.9 -  10.3 mg/dL 9.6  Anion gap Latest Ref Range: 5 - 15  11  GFR, Est Non African American Latest Ref Range: >60 mL/min 49 (L)  GFR, Est African American Latest Ref Range: >60 mL/min 57 (L)   Results for YENG, PERZ (MRN [id number]) as of 07/21/2018 20:57  Ref. Range 03/15/2018 11:52  WBC Latest Ref Range: 4.0 - 10.5 K/uL 5.3  RBC Latest Ref Range: 4.22 - 5.81 MIL/uL 4.41  Hemoglobin Latest Ref Range: 13.0 - 17.0 g/dL 13.4  HCT Latest Ref Range: 39.0 - 52.0 % 43.2  MCV Latest Ref Range: 80.0 - 100.0 fL 98.0  MCH Latest Ref Range: 26.0 - 34.0 pg 30.4  MCHC Latest Ref Range: 30.0 - 36.0 g/dL 31.0  RDW Latest Ref Range: 11.5 - 15.5 % 13.4  Platelets Latest Ref Range: 150 - 400 K/uL 175  nRBC Latest Ref Range: 0.0 - 0.2 % 0.0    Review of Systems  Constitution: Negative for decreased appetite, malaise/fatigue, weight gain and weight loss.  HENT: Negative for congestion.   Eyes: Negative for visual disturbance.  Cardiovascular: Negative for chest pain, dyspnea on exertion, leg swelling, palpitations and syncope.  Respiratory: Negative for cough.   Endocrine: Negative for cold intolerance.  Hematologic/Lymphatic: Does not bruise/bleed easily.  Skin: Negative for itching and rash.  Musculoskeletal: Negative for myalgias.  Gastrointestinal: Negative for abdominal pain, nausea and vomiting.  Genitourinary: Negative for dysuria.  Neurological: Negative for dizziness and weakness.  Psychiatric/Behavioral: The patient is not nervous/anxious.   All other systems reviewed and are negative.        Vitals:   01/24/19 1333 01/24/19 1358  BP: (!) 153/78 (!) 142/70  Pulse: 87 98  Temp: 98.7 F (37.1 C)   SpO2: 100%     Body mass index is 22.24 kg/m. Filed Weights   01/24/19 1333  Weight: 155 lb (70.3 kg)     Objective:   Physical Exam  Constitutional: He is oriented to person, place, and time. He appears well-developed and well-nourished. No distress.  HENT:  Head: Normocephalic  and atraumatic.  Eyes: Pupils are equal, round, and reactive to light. Conjunctivae are normal.  Neck: No JVD present.  Cardiovascular: Normal rate and regular rhythm. Exam reveals decreased pulses.  No murmur heard. Pulses:      Carotid pulses are on the right side with bruit and on the left side with bruit. Pulmonary/Chest: Effort normal and breath sounds normal. He has no wheezes. He has no rales.  Abdominal: Soft. Bowel sounds are normal. There is no rebound.  Musculoskeletal:        General: No edema.  Lymphadenopathy:    He has no  cervical adenopathy.  Neurological: He is alert and oriented to person, place, and time. No cranial nerve deficit.  Skin: Skin is warm and dry.  Psychiatric: He has a normal mood and affect.  Nursing note and vitals reviewed.         Assessment & Recommendations:   85 y/o male with severe multivessel CAD, NSTEMI in 11/2017, PAD (b/l mod carotid stenosis), s/p endovascular repair of 5.5 cm infrarenal abdominal aorta aneurysm 06/03/2016, hypertension, h/o postop Afib in 2013 with no reported recurrence.  Coronary artery disease: No angina symptoms, although EKG today shows diffuse ischemia.. Continue medical management of coronary artery disease including dual antiplatelet therapy with aspirin and Plavix, metoprolol succinate 25 mg daily, lisinopril 20 mg daily, Ranexa 500 mg twice daily, as needed sublingual nitroglycerin. Continue rosuvastatin 10 mg daily. Patient and family have chosen to be DNR and does not want aggressive invasive management. I respect their wishes.   Hypertension: Fairly well controlled. No change made today.  PAD: Continue medical therapy. Both feet are cool touch but have good capillary refill. Feeble distal pulses.   S/p endovascular repair of AAA: Stable.  CKD 3: Stable   Follow up in 6 months.    Nigel Mormon, MD [facility] Pager: [phone] Office: [phone] If no answer Cell  [phone]

## 2019-02-14 ENCOUNTER — Other Ambulatory Visit: Payer: Self-pay

## 2019-02-14 MED ORDER — NITROGLYCERIN 0.4 MG SL SUBL: 0.4000 mg | SUBLINGUAL_TABLET | SUBLINGUAL | 3 refills | Status: DC | PRN

## 2019-02-17 ENCOUNTER — Telehealth (HOSPITAL_COMMUNITY): Payer: Self-pay

## 2019-02-17 NOTE — Telephone Encounter (Signed)

## 2019-02-20 ENCOUNTER — Ambulatory Visit (HOSPITAL_COMMUNITY): Admission: RE | Admit: 2019-02-20 | Payer: Medicare Other | Source: Ambulatory Visit

## 2019-02-20 ENCOUNTER — Other Ambulatory Visit: Payer: Self-pay | Admitting: Cardiology

## 2019-02-20 ENCOUNTER — Ambulatory Visit: Payer: Medicare Other

## 2019-02-27 ENCOUNTER — Ambulatory Visit: Payer: Medicare Other

## 2019-02-27 ENCOUNTER — Encounter (HOSPITAL_COMMUNITY): Payer: Medicare Other

## 2019-02-27 ENCOUNTER — Other Ambulatory Visit: Payer: Self-pay

## 2019-02-27 DIAGNOSIS — I6523 Occlusion and stenosis of bilateral carotid arteries: Secondary | ICD-10-CM

## 2019-02-27 NOTE — Progress Notes (Signed)
HISTORY AND PHYSICAL     CC:  follow up. Requesting Provider:  Burnard Bunting, MD  I connected with Mr. Rabinovich on 02/28/2019 using the [email or web address] by telephone and I was speaking to his daughter in law. Patient was located at home and accompanied by son. I am located at [facility] office.   The limitations of evaluation and management by telemedicine and the availability of in person appointments have been previously discussed with the patient and are documented in the patients chart. The patient expressed understanding and consented to proceed.   HPI: This is a 86 y.o. male here for follow up for carotid artery stenosis.    Pt was last seen 07/14/2018.  At that time, he was doing well with 60-79% bilateral carotid artery stenosis & was asymptomatic.  Family was concerned about his worsening dementia.    Daughter in law states that pt has not had any weakness or numbness, hemiplegia, speech difficulties, amaurosis fugax or facial droop.    She states that he has required some NTG at night for tightness/heaviness in his chest and required 2 NTG on Sunday.  She states that they discussed this with Dr. Virgina Jock in December so he is aware.  She states that he sleeps well.  He wants to get out and go everyday but is exhausted going from the car to the porch and just weak in general.    He is status post endovascular repair of a 5.5 cm infrarenal abdominal aortic aneurysm on 06/03/2016. His postoperative course was uncomplicated.  He did get his first covid vaccination today.    The pt is on a statin for cholesterol management.  The pt is on a daily aspirin.   Other AC:  none The pt is on BB and ACEI for hypertension.   The pt is not diabetic.   Tobacco hx:  remote   Past Medical History:  Diagnosis Date  . AAA (abdominal aortic aneurysm) ([place])    "we've known about it since ~ 2000"  . Arthritis    in back and hands:  Gout  . Atrial fibrillation ([place])    a. 04/2011 post op  . BPH (benign  prostatic hyperplasia)   . Bronchitis    hx of; "once or twice in my life" (04/21/11)  . Cancer ([place])    Basal Cell carcinoma- per Dr Jacquiline Doe office notes  . Carotid artery disease ([place])   . Chronic kidney disease    stage 3 - per Dr Jacquiline Doe office notes  . Chronic lower back pain 1995  . Eczema   . GERD (gastroesophageal reflux disease)   . Gout of big toe    "h/o in both"  . Hiatal hernia   . History of kidney stones   . Hypertension   . Lumbar spondylosis   . Lumbar stenosis    a. s/p  anterolateral decompression and lateral plating 04/21/2011  . Migraine    "left ~ 1980's"  . Stomach ulcer    "years ago; treated w/diet"     Past Surgical History:  Procedure Laterality Date  . ABDOMINAL AORTA STENT  06/03/2016   ABDOMINAL AORTIC ENDOVASCULAR STENT GRAFT insertion (N/A)  . ABDOMINAL AORTIC ENDOVASCULAR STENT GRAFT N/A 06/03/2016   Procedure: ABDOMINAL AORTIC ENDOVASCULAR STENT GRAFT insertion;  Surgeon: Serafina Mitchell, MD;  Location: [facility];  Service: Vascular;  Laterality: N/A;  . ANTERIOR LAT LUMBAR FUSION  04/21/11   w/PEEK cage, allograft, and lateral plate (r)  . ANTERIOR LAT LUMBAR FUSION  04/21/2011   Procedure: ANTERIOR LATERAL LUMBAR FUSION 1 LEVEL;  Surgeon: Erline Levine, MD;  Location: [facility];  Service: Neurosurgery;  Laterality: Right;  RIGHT Lumbar three-four anterolateral decompression with fusion and lateral plating  . APPENDECTOMY  1946  . BACK SURGERY  04/21/11   "today makes the 5th time"  . CATARACT EXTRACTION, BILATERAL    . EYE SURGERY Bilateral    Cataract  . LEFT HEART CATH AND CORONARY ANGIOGRAPHY N/A 11/22/2017   Procedure: LEFT HEART CATH AND CORONARY ANGIOGRAPHY;  Surgeon: Nigel Mormon, MD;  Location: [facility];  Service: Cardiovascular;  Laterality: N/A;  . PROSTATE SURGERY    . SPINE SURGERY     X's 5   . TONSILLECTOMY     "as a child" and Adneoids    Allergies  Allergen Reactions  . Demerol Anaphylaxis     Current Outpatient Medications  Medication Sig Dispense Refill  . acetaminophen (TYLENOL) 500 MG tablet Take 1,000 mg by mouth daily as needed (back pain).    Marland Kitchen aspirin EC 81 MG tablet Take 81 mg by mouth daily.     . brimonidine (ALPHAGAN) 0.2 % ophthalmic solution INSTILL 1 DROP INTO BOTH EYES 2 TIMES DAILY    . clopidogrel (PLAVIX) 75 MG tablet Take 1 tablet (75 mg total) by mouth daily. 90 tablet 3  . COLCRYS 0.6 MG tablet Take 0.6 mg by mouth daily.    Marland Kitchen donepezil (ARICEPT) 5 MG tablet Take 5 mg by mouth daily.  4  . dorzolamide-timolol (COSOPT) 22.3-6.8 MG/ML ophthalmic solution Place 1 drop into both eyes 2 times daily.    Marland Kitchen dutasteride (AVODART) 0.5 MG capsule Take 0.5 mg by mouth. W,F    . Ferrous Sulfate (IRON) 325 (65 Fe) MG TABS TAKE 1 TABLET BY MOUTH 2 TIMES DAILY 180 tablet 1  . furosemide (LASIX) 20 MG tablet Take 20 mg by mouth. M,W,F    . latanoprost (XALATAN) 0.005 % ophthalmic solution     . lisinopril (ZESTRIL) 20 MG tablet Take 1 tablet (20 mg total) by mouth daily. 90 tablet 3  . metoprolol succinate (TOPROL-XL) 25 MG 24 hr tablet TAKE 1 TABLET BY MOUTH EVERY DAY 90 tablet 1  . Multiple Vitamin (MULTIVITAMIN) capsule Take 1 capsule by mouth daily.    . Multiple Vitamins-Minerals (SYSTANE ICAPS AREDS2) TABS     . nitroGLYCERIN (NITROSTAT) 0.4 MG SL tablet PLACE ONE TABLET UNDER TONGUE AS NEEDED FOR CHEST PAIN EVERY 5 MINUTES 25 tablet 3  . potassium chloride (K-DUR) 10 MEQ tablet M,W,F    . ranolazine (RANEXA) 500 MG 12 hr tablet TAKE 1 TABLET BY MOUTH 2 TIMES DAILY 180 tablet 0  . rosuvastatin (CRESTOR) 10 MG tablet TAKE 1 TABLET BY MOUTH DAILY AT 6 PM 90 tablet 3  . Travoprost, BAK Free, (TRAVATAN Z) 0.004 % SOLN ophthalmic solution Place 1 drop into the left eye daily.    . vitamin C (ASCORBIC ACID) 500 MG tablet      No current facility-administered medications for this visit.    Family History  Problem Relation Age of Onset  . Alzheimer's disease Mother         died @ 85  . COPD Father        died @ 64  . Cancer Father   . Hypertension Father   . Heart disease Father   . Stroke Sister   . Cancer Brother        stomach  .  Heart disease Brother        Aneurysm  . Hypertension Son   . Heart disease Son        Aneurysm  . Hypertension Son   . Heart disease Son        Aneurysm  . Anesthesia problems Neg Hx   . Hypotension Neg Hx   . Malignant hyperthermia Neg Hx   . Pseudochol deficiency Neg Hx     Social History   Socioeconomic History  . Marital status: Married    Spouse name: Not on file  . Number of children: 2  . Years of education: Not on file  . Highest education level: Not on file  Occupational History  . Not on file  Tobacco Use  . Smoking status: Former Smoker    Packs/day: 1.00    Years: 20.00    Pack years: 20.00    Types: Cigarettes    Quit date: 02/10/1980    Years since quitting: 39.0  . Smokeless tobacco: Never Used  Substance and Sexual Activity  . Alcohol use: No  . Drug use: No  . Sexual activity: Yes  Other Topics Concern  . Not on file  Social History Narrative   Lives in [place] wife.  Retired.  Activity limited by back pain.   Social Determinants of Health   Financial Resource Strain:   . Difficulty of Paying Living Expenses: Not on file  Food Insecurity:   . Worried About Charity fundraiser in the Last Year: Not on file  . Ran Out of Food in the Last Year: Not on file  Transportation Needs:   . Lack of Transportation (Medical): Not on file  . Lack of Transportation (Non-Medical): Not on file  Physical Activity:   . Days of Exercise per Week: Not on file  . Minutes of Exercise per Session: Not on file  Stress:   . Feeling of Stress : Not on file  Social Connections:   . Frequency of Communication with Friends and Family: Not on file  . Frequency of Social Gatherings with Friends and Family: Not on file  . Attends Religious Services: Not on file  . Active Member of  Clubs or Organizations: Not on file  . Attends Archivist Meetings: Not on file  . Marital Status: Not on file  Intimate Partner Violence:   . Fear of Current or Ex-Partner: Not on file  . Emotionally Abused: Not on file  . Physically Abused: Not on file  . Sexually Abused: Not on file     REVIEW OF SYSTEMS:  12 system ROS was negative unless otherwise noted in HPI  Observation/Objective:  Non-Invasive Vascular Imaging:   Carotid Duplex on 02/28/2019: Right:  60-79% ICA stenosis Left:  60-79% ICA stenosis Vertebrals:  Bilateral vertebral arteries demonstrate antegrade flow. Subclavians: Normal flow hemodynamics were seen in bilateral subclavian arteries.  Previous Carotid duplex on 07/14/2018: Right: 60-79% ICA stenosis Left:   60/-79% ICA stenosis Vertebrals:  Bilateral vertebral arteries demonstrate antegrade flow. Subclavians: Normal flow hemodynamics were seen in bilateral subclavian arteries.  EVAR duplex 07/14/2018: Final interpretation:: Abdominal Aorta: Previous diameter measurement was 5.4 cm obtained on 07/20/2017. The largest diamater of the sac size is 5.09 x 4.46.   ASSESSMENT/PLAN:: 86 y.o. male here for follow up carotid artery stenosis.  -discussed findings with daughter in law.  Findings are essentially unchanged with 60-79% bilateral carotid artery stenosis.  -discussed s/s of stroke with pt and they understand  should they develop any of these sx, they will go to the nearest ER. -He will have u/s of his endograft for AAA in 6 months as well as repeat carotid duplex.  They will call sooner should they need [place] sooner.   -she did have question/concern about the covid vaccine and I advised her to discuss further with his primary care physician.     Leontine Locket, PA-C [facility] [phone]  Clinic MD:  Trula Slade

## 2019-02-28 ENCOUNTER — Other Ambulatory Visit: Payer: Self-pay

## 2019-02-28 ENCOUNTER — Ambulatory Visit (HOSPITAL_COMMUNITY)
Admission: RE | Admit: 2019-02-28 | Discharge: 2019-02-28 | Disposition: A | Discharge status: Home / Self Care | Payer: Medicare Other | Source: Ambulatory Visit | Attending: Surgery | Admitting: Surgery

## 2019-02-28 ENCOUNTER — Ambulatory Visit (INDEPENDENT_AMBULATORY_CARE_PROVIDER_SITE_OTHER): Payer: Medicare Other | Admitting: Physician Assistant

## 2019-02-28 DIAGNOSIS — I6523 Occlusion and stenosis of bilateral carotid arteries: Secondary | ICD-10-CM

## 2019-03-02 ENCOUNTER — Other Ambulatory Visit: Payer: Self-pay | Admitting: *Deleted

## 2019-03-02 DIAGNOSIS — Z95828 Presence of other vascular implants and grafts: Secondary | ICD-10-CM

## 2019-03-02 DIAGNOSIS — I6523 Occlusion and stenosis of bilateral carotid arteries: Secondary | ICD-10-CM

## 2019-03-24 ENCOUNTER — Inpatient Hospital Stay (HOSPITAL_COMMUNITY): Admission: RE | Admit: 2019-03-24 | Payer: Medicare Other | Source: Ambulatory Visit

## 2019-03-24 ENCOUNTER — Ambulatory Visit: Payer: Medicare Other

## 2019-03-27 ENCOUNTER — Telehealth: Payer: Self-pay

## 2019-03-27 ENCOUNTER — Ambulatory Visit: Payer: Medicare Other

## 2019-03-27 ENCOUNTER — Encounter (HOSPITAL_COMMUNITY): Payer: Medicare Other

## 2019-03-27 DIAGNOSIS — R0602 Shortness of breath: Secondary | ICD-10-CM | POA: Insufficient documentation

## 2019-03-27 MED ORDER — FUROSEMIDE 20 MG PO TABS: 20.0000 mg | ORAL_TABLET | Freq: Every day | ORAL | 1 refills | Status: DC

## 2019-03-27 NOTE — Telephone Encounter (Signed)
86 y/o male with severe multivessel CAD, NSTEMI in 11/2017, PAD (b/l mod carotid stenosis), s/p endovascular repair of 5.5 cm infrarenal abdominal aorta aneurysm 06/03/2016, hypertension, h/o postop Afib in 2013 with no reported recurrence.  Patient daughter-in-law Sharee Pimple contacted today, stating the patient has had worsening shortness of breath and cough for last few days.  He also had 2 episodes of chest pain that relieved with nitroglycerin.  Patient has wishes not to be resuscitated and would like to avoid hospitalization and/or invasive symptom management.  We both mutually agreed on pursuing conservative management at home as much as possible.  I recommended adding furosemide 20 mg once daily, in addition to 20 g every other day that patient is currently taking.  Patient may use nitroglycerin for chest pain.  If he is requiring multiple nitroglycerin, I will then add a long-acting nitrate.  Nigel Mormon, MD

## 2019-03-27 NOTE — Telephone Encounter (Signed)
S/w pt's daughter in law, pt is feeling better and will let us know if he is not feeling good.

## 2019-04-04 ENCOUNTER — Other Ambulatory Visit: Payer: Self-pay

## 2019-04-04 ENCOUNTER — Encounter: Payer: Self-pay | Admitting: Podiatry

## 2019-04-04 ENCOUNTER — Ambulatory Visit (INDEPENDENT_AMBULATORY_CARE_PROVIDER_SITE_OTHER): Payer: Medicare Other | Admitting: Podiatry

## 2019-04-04 DIAGNOSIS — M79675 Pain in left toe(s): Secondary | ICD-10-CM | POA: Diagnosis not present

## 2019-04-04 DIAGNOSIS — B351 Tinea unguium: Secondary | ICD-10-CM

## 2019-04-04 DIAGNOSIS — M79674 Pain in right toe(s): Secondary | ICD-10-CM

## 2019-04-04 DIAGNOSIS — R234 Changes in skin texture: Secondary | ICD-10-CM

## 2019-04-04 NOTE — Patient Instructions (Addendum)
Moisturize feet daily with moisturizing lotion followed by Aquaphor Ointment or Vaseline Petroleum Jelly for dry, cracked heels.   Onychomycosis/Fungal Toenails  WHAT IS IT? An infection that lies within the keratin of your nail plate that is caused by a fungus.  WHY ME? Fungal infections affect all ages, sexes, races, and creeds.  There may be many factors that predispose you to a fungal infection such as age, coexisting medical conditions such as diabetes, or an autoimmune disease; stress, medications, fatigue, genetics, etc.  Bottom line: fungus thrives in a warm, moist environment and your shoes offer such a location.  IS IT CONTAGIOUS? Theoretically, yes.  You do not want to share shoes, nail clippers or files with someone who has fungal toenails.  Walking around barefoot in the same room or sleeping in the same bed is unlikely to transfer the organism.  It is important to realize, however, that fungus can spread easily from one nail to the next on the same foot.  HOW DO WE TREAT THIS?  There are several ways to treat this condition.  Treatment may depend on many factors such as age, medications, pregnancy, liver and kidney conditions, etc.  It is best to ask your doctor which options are available to you.  1. No treatment.   Unlike many other medical concerns, you can live with this condition.  However for many people this can be a painful condition and may lead to ingrown toenails or a bacterial infection.  It is recommended that you keep the nails cut short to help reduce the amount of fungal nail. 2. Topical treatment.  These range from herbal remedies to prescription strength nail lacquers.  About 40-50% effective, topicals require twice daily application for approximately 9 to 12 months or until an entirely new nail has grown out.  The most effective topicals are medical grade medications available through physicians offices. 3. Oral antifungal medications.  With an 80-90% cure rate, the  most common oral medication requires 3 to 4 months of therapy and stays in your system for a year as the new nail grows out.  Oral antifungal medications do require blood work to make sure it is a safe drug for you.  A liver function panel will be performed prior to starting the medication and after the first month of treatment.  It is important to have the blood work performed to avoid any harmful side effects.  In general, this medication safe but blood work is required. 4. Laser Therapy.  This treatment is performed by applying a specialized laser to the affected nail plate.  This therapy is noninvasive, fast, and non-painful.  It is not covered by insurance and is therefore, out of pocket.  The results have been very good with a 80-95% cure rate.  The [facility] is the only practice in the area to offer this therapy. 5. Permanent Nail Avulsion.  Removing the entire nail so that a new nail will not grow back.

## 2019-04-05 ENCOUNTER — Other Ambulatory Visit: Payer: Self-pay | Admitting: Cardiology

## 2019-04-06 NOTE — Progress Notes (Signed)
Subjective: Justin [name] presents today for follow up of painful mycotic nails b/l that are difficult to trim. Pain interferes with ambulation. Aggravating factors include wearing enclosed shoe gear. Pain is relieved with periodic professional debridement.   Allergies  Allergen Reactions  . Demerol Anaphylaxis     Objective: There were no vitals filed for this visit.  Vascular Examination:  Capillary fill time to digits <3s b/l, faintly palpable DP pulses b/l, non-palpable PT pulses b/l, pedal hair absent b/l and skin temperature gradient within normal limits b/l  Dermatological Examination: Pedal skin is thin shiny, atrophic bilaterally, no open wounds bilaterally, toenails 1-5 b/l elongated, dystrophic, thickened, crumbly with subungual debris and Fissuring of skin noted b/l feet. Fissuring is longitudinal and located circumference of b/l heels. No erythema, no edema, no drainage, no flocculence.  Musculoskeletal: Normal muscle strength 5/5 to all lower extremity muscle groups bilaterally, no gross bony deformities bilaterally and no pain crepitus or joint limitation noted with ROM b/l  Neurological: Protective sensation intact 5/5 intact bilaterally with 10g monofilament b/l  Assessment: 1. Pain due to onychomycosis of toenails of both feet   2. Fissure in skin of both feet    Plan: -Toenails 1-5 b/l were debrided in length and girth with sterile nail nippers and dremel without iatrogenic bleeding. -For heel fissures, wife was instructed to moisturized both feet with moisturizing lotion followed by Aquaphor Ointment or Vaseline Petroleum Jelly. She related understanding. -Patient to continue soft, supportive shoe gear daily. -Patient to report any pedal injuries to medical professional immediately. -Patient/POA to call should there be question/concern in the interim.  Return in about 3 months (around 07/02/2019) for nail trim/ Plavix.

## 2019-04-26 ENCOUNTER — Other Ambulatory Visit: Payer: Self-pay | Admitting: Cardiology

## 2019-04-26 DIAGNOSIS — R0602 Shortness of breath: Secondary | ICD-10-CM

## 2019-05-11 ENCOUNTER — Other Ambulatory Visit: Payer: Self-pay | Admitting: Cardiology

## 2019-05-17 ENCOUNTER — Other Ambulatory Visit: Payer: Self-pay

## 2019-05-17 ENCOUNTER — Emergency Department (HOSPITAL_COMMUNITY): Payer: Medicare Other

## 2019-05-17 ENCOUNTER — Inpatient Hospital Stay (HOSPITAL_COMMUNITY): Payer: Medicare Other

## 2019-05-17 ENCOUNTER — Inpatient Hospital Stay (HOSPITAL_COMMUNITY)
Admission: EM | Admit: 2019-05-17 | Discharge: 2019-06-10 | DRG: 682 | Disposition: E | Payer: Medicare Other | Attending: Internal Medicine | Admitting: Internal Medicine

## 2019-05-17 DIAGNOSIS — K922 Gastrointestinal hemorrhage, unspecified: Secondary | ICD-10-CM | POA: Diagnosis not present

## 2019-05-17 DIAGNOSIS — I5021 Acute systolic (congestive) heart failure: Secondary | ICD-10-CM | POA: Diagnosis not present

## 2019-05-17 DIAGNOSIS — R7989 Other specified abnormal findings of blood chemistry: Secondary | ICD-10-CM

## 2019-05-17 DIAGNOSIS — R0902 Hypoxemia: Secondary | ICD-10-CM | POA: Diagnosis not present

## 2019-05-17 DIAGNOSIS — N184 Chronic kidney disease, stage 4 (severe): Secondary | ICD-10-CM | POA: Diagnosis present

## 2019-05-17 DIAGNOSIS — R778 Other specified abnormalities of plasma proteins: Secondary | ICD-10-CM | POA: Diagnosis not present

## 2019-05-17 DIAGNOSIS — L309 Dermatitis, unspecified: Secondary | ICD-10-CM | POA: Diagnosis present

## 2019-05-17 DIAGNOSIS — Z885 Allergy status to narcotic agent status: Secondary | ICD-10-CM

## 2019-05-17 DIAGNOSIS — Z515 Encounter for palliative care: Secondary | ICD-10-CM | POA: Diagnosis not present

## 2019-05-17 DIAGNOSIS — Z7902 Long term (current) use of antithrombotics/antiplatelets: Secondary | ICD-10-CM

## 2019-05-17 DIAGNOSIS — Z823 Family history of stroke: Secondary | ICD-10-CM

## 2019-05-17 DIAGNOSIS — D631 Anemia in chronic kidney disease: Secondary | ICD-10-CM | POA: Diagnosis present

## 2019-05-17 DIAGNOSIS — I959 Hypotension, unspecified: Secondary | ICD-10-CM | POA: Diagnosis present

## 2019-05-17 DIAGNOSIS — Z825 Family history of asthma and other chronic lower respiratory diseases: Secondary | ICD-10-CM

## 2019-05-17 DIAGNOSIS — I130 Hypertensive heart and chronic kidney disease with heart failure and stage 1 through stage 4 chronic kidney disease, or unspecified chronic kidney disease: Secondary | ICD-10-CM | POA: Diagnosis present

## 2019-05-17 DIAGNOSIS — F0390 Unspecified dementia without behavioral disturbance: Secondary | ICD-10-CM | POA: Diagnosis present

## 2019-05-17 DIAGNOSIS — F05 Delirium due to known physiological condition: Secondary | ICD-10-CM | POA: Diagnosis present

## 2019-05-17 DIAGNOSIS — R0602 Shortness of breath: Secondary | ICD-10-CM

## 2019-05-17 DIAGNOSIS — E872 Acidosis: Secondary | ICD-10-CM | POA: Diagnosis present

## 2019-05-17 DIAGNOSIS — I252 Old myocardial infarction: Secondary | ICD-10-CM

## 2019-05-17 DIAGNOSIS — K219 Gastro-esophageal reflux disease without esophagitis: Secondary | ICD-10-CM | POA: Diagnosis present

## 2019-05-17 DIAGNOSIS — G9341 Metabolic encephalopathy: Secondary | ICD-10-CM | POA: Diagnosis present

## 2019-05-17 DIAGNOSIS — Z85828 Personal history of other malignant neoplasm of skin: Secondary | ICD-10-CM

## 2019-05-17 DIAGNOSIS — I4811 Longstanding persistent atrial fibrillation: Secondary | ICD-10-CM

## 2019-05-17 DIAGNOSIS — N400 Enlarged prostate without lower urinary tract symptoms: Secondary | ICD-10-CM | POA: Diagnosis present

## 2019-05-17 DIAGNOSIS — Z7189 Other specified counseling: Secondary | ICD-10-CM | POA: Diagnosis not present

## 2019-05-17 DIAGNOSIS — I25110 Atherosclerotic heart disease of native coronary artery with unstable angina pectoris: Secondary | ICD-10-CM | POA: Diagnosis not present

## 2019-05-17 DIAGNOSIS — Z20822 Contact with and (suspected) exposure to covid-19: Secondary | ICD-10-CM | POA: Diagnosis present

## 2019-05-17 DIAGNOSIS — I739 Peripheral vascular disease, unspecified: Secondary | ICD-10-CM | POA: Diagnosis present

## 2019-05-17 DIAGNOSIS — Z87891 Personal history of nicotine dependence: Secondary | ICD-10-CM

## 2019-05-17 DIAGNOSIS — D62 Acute posthemorrhagic anemia: Secondary | ICD-10-CM | POA: Diagnosis not present

## 2019-05-17 DIAGNOSIS — M109 Gout, unspecified: Secondary | ICD-10-CM | POA: Diagnosis present

## 2019-05-17 DIAGNOSIS — N189 Chronic kidney disease, unspecified: Secondary | ICD-10-CM | POA: Diagnosis not present

## 2019-05-17 DIAGNOSIS — E860 Dehydration: Secondary | ICD-10-CM | POA: Insufficient documentation

## 2019-05-17 DIAGNOSIS — R531 Weakness: Secondary | ICD-10-CM | POA: Diagnosis not present

## 2019-05-17 DIAGNOSIS — K449 Diaphragmatic hernia without obstruction or gangrene: Secondary | ICD-10-CM | POA: Diagnosis present

## 2019-05-17 DIAGNOSIS — N179 Acute kidney failure, unspecified: Secondary | ICD-10-CM | POA: Diagnosis not present

## 2019-05-17 DIAGNOSIS — Z809 Family history of malignant neoplasm, unspecified: Secondary | ICD-10-CM

## 2019-05-17 DIAGNOSIS — J9811 Atelectasis: Secondary | ICD-10-CM | POA: Diagnosis not present

## 2019-05-17 DIAGNOSIS — Z66 Do not resuscitate: Secondary | ICD-10-CM | POA: Diagnosis present

## 2019-05-17 DIAGNOSIS — I214 Non-ST elevation (NSTEMI) myocardial infarction: Secondary | ICD-10-CM | POA: Diagnosis not present

## 2019-05-17 DIAGNOSIS — Z7982 Long term (current) use of aspirin: Secondary | ICD-10-CM

## 2019-05-17 DIAGNOSIS — Z981 Arthrodesis status: Secondary | ICD-10-CM

## 2019-05-17 DIAGNOSIS — R1011 Right upper quadrant pain: Secondary | ICD-10-CM | POA: Diagnosis not present

## 2019-05-17 DIAGNOSIS — Z820 Family history of epilepsy and other diseases of the nervous system: Secondary | ICD-10-CM

## 2019-05-17 DIAGNOSIS — I509 Heart failure, unspecified: Secondary | ICD-10-CM

## 2019-05-17 DIAGNOSIS — Z8249 Family history of ischemic heart disease and other diseases of the circulatory system: Secondary | ICD-10-CM

## 2019-05-17 LAB — URINALYSIS, ROUTINE W REFLEX MICROSCOPIC
Bilirubin Urine: NEGATIVE
Glucose, UA: NEGATIVE mg/dL
Ketones, ur: NEGATIVE mg/dL
Nitrite: NEGATIVE
Protein, ur: 100 mg/dL — AB
RBC / HPF: >50 RBC/hpf — ABNORMAL HIGH (ref 0–5)
Specific Gravity, Urine: 1.014 (ref 1.005–1.030)
pH: 5 (ref 5.0–8.0)

## 2019-05-17 LAB — COMPREHENSIVE METABOLIC PANEL
ALT: 18 U/L (ref 0–44)
AST: 19 U/L (ref 15–41)
Albumin: 3.2 g/dL — ABNORMAL LOW (ref 3.5–5.0)
Alkaline Phosphatase: 64 U/L (ref 38–126)
Anion gap: 11 (ref 5–15)
BUN: 102 mg/dL — ABNORMAL HIGH (ref 8–23)
CO2: 16 mmol/L — ABNORMAL LOW (ref 22–32)
Calcium: 9.2 mg/dL (ref 8.9–10.3)
Chloride: 112 mmol/L — ABNORMAL HIGH (ref 98–111)
Creatinine, Ser: 3.76 mg/dL — ABNORMAL HIGH (ref 0.61–1.24)
GFR calc Af Amer: 15 mL/min — ABNORMAL LOW (ref >60)
GFR calc non Af Amer: 13 mL/min — ABNORMAL LOW (ref >60)
Glucose, Bld: 123 mg/dL — ABNORMAL HIGH (ref 70–99)
Potassium: 5 mmol/L (ref 3.5–5.1)
Sodium: 139 mmol/L (ref 135–145)
Total Bilirubin: 0.5 mg/dL (ref 0.3–1.2)
Total Protein: 5.7 g/dL — ABNORMAL LOW (ref 6.5–8.1)

## 2019-05-17 LAB — TSH: TSH: 1.921 u[IU]/mL (ref 0.350–4.500)

## 2019-05-17 LAB — TROPONIN I (HIGH SENSITIVITY)
Troponin I (High Sensitivity): 25 ng/L — ABNORMAL HIGH (ref <18)
Troponin I (High Sensitivity): 24 ng/L — ABNORMAL HIGH (ref <18)

## 2019-05-17 LAB — CBC WITH DIFFERENTIAL/PLATELET
Abs Immature Granulocytes: 0.04 10*3/uL (ref 0.00–0.07)
Basophils Absolute: 0 10*3/uL (ref 0.0–0.1)
Basophils Relative: 1 %
Eosinophils Absolute: 0.1 10*3/uL (ref 0.0–0.5)
Eosinophils Relative: 1 %
HCT: 33.4 % — ABNORMAL LOW (ref 39.0–52.0)
Hemoglobin: 10.4 g/dL — ABNORMAL LOW (ref 13.0–17.0)
Immature Granulocytes: 1 %
Lymphocytes Relative: 16 %
Lymphs Abs: 1.1 10*3/uL (ref 0.7–4.0)
MCH: 32.7 pg (ref 26.0–34.0)
MCHC: 31.1 g/dL (ref 30.0–36.0)
MCV: 105 fL — ABNORMAL HIGH (ref 80.0–100.0)
Monocytes Absolute: 0.9 10*3/uL (ref 0.1–1.0)
Monocytes Relative: 14 %
Neutro Abs: 4.5 10*3/uL (ref 1.7–7.7)
Neutrophils Relative %: 67 %
Platelets: 210 10*3/uL (ref 150–400)
RBC: 3.18 MIL/uL — ABNORMAL LOW (ref 4.22–5.81)
RDW: 13 % (ref 11.5–15.5)
WBC: 6.6 10*3/uL (ref 4.0–10.5)
nRBC: 0 % (ref 0.0–0.2)

## 2019-05-17 LAB — CBG MONITORING, ED: Glucose-Capillary: 104 mg/dL — ABNORMAL HIGH (ref 70–99)

## 2019-05-17 LAB — LIPASE, BLOOD: Lipase: 32 U/L (ref 11–51)

## 2019-05-17 LAB — LACTIC ACID, PLASMA: Lactic Acid, Venous: 1.6 mmol/L (ref 0.5–1.9)

## 2019-05-17 MED ORDER — BRIMONIDINE TARTRATE 0.2 % OP SOLN
1.0000 [drp] | Freq: Three times a day (TID) | OPHTHALMIC | Status: DC
  Administered 2019-05-17 – 2019-05-19 (×6): 1 [drp] via OPHTHALMIC
  Filled 2019-05-17: qty 5

## 2019-05-17 MED ORDER — LATANOPROST 0.005 % OP SOLN
1.0000 [drp] | Freq: Every day | OPHTHALMIC | Status: DC
  Administered 2019-05-17 – 2019-05-18 (×2): 1 [drp] via OPHTHALMIC
  Filled 2019-05-17: qty 2.5

## 2019-05-17 MED ORDER — HEPARIN SODIUM (PORCINE) 5000 UNIT/ML IJ SOLN
5000.0000 [IU] | Freq: Two times a day (BID) | INTRAMUSCULAR | Status: DC
  Administered 2019-05-17 – 2019-05-18 (×2): 5000 [IU] via SUBCUTANEOUS
  Filled 2019-05-17 (×2): qty 1

## 2019-05-17 MED ORDER — ADULT MULTIVITAMIN W/MINERALS CH
1.0000 | ORAL_TABLET | Freq: Every day | ORAL | Status: DC
  Administered 2019-05-17 – 2019-05-19 (×3): 1 via ORAL
  Filled 2019-05-17 (×3): qty 1

## 2019-05-17 MED ORDER — DORZOLAMIDE HCL-TIMOLOL MAL 2-0.5 % OP SOLN
1.0000 [drp] | Freq: Two times a day (BID) | OPHTHALMIC | Status: DC
  Administered 2019-05-17 – 2019-05-19 (×4): 1 [drp] via OPHTHALMIC
  Filled 2019-05-17: qty 10

## 2019-05-17 MED ORDER — LACTATED RINGERS IV BOLUS
500.0000 mL | Freq: Once | INTRAVENOUS | Status: AC
  Administered 2019-05-17: 500 mL via INTRAVENOUS

## 2019-05-17 MED ORDER — DONEPEZIL HCL 5 MG PO TABS
5.0000 mg | ORAL_TABLET | Freq: Every day | ORAL | Status: DC
  Administered 2019-05-17 – 2019-05-19 (×3): 5 mg via ORAL
  Filled 2019-05-17 (×4): qty 1

## 2019-05-17 MED ORDER — RANOLAZINE ER 500 MG PO TB12
500.0000 mg | ORAL_TABLET | Freq: Two times a day (BID) | ORAL | Status: DC
  Administered 2019-05-17 – 2019-05-19 (×4): 500 mg via ORAL
  Filled 2019-05-17 (×4): qty 1

## 2019-05-17 MED ORDER — LABETALOL HCL 5 MG/ML IV SOLN: 5.0000 mg | INTRAVENOUS | Status: DC | PRN

## 2019-05-17 MED ORDER — ASCORBIC ACID 500 MG PO TABS
500.0000 mg | ORAL_TABLET | Freq: Every day | ORAL | Status: DC
  Administered 2019-05-17 – 2019-05-19 (×3): 500 mg via ORAL
  Filled 2019-05-17 (×3): qty 1

## 2019-05-17 MED ORDER — NITROGLYCERIN 0.4 MG SL SUBL
0.4000 mg | SUBLINGUAL_TABLET | SUBLINGUAL | Status: DC | PRN
  Administered 2019-05-17 (×2): 0.4 mg via SUBLINGUAL
  Filled 2019-05-17: qty 1

## 2019-05-17 MED ORDER — ONDANSETRON HCL 4 MG/2ML IJ SOLN: 4.0000 mg | Freq: Four times a day (QID) | INTRAMUSCULAR | Status: DC | PRN

## 2019-05-17 MED ORDER — HALOPERIDOL LACTATE 5 MG/ML IJ SOLN
1.0000 mg | Freq: Four times a day (QID) | INTRAMUSCULAR | Status: AC | PRN
  Administered 2019-05-17 – 2019-05-19 (×2): 1 mg via INTRAVENOUS
  Filled 2019-05-17 (×2): qty 1

## 2019-05-17 MED ORDER — ASPIRIN EC 81 MG PO TBEC
81.0000 mg | DELAYED_RELEASE_TABLET | Freq: Every day | ORAL | Status: DC
  Administered 2019-05-17 – 2019-05-19 (×3): 81 mg via ORAL
  Filled 2019-05-17 (×3): qty 1

## 2019-05-17 MED ORDER — SODIUM CHLORIDE 0.9 % IV SOLN: INTRAVENOUS | Status: DC

## 2019-05-17 MED ORDER — ROSUVASTATIN CALCIUM 5 MG PO TABS
10.0000 mg | ORAL_TABLET | Freq: Every evening | ORAL | Status: DC
  Administered 2019-05-17 – 2019-05-19 (×3): 10 mg via ORAL
  Filled 2019-05-17 (×3): qty 2

## 2019-05-17 MED ORDER — ACETAMINOPHEN 500 MG PO TABS
1000.0000 mg | ORAL_TABLET | Freq: Every day | ORAL | Status: DC | PRN
  Filled 2019-05-17: qty 2

## 2019-05-17 MED ORDER — CLOPIDOGREL BISULFATE 75 MG PO TABS
75.0000 mg | ORAL_TABLET | Freq: Every day | ORAL | Status: DC
  Administered 2019-05-17 – 2019-05-18 (×2): 75 mg via ORAL
  Filled 2019-05-17 (×2): qty 1

## 2019-05-17 MED ORDER — METOPROLOL TARTRATE 12.5 MG HALF TABLET: 12.5000 mg | ORAL_TABLET | Freq: Four times a day (QID) | ORAL | Status: DC | PRN

## 2019-05-17 MED ORDER — DUTASTERIDE 0.5 MG PO CAPS
0.5000 mg | ORAL_CAPSULE | Freq: Every day | ORAL | Status: DC
  Administered 2019-05-18 – 2019-05-19 (×2): 0.5 mg via ORAL
  Filled 2019-05-17 (×4): qty 1

## 2019-05-17 MED ORDER — NITROGLYCERIN 0.4 MG SL SUBL
0.4000 mg | SUBLINGUAL_TABLET | SUBLINGUAL | Status: DC | PRN
  Administered 2019-05-17 – 2019-05-19 (×3): 0.4 mg via SUBLINGUAL
  Filled 2019-05-17 (×4): qty 1

## 2019-05-17 MED ORDER — FERROUS SULFATE 325 (65 FE) MG PO TABS
325.0000 mg | ORAL_TABLET | Freq: Two times a day (BID) | ORAL | Status: DC
  Administered 2019-05-17 – 2019-05-19 (×5): 325 mg via ORAL
  Filled 2019-05-17 (×5): qty 1

## 2019-05-17 MED ORDER — METOPROLOL TARTRATE 25 MG PO TABS
25.0000 mg | ORAL_TABLET | Freq: Once | ORAL | Status: AC
  Administered 2019-05-17: 25 mg via ORAL
  Filled 2019-05-17: qty 1

## 2019-05-17 NOTE — ED Notes (Signed)
Culture sent in addition to UA 

## 2019-05-17 NOTE — Progress Notes (Signed)
New Admission Note:   Arrival Method: Arrived from ED via stretcher Mental Orientation: Alert,oriented to person Telemetry: Box #14 Assessment: Completed Skin: See doc flowsheet IV: Rt AC, Lt AC Pain: 0/10 Tubes: N/A Safety Measures: Safety Fall Prevention Plan has been discussed.  Admission: Unable to complete,patient is confused 5MW Orientation: Patient has been orientated to the room, unit and staff.  Family: None at bedside  Orders have been reviewed and implemented. Will continue to monitor the patient. Call light has been placed within reach and bed alarm has been activated.   Tahesha Skeet [employer], RN-BC Phone number: [phone]

## 2019-05-17 NOTE — ED Provider Notes (Addendum)
[facility] EMERGENCY DEPARTMENT Provider Note   CSN: [id number] Arrival date & time: 06/02/2019  1110     History Chief Complaint  Patient presents with  . Weakness  . Hypotension    Justin [name] is a 86 y.o. male.  Patient is a 86 year old gentleman with a history of atrial fibrillation, AAA, chronic kidney disease, carotid artery disease, CAD, dementia and BPH who is presenting today with vague symptoms of not being himself.  Patient's daughter gives the majority of the medical history but reports for the last 3 days he has been refusing to eat and generally weak.  There has been a recent change in his routine as his wife had a fall who she was the primary caregiver and had to be out of the home for a while but is now back home but has seemed to throw things off.  He has not had fever, vomiting, cough or shortness of breath.  Daughter reports that he complains of chest pain almost daily and takes at least 1 or 2 nitroglycerin every day or every other day due to his known chronic disease.  That has not changed.  She is concerned that he is not having normal bowel movements and having some difficulty urinating.  He is also become more confused than his typical baseline and has been intermittently agitated which occasionally can happen.  He is vaccinated against Covid.  No recent medication changes.  The history is provided by the patient, a caregiver and medical records.  Weakness      Past Medical History:  Diagnosis Date  . AAA (abdominal aortic aneurysm) ([place])    "we've known about it since ~ 2000"  . Arthritis    in back and hands:  Gout  . Atrial fibrillation ([place])    a. 04/2011 post op  . BPH (benign prostatic hyperplasia)   . Bronchitis    hx of; "once or twice in my life" (04/21/11)  . Cancer ([place])    Basal Cell carcinoma- per Dr Jacquiline Doe office notes  . Carotid artery disease ([place])   . Chronic kidney disease    stage 3 - per Dr Jacquiline Doe office notes    . Chronic lower back pain 1995  . Eczema   . GERD (gastroesophageal reflux disease)   . Gout of big toe    "h/o in both"  . Hiatal hernia   . History of kidney stones   . Hypertension   . Lumbar spondylosis   . Lumbar stenosis    a. s/p  anterolateral decompression and lateral plating 04/21/2011  . Migraine    "left ~ 1980's"  . Stomach ulcer    "years ago; treated w/diet"     Patient Active Problem List   Diagnosis Date Noted  . Shortness of breath 03/27/2019  . Bilateral impacted cerumen 08/16/2018  . Foreign body in right ear 08/16/2018  . Presbycusis of both ears 08/16/2018  . AAA (abdominal aortic aneurysm) without rupture ([place]) 07/21/2018  . Anemia 12/06/2017  . Coronary artery disease 11/24/2017  . Dementia ([place]) 11/24/2017  . Essential hypertension 11/24/2017  . Bilateral extracranial carotid artery stenosis 11/24/2017  . NSTEMI (non-ST elevated myocardial infarction) ([place]) 11/20/2017  . Conjunctival hemorrhage of left eye 08/03/2017  . Intermediate stage nonexudative age-related macular degeneration of both eyes 08/03/2017  . Pseudophakia of both eyes 08/03/2017  . S/P AAA repair 06/03/2016  . Primary open angle glaucoma of both eyes, mild stage 06/17/2014  . Atrial fibrillation (  [place]) 04/23/2011  . Abdominal aneurysm without mention of rupture 02/23/2011    Past Surgical History:  Procedure Laterality Date  . ABDOMINAL AORTA STENT  06/03/2016   ABDOMINAL AORTIC ENDOVASCULAR STENT GRAFT insertion (N/A)  . ABDOMINAL AORTIC ENDOVASCULAR STENT GRAFT N/A 06/03/2016   Procedure: ABDOMINAL AORTIC ENDOVASCULAR STENT GRAFT insertion;  Surgeon: Serafina Mitchell, MD;  Location: [facility];  Service: Vascular;  Laterality: N/A;  . ANTERIOR LAT LUMBAR FUSION  04/21/11   w/PEEK cage, allograft, and lateral plate (r)  . ANTERIOR LAT LUMBAR FUSION  04/21/2011   Procedure: ANTERIOR LATERAL LUMBAR FUSION 1 LEVEL;  Surgeon: Erline Levine, MD;  Location: [facility];  Service:  Neurosurgery;  Laterality: Right;  RIGHT Lumbar three-four anterolateral decompression with fusion and lateral plating  . APPENDECTOMY  1946  . BACK SURGERY  04/21/11   "today makes the 5th time"  . CATARACT EXTRACTION, BILATERAL    . EYE SURGERY Bilateral    Cataract  . LEFT HEART CATH AND CORONARY ANGIOGRAPHY N/A 11/22/2017   Procedure: LEFT HEART CATH AND CORONARY ANGIOGRAPHY;  Surgeon: Nigel Mormon, MD;  Location: [facility];  Service: Cardiovascular;  Laterality: N/A;  . PROSTATE SURGERY    . SPINE SURGERY     X's 5   . TONSILLECTOMY     "as a child" and Adneoids       Family History  Problem Relation Age of Onset  . Alzheimer's disease Mother        died @ 36  . COPD Father        died @ 69  . Cancer Father   . Hypertension Father   . Heart disease Father   . Stroke Sister   . Cancer Brother        stomach  . Heart disease Brother        Aneurysm  . Hypertension Son   . Heart disease Son        Aneurysm  . Hypertension Son   . Heart disease Son        Aneurysm  . Anesthesia problems Neg Hx   . Hypotension Neg Hx   . Malignant hyperthermia Neg Hx   . Pseudochol deficiency Neg Hx     Social History   Tobacco Use  . Smoking status: Former Smoker    Packs/day: 1.00    Years: 20.00    Pack years: 20.00    Types: Cigarettes    Quit date: 02/10/1980    Years since quitting: 39.2  . Smokeless tobacco: Never Used  Substance Use Topics  . Alcohol use: No  . Drug use: No    Home Medications Prior to Admission medications   Medication Sig Start Date End Date Taking? Authorizing Provider  acetaminophen (TYLENOL) 500 MG tablet Take 1,000 mg by mouth daily as needed (back pain).    [provider]  aspirin EC 81 MG tablet Take 81 mg by mouth daily.     [provider]  brimonidine (ALPHAGAN) 0.2 % ophthalmic solution INSTILL 1 DROP INTO BOTH EYES 2 TIMES DAILY 09/12/18   [provider]  clopidogrel (PLAVIX) 75 MG tablet  Take 1 tablet (75 mg total) by mouth daily. 07/22/18   Patwardhan, Manish J, MD  COLCRYS 0.6 MG tablet Take 0.6 mg by mouth daily. 09/16/18   [provider]  donepezil (ARICEPT) 5 MG tablet Take 5 mg by mouth daily. 11/07/17   [provider]  dorzolamide-timolol (COSOPT) 22.3-6.8 MG/ML  ophthalmic solution Place 1 drop into both eyes 2 times daily. 09/12/18   [provider]  dutasteride (AVODART) 0.5 MG capsule Take 0.5 mg by mouth. W,F    [provider]  FEROSUL 325 (65 Fe) MG tablet TAKE 1 TABLET BY MOUTH 2 TIMES DAILY 04/06/19   Patwardhan, Reynold Bowen, MD  furosemide (LASIX) 20 MG tablet Take 20 mg by mouth. M,W,F    [provider]  furosemide (LASIX) 20 MG tablet TAKE 1 TABLET BY MOUTH DAILY. (TAKE in ADDITION TO FUROSEMIDE 20 MG EVERY OTHER DAY) 04/26/19   Patwardhan, Reynold Bowen, MD  latanoprost (XALATAN) 0.005 % ophthalmic solution  10/07/18   [provider]  lisinopril (ZESTRIL) 20 MG tablet TAKE 1 TABLET BY MOUTH EVERY DAY 05/11/19   Patwardhan, Manish J, MD  metoprolol succinate (TOPROL-XL) 25 MG 24 hr tablet TAKE 1 TABLET BY MOUTH EVERY DAY 01/19/19   Patwardhan, Manish J, MD  Multiple Vitamin (MULTIVITAMIN) capsule Take 1 capsule by mouth daily.    [provider]  Multiple Vitamins-Minerals (SYSTANE ICAPS AREDS2) TABS  12/07/18   [provider]  nitroGLYCERIN (NITROSTAT) 0.4 MG SL tablet PLACE ONE TABLET UNDER TONGUE AS NEEDED FOR CHEST PAIN EVERY 5 MINUTES 02/21/19   Patwardhan, Reynold Bowen, MD  potassium chloride (K-DUR) 10 MEQ tablet M,W,F 05/02/18   [provider]  ranolazine (RANEXA) 500 MG 12 hr tablet TAKE 1 TABLET BY MOUTH 2 TIMES DAILY 04/06/19   Patwardhan, Manish J, MD  rosuvastatin (CRESTOR) 10 MG tablet TAKE 1 TABLET BY MOUTH DAILY AT 6 PM 07/22/18   Patwardhan, Manish J, MD  Travoprost, BAK Free, (TRAVATAN Z) 0.004 % SOLN ophthalmic solution Place 1 drop into the left eye daily. 09/12/18   [provider]  vitamin C (ASCORBIC ACID) 500 MG tablet  12/07/18   [provider]    Allergies    Demerol  Review of Systems   Review of Systems  Neurological: Positive for weakness.  All other systems reviewed and are negative.   Physical Exam Updated Vital Signs BP 139/82   Pulse (!) 119   Resp (!) 26   Ht 5\' 10"  (1.778 m)   Wt 68 kg   SpO2 100%   BMI 21.52 kg/m   Physical Exam Vitals and nursing note reviewed.  Constitutional:      General: He is not in acute distress.    Appearance: Normal appearance. He is well-developed and normal weight.  HENT:     Head: Normocephalic and atraumatic.     Mouth/Throat:     Mouth: Mucous membranes are dry.  Eyes:     Conjunctiva/sclera: Conjunctivae normal.     Pupils: Pupils are equal, round, and reactive to light.  Cardiovascular:     Rate and Rhythm: Normal rate. Rhythm irregularly irregular.     Heart sounds: No murmur.  Pulmonary:     Effort: Pulmonary effort is normal. No respiratory distress.     Breath sounds: Normal breath sounds. No wheezing or rales.  Chest:     Chest [place]: No tenderness.  Abdominal:     General: Bowel sounds are normal. There is no distension.     Palpations: Abdomen is soft.     Tenderness: There is abdominal tenderness in the right upper quadrant. There is guarding. There is no rebound.  Musculoskeletal:        General: No tenderness. Normal range of motion.     Cervical back: Normal range of motion and  neck supple.     Right lower leg: No edema.     Left lower leg: No edema.  Skin:    General: Skin is warm and dry.     Findings: No erythema or rash.  Neurological:     General: No focal deficit present.     Mental Status: He is alert.     Sensory: No sensory deficit.     Motor: No weakness.     Comments: Oriented to person place and time however does intermittently ask questions that do not make sense.  Has some difficulty answering questions other than yes and no.  Psychiatric:      Comments: Calm and cooperative     ED Results / Procedures / Treatments   Labs (all labs ordered are listed, but only abnormal results are displayed) Labs Reviewed  CBC WITH DIFFERENTIAL/PLATELET - Abnormal; Notable for the following components:      Result Value   RBC 3.18 (*)    Hemoglobin 10.4 (*)    HCT 33.4 (*)    MCV 105.0 (*)    All other components within normal limits  COMPREHENSIVE METABOLIC PANEL - Abnormal; Notable for the following components:   Chloride 112 (*)    CO2 16 (*)    Glucose, Bld 123 (*)    BUN 102 (*)    Creatinine, Ser 3.76 (*)    Total Protein 5.7 (*)    Albumin 3.2 (*)    GFR calc non Af Amer 13 (*)    GFR calc Af Amer 15 (*)    All other components within normal limits  URINALYSIS, ROUTINE W REFLEX MICROSCOPIC - Abnormal; Notable for the following components:   APPearance HAZY (*)    Hgb urine dipstick MODERATE (*)    Protein, ur 100 (*)    Leukocytes,Ua SMALL (*)    RBC / HPF >50 (*)    Bacteria, UA FEW (*)    All other components within normal limits  CBG MONITORING, ED - Abnormal; Notable for the following components:   Glucose-Capillary 104 (*)    All other components within normal limits  TROPONIN I (HIGH SENSITIVITY) - Abnormal; Notable for the following components:   Troponin I (High Sensitivity) 25 (*)    All other components within normal limits  TROPONIN I (HIGH SENSITIVITY) - Abnormal; Notable for the following components:   Troponin I (High Sensitivity) 24 (*)    All other components within normal limits  URINE CULTURE  LIPASE, BLOOD  LACTIC ACID, PLASMA  TSH    EKG EKG Interpretation  Date/Time:  Wednesday May 17 2019 11:16:10 EDT Ventricular Rate:  77 PR Interval:    QRS Duration: 106 QT Interval:  387 QTC Calculation: 438 R Axis:   12 Text Interpretation: Sinus rhythm Minimal ST depression, lateral leads Baseline wander in lead(s) V5 V6 No significant change since last tracing Confirmed by Blanchie Dessert  ([id number]) on 05/14/2019 11:50:57 AM   Radiology DG Chest Port 1 View  Result Date: 05/31/2019 CLINICAL DATA:  Weakness. EXAM: PORTABLE CHEST 1 VIEW COMPARISON:  November 20, 2017. FINDINGS: Stable cardiomediastinal silhouette. No pneumothorax or pleural effusion is noted. Both lungs are clear. The visualized skeletal structures are unremarkable. IMPRESSION: No acute cardiopulmonary abnormality seen. Electronically Signed   By: Marijo Conception M.D.   On: 05/28/2019 11:53   US Abdomen Limited RUQ  Result Date: 06/06/2019 CLINICAL DATA:  Right upper quadrant pain EXAM: ULTRASOUND ABDOMEN LIMITED RIGHT UPPER QUADRANT COMPARISON:  None.  FINDINGS: Gallbladder: No gallstones or Granade thickening visualized. No sonographic Murphy sign noted by sonographer. Common bile duct: Diameter: 2 mm Liver: No focal lesion identified. Within normal limits in parenchymal echogenicity. Portal vein is patent on color Doppler imaging with normal direction of blood flow towards the liver. Other: Right renal cortical thinning. IMPRESSION: No findings to account for reported symptoms. Electronically Signed   By: Macy Mis M.D.   On: 05/18/2019 12:32    Procedures Procedures (including critical care time)  Medications Ordered in ED Medications  lactated ringers bolus 500 mL (has no administration in time range)    ED Course  I have reviewed the triage vital signs and the nursing notes.  Pertinent labs & imaging results that were available during my care of the patient were reviewed by me and considered in my medical decision making (see chart for details).    MDM Rules/Calculators/A&P                      Elderly male with multiple medical problems presenting today with generalized weakness and refusing to eat over the last 3 days.  On exam patient is in no acute distress but was hypotensive per paramedics and did receive 200 mL of fluid in route.  Patient has had soft blood pressures here between 98 systolic and 517.   He has no complaints at this time is in no acute distress.  He does have some right upper quadrant tenderness concerning for possible cholecystitis as the cause of his refusing to eat and weakness however also concern for dehydration, AKI, atypical cardiac pathology or UTI.  He has no lower abdominal pain concerning for diverticulitis or perforation at this time.  Patient will be given gentle bolus of fluid and labs are pending.  Including evaluation for thyroid disease.  3:03 PM Troponin is flat at 25 and 24, lipase, lactate, TSH are all without acute findings.  CBC without acute findings with chronic anemia, CMP with new AKI with creatinine of 3.76 but normal LFTs, potassium and sodium.  Bladder scan was negative for urinary retention.  UA is pending to ensure no signs of UTI.  While patient was waiting his heart rate increased and he started complaining of chest pain and holding the left side of his chest.  Daughter reports when he feels this way at home he gets a nitroglycerin drip.  Patient was found to be in A. fib with a heart rate between 115-1 30.  Blood pressure has improved.  Patient will be given a nitroglycerin.  Also he has not taken any home meds today.  Patient is receiving IV hydration. Ultrasound is negative for cholecystitis and chest x-ray without acute findings.  3:16 PM UA without significant findings for UTI.  We will continue hydrating and admit for acute kidney injury.  3:45 PM For 2 nitroglycerin patient reports his pain is much better.  He was given oral metoprolol has he has not had his home medications today.  Patient admitted to the hospitalist service. CRITICAL CARE Performed by: Karl Knarr Total critical care time: 30 minutes Critical care time was exclusive of separately billable procedures and treating other patients. Critical care was necessary to treat or prevent imminent or life-threatening deterioration. Critical care was time spent personally by me on the  following activities: development of treatment plan with patient and/or surrogate as well as nursing, discussions with consultants, evaluation of patient's response to treatment, examination of patient, obtaining history from patient or  surrogate, ordering and performing treatments and interventions, ordering and review of laboratory studies, ordering and review of radiographic studies, pulse oximetry and re-evaluation of patient's condition.   MDM Number of Diagnoses or Management Options AKI (acute kidney injury) ([place]): new, needed workup Dehydration: new, needed workup Longstanding persistent atrial fibrillation ([place]): established, worsening   Amount and/or Complexity of Data Reviewed Clinical lab tests: ordered and reviewed Tests in the radiology section of CPT: ordered and reviewed Tests in the medicine section of CPT: ordered and reviewed Discussion of test results with the performing providers: yes Decide to obtain previous medical records or to obtain history from someone other than the patient: yes Obtain history from someone other than the patient: yes Review and summarize past medical records: yes Discuss the patient with other providers: yes Independent visualization of images, tracings, or specimens: yes  Risk of Complications, Morbidity, and/or Mortality Presenting problems: high Diagnostic procedures: moderate Management options: moderate  Patient Progress Patient progress: stable   Final Clinical Impression(s) / ED Diagnoses Final diagnoses:  RUQ abdominal pain  AKI (acute kidney injury) ([place])  Dehydration  Longstanding persistent atrial fibrillation [facility])    Rx / DC Orders ED Discharge Orders    None       Blanchie Dessert, MD 06/02/2019 1507    Blanchie Dessert, MD 05/13/2019 1517    Blanchie Dessert, MD 06/01/2019 1545

## 2019-05-17 NOTE — ED Notes (Signed)
Pain relieved but not resolved prior to second Nitro.

## 2019-05-17 NOTE — Progress Notes (Signed)
Patient has been confused, combative, and wandering. Posing risk to self and others. Start delirium precautions and low-dose Haldol as needed.

## 2019-05-17 NOTE — ED Triage Notes (Signed)
Pt BIB [facility], called by family for not eating or drinking and generalized weakness for past three days, not acting normal self. No symptoms of illness, nausea/vommitting/diharrea, cough, fever, or pain.  Hypotensive for EMS initally 80/40, up to 112/64 after bolus. All other VSS. CBG 142.

## 2019-05-17 NOTE — ED Notes (Signed)
Pt back from Ultrasound

## 2019-05-17 NOTE — ED Notes (Signed)
Bladder scan 40mL 

## 2019-05-17 NOTE — H&P (Signed)
History and Physical    Ezriel Boffa Mccarney XLK:[id number] DOB: 1923/02/23 DOA: 06/06/2019  PCP: Burnard Bunting, MD   Patient coming from: Home  I have personally briefly reviewed patient's old medical records in [place]  Chief Complaint: Weakness  HPI: Justin [name] is a 86 y.o. male with medical history significant of advanced dementia, paroxysmal atrial fibrillation, AAA, chronic kidney disease, carotid artery disease, CAD, dementia, PAD and BPH presented with weakness, worsening of confusion.  Patient's daughter gives the majority of the medical history.plan advanced dementia, main caregiver is his wife.  Wife had a broken arm and hospitalized about 2 weeks ago, and ever since then, patient started to have worsening of his baseline mental status.  Became more confused, and eat and drink less.  Patient son has been living with the patient for last 2 weeks, and reported the patient has had multiple diarrhea for last 3 to 4 days.  No vomiting, cough or shortness of breath.    Patient was also diagnosed with severe CAD, and was started on Ranexa, and his cardiology considered conservative management only without any aggressive intervention because of patient age and comorbidities.  Daughter reports that he complains of chest pain almost daily and takes at least 1 or 2 nitroglycerin every night or morning, and usually can relieve the pain within 3 to 5 minutes.  Despite what happened in the last few weeks, patient still received all his blood pressure meds including water pills till yesterday.  ED Course: Borderline hypotension, attaining 3.76, normal LFT potassium and sodium, bladder scan negative.  Abdomen ultrasound negative for any kidney etiology.  Review of Systems: Unable to perform patient confused.   Past Medical History:  Diagnosis Date  . AAA (abdominal aortic aneurysm) ([place])    "we've known about it since ~ 2000"  . Arthritis    in back and hands:  Gout  . Atrial fibrillation  ([place])    a. 04/2011 post op  . BPH (benign prostatic hyperplasia)   . Bronchitis    hx of; "once or twice in my life" (04/21/11)  . Cancer ([place])    Basal Cell carcinoma- per Dr Jacquiline Doe office notes  . Carotid artery disease ([place])   . Chronic kidney disease    stage 3 - per Dr Jacquiline Doe office notes  . Chronic lower back pain 1995  . Eczema   . GERD (gastroesophageal reflux disease)   . Gout of big toe    "h/o in both"  . Hiatal hernia   . History of kidney stones   . Hypertension   . Lumbar spondylosis   . Lumbar stenosis    a. s/p  anterolateral decompression and lateral plating 04/21/2011  . Migraine    "left ~ 1980's"  . Stomach ulcer    "years ago; treated w/diet"     Past Surgical History:  Procedure Laterality Date  . ABDOMINAL AORTA STENT  06/03/2016   ABDOMINAL AORTIC ENDOVASCULAR STENT GRAFT insertion (N/A)  . ABDOMINAL AORTIC ENDOVASCULAR STENT GRAFT N/A 06/03/2016   Procedure: ABDOMINAL AORTIC ENDOVASCULAR STENT GRAFT insertion;  Surgeon: Serafina Mitchell, MD;  Location: [facility];  Service: Vascular;  Laterality: N/A;  . ANTERIOR LAT LUMBAR FUSION  04/21/11   w/PEEK cage, allograft, and lateral plate (r)  . ANTERIOR LAT LUMBAR FUSION  04/21/2011   Procedure: ANTERIOR LATERAL LUMBAR FUSION 1 LEVEL;  Surgeon: Erline Levine, MD;  Location: [facility];  Service: Neurosurgery;  Laterality: Right;  RIGHT Lumbar three-four  anterolateral decompression with fusion and lateral plating  . APPENDECTOMY  1946  . BACK SURGERY  04/21/11   "today makes the 5th time"  . CATARACT EXTRACTION, BILATERAL    . EYE SURGERY Bilateral    Cataract  . LEFT HEART CATH AND CORONARY ANGIOGRAPHY N/A 11/22/2017   Procedure: LEFT HEART CATH AND CORONARY ANGIOGRAPHY;  Surgeon: Nigel Mormon, MD;  Location: [place] [facility];  Service: Cardiovascular;  Laterality: N/A;  . PROSTATE SURGERY    . SPINE SURGERY     X's 5   . TONSILLECTOMY     "as a child" and Adneoids     reports that he quit  smoking about 39 years ago. His smoking use included cigarettes. He has a 20.00 pack-year smoking history. He has never used smokeless tobacco. He reports that he does not drink alcohol or use drugs.  Allergies  Allergen Reactions  . Demerol Anaphylaxis  . Shellfish Allergy     Causes gout to be worse. / brings it on.     Family History  Problem Relation Age of Onset  . Alzheimer's disease Mother        died @ 77  . COPD Father        died @ 28  . Cancer Father   . Hypertension Father   . Heart disease Father   . Stroke Sister   . Cancer Brother        stomach  . Heart disease Brother        Aneurysm  . Hypertension Son   . Heart disease Son        Aneurysm  . Hypertension Son   . Heart disease Son        Aneurysm  . Anesthesia problems Neg Hx   . Hypotension Neg Hx   . Malignant hyperthermia Neg Hx   . Pseudochol deficiency Neg Hx      Prior to Admission medications   Medication Sig Start Date End Date Taking? Authorizing Provider  acetaminophen (TYLENOL) 500 MG tablet Take 1,000 mg by mouth daily as needed (back pain).   Yes [provider]  aspirin EC 81 MG tablet Take 81 mg by mouth daily.    Yes [provider]  brimonidine (ALPHAGAN) 0.2 % ophthalmic solution Place 1 drop into both eyes in the morning and at bedtime.  09/12/18  Yes [provider]  clopidogrel (PLAVIX) 75 MG tablet Take 1 tablet (75 mg total) by mouth daily. 07/22/18  Yes Patwardhan, Manish J, MD  COLCRYS 0.6 MG tablet Take 0.6 mg by mouth daily. 09/16/18  Yes [provider]  donepezil (ARICEPT) 5 MG tablet Take 5 mg by mouth daily. 11/07/17  Yes [provider]  dorzolamide-timolol (COSOPT) 22.3-6.8 MG/ML ophthalmic solution Place 1 drop into both eyes 2 (two) times daily.  09/12/18  Yes [provider]  dutasteride (AVODART) 0.5 MG capsule Take 0.5 mg by mouth daily. W,F    Yes [provider]  FEROSUL 325 (65 Fe) MG tablet TAKE 1 TABLET BY  MOUTH 2 TIMES DAILY Patient taking differently: Take 325 mg by mouth in the morning and at bedtime.  04/06/19  Yes Patwardhan, Manish J, MD  furosemide (LASIX) 20 MG tablet Take 20 mg by mouth 3 (three) times a week. M,W,F    Yes [provider]  furosemide (LASIX) 20 MG tablet TAKE 1 TABLET BY MOUTH DAILY. (TAKE in ADDITION TO FUROSEMIDE 20 MG EVERY OTHER DAY) Patient taking differently:  Take 20 mg by mouth every other day. M,W,F 04/26/19  Yes Patwardhan, Manish J, MD  lisinopril (ZESTRIL) 20 MG tablet TAKE 1 TABLET BY MOUTH EVERY DAY Patient taking differently: Take 20 mg by mouth daily.  05/11/19  Yes Patwardhan, Manish J, MD  metoprolol succinate (TOPROL-XL) 25 MG 24 hr tablet TAKE 1 TABLET BY MOUTH EVERY DAY Patient taking differently: Take 25 mg by mouth daily.  01/19/19  Yes Patwardhan, Manish J, MD  Multiple Vitamin (MULTIVITAMIN) capsule Take 1 capsule by mouth daily.   Yes [provider]  Multiple Vitamins-Minerals (SYSTANE ICAPS AREDS2) TABS Take 1 capsule by mouth daily.  12/07/18  Yes [provider]  nitroGLYCERIN (NITROSTAT) 0.4 MG SL tablet PLACE ONE TABLET UNDER TONGUE AS NEEDED FOR CHEST PAIN EVERY 5 MINUTES Patient taking differently: Place 0.4 mg under the tongue every 5 (five) minutes as needed for chest pain.  02/21/19  Yes Patwardhan, Manish J, MD  potassium chloride (K-DUR) 10 MEQ tablet Take 10 mEq by mouth daily.  05/02/18  Yes [provider]  ranolazine (RANEXA) 500 MG 12 hr tablet TAKE 1 TABLET BY MOUTH 2 TIMES DAILY Patient taking differently: Take 500 mg by mouth 2 (two) times daily.  04/06/19  Yes Patwardhan, Manish J, MD  rosuvastatin (CRESTOR) 10 MG tablet TAKE 1 TABLET BY MOUTH DAILY AT 6 PM Patient taking differently: Take 10 mg by mouth every evening.  07/22/18  Yes Patwardhan, Manish J, MD  Travoprost, BAK Free, (TRAVATAN Z) 0.004 % SOLN ophthalmic solution Place 1 drop into the left eye every other day.  09/12/18  Yes [provider]  vitamin C (ASCORBIC ACID) 500 MG tablet Take 500 mg by mouth daily.  12/07/18  Yes [provider]    Physical Exam: Vitals:   05/21/2019 1700 05/14/2019 1715 05/18/2019 1730 05/22/2019 1745  BP: 107/63 (!) 97/59 100/61 103/63  Pulse: (!) 118 (!) 115 (!) 105 (!) 102  Resp: (!) 22 (!) 22 18 (!) 22  SpO2: 98% 99% 98% 96%  Weight:      Height:        Constitutional: NAD, calm, comfortable Vitals:   05/13/2019 1700 05/31/2019 1715 06/03/2019 1730 06/08/2019 1745  BP: 107/63 (!) 97/59 100/61 103/63  Pulse: (!) 118 (!) 115 (!) 105 (!) 102  Resp: (!) 22 (!) 22 18 (!) 22  SpO2: 98% 99% 98% 96%  Weight:      Height:       Eyes: PERRL, lids and conjunctivae normal ENMT: Mucous membranes are dry. Posterior pharynx clear of any exudate or lesions.Normal dentition.  Neck: normal, supple, no masses, no thyromegaly Respiratory: clear to auscultation bilaterally, no wheezing, no crackles. Normal respiratory effort. No accessory muscle use.  Cardiovascular: Regular rate and rhythm, no murmurs / rubs / gallops. No extremity edema. 2+ pedal pulses. No carotid bruits.  Abdomen: no tenderness, no masses palpated. No hepatosplenomegaly. Bowel sounds positive.  Musculoskeletal: no clubbing / cyanosis. No joint deformity upper and lower extremities. Good ROM, no contractures. Normal muscle tone.  Skin: no rashes, lesions, ulcers. No induration Neurologic: Oriented to himself, confused about time and place Psychiatric: Confused   Labs on Admission: I have personally reviewed following labs and imaging studies  CBC: Recent Labs  Lab 05/16/2019 1150  WBC 6.6  NEUTROABS 4.5  HGB 10.4*  HCT 33.4*  MCV 105.0*  PLT 672   Basic Metabolic Panel: Recent Labs  Lab 05/14/2019 1150  NA 139  K 5.0  CL  112*  CO2 16*  GLUCOSE 123*  BUN 102*  CREATININE 3.76*  CALCIUM 9.2   GFR: Estimated Creatinine Clearance: 11.6 mL/min (A) (by C-G formula based on SCr of 3.76 mg/dL (H)). Liver  Function Tests: Recent Labs  Lab 05/11/2019 1150  AST 19  ALT 18  ALKPHOS 64  BILITOT 0.5  PROT 5.7*  ALBUMIN 3.2*   Recent Labs  Lab 06/04/2019 1150  LIPASE 32   No results for input(s): AMMONIA in the last 168 hours. Coagulation Profile: No results for input(s): INR, PROTIME in the last 168 hours. Cardiac Enzymes: No results for input(s): CKTOTAL, CKMB, CKMBINDEX, TROPONINI in the last 168 hours. BNP (last 3 results) No results for input(s): PROBNP in the last 8760 hours. HbA1C: No results for input(s): HGBA1C in the last 72 hours. CBG: Recent Labs  Lab 05/31/2019 1118  GLUCAP 104*   Lipid Profile: No results for input(s): CHOL, HDL, LDLCALC, TRIG, CHOLHDL, LDLDIRECT in the last 72 hours. Thyroid Function Tests: Recent Labs    05/16/2019 1150  TSH 1.921   Anemia Panel: No results for input(s): VITAMINB12, FOLATE, FERRITIN, TIBC, IRON, RETICCTPCT in the last 72 hours. Urine analysis:    Component Value Date/Time   COLORURINE YELLOW 05/21/2019 1444   APPEARANCEUR HAZY (A) 05/31/2019 1444   LABSPEC 1.014 05/27/2019 1444   PHURINE 5.0 05/18/2019 1444   GLUCOSEU NEGATIVE 06/07/2019 1444   HGBUR MODERATE (A) 05/11/2019 1444   BILIRUBINUR NEGATIVE 05/14/2019 1444   KETONESUR NEGATIVE 06/08/2019 1444   PROTEINUR 100 (A) 05/30/2019 1444   NITRITE NEGATIVE 05/14/2019 1444   LEUKOCYTESUR SMALL (A) 05/16/2019 1444    Radiological Exams on Admission: DG Chest Port 1 View  Result Date: 06/05/2019 CLINICAL DATA:  Weakness. EXAM: PORTABLE CHEST 1 VIEW COMPARISON:  November 20, 2017. FINDINGS: Stable cardiomediastinal silhouette. No pneumothorax or pleural effusion is noted. Both lungs are clear. The visualized skeletal structures are unremarkable. IMPRESSION: No acute cardiopulmonary abnormality seen. Electronically Signed   By: Marijo Conception M.D.   On: 06/02/2019 11:53   US Abdomen Limited RUQ  Result Date: 05/14/2019 CLINICAL DATA:  Right upper quadrant pain EXAM:  ULTRASOUND ABDOMEN LIMITED RIGHT UPPER QUADRANT COMPARISON:  None. FINDINGS: Gallbladder: No gallstones or Pflum thickening visualized. No sonographic Murphy sign noted by sonographer. Common bile duct: Diameter: 2 mm Liver: No focal lesion identified. Within normal limits in parenchymal echogenicity. Portal vein is patent on color Doppler imaging with normal direction of blood flow towards the liver. Other: Right renal cortical thinning. IMPRESSION: No findings to account for reported symptoms. Electronically Signed   By: Macy Mis M.D.   On: 06/08/2019 12:32    EKG: Independently reviewed.   Assessment/Plan Active Problems:   AKI (acute kidney injury) ([place])  After mental status/acute metabolic encephalopathy  Likely related to dehydration and AKI. Hydrate patient, reevaluate in the a.m. Long discussion with patient's daughter in law at bedside, given what happened in the last few days of severe worsening of his conditions, will put 1:1 for safety  AKI Likely related to poor oral intake and diarrhea. If patient has further diarrhea episode, will consider sending C. difficile study 0.9% NS at 100 for tonight Renal U/S Hold colchicine  Non-anion gap metabolic acidosis From AKI, hydrate and recheck BMP in the morning  Paroxysmal A. Fib? ER physician gave 1 dose of beta-blocker, will repeat EKG, monitor looks like sinus tachycardia probably related to dehydration. As needed beta-blocker ASA and Plavix Overall poor candidate for systemic anticoagulation.  Deconditioning, generalized weakness Related to dehydration, hydrate and reevaluate, PT evaluation.  Dementia Advanced stage, reevaluate in the morning  DVT prophylaxis: Heparin subcu Code Status: DNR Family Communication: Daughter-in-law at bedside Disposition Plan: Follow-up is repeated renal function, if not improving may need nephrology consult.  Also follow-up PT evaluation Consults called: None Admission status:  Telemetry admission   Lequita Halt MD [facility] Pager [phone]    05/21/2019, 7:09 PM

## 2019-05-18 ENCOUNTER — Encounter (HOSPITAL_COMMUNITY): Payer: Self-pay | Admitting: Internal Medicine

## 2019-05-18 ENCOUNTER — Other Ambulatory Visit: Payer: Self-pay

## 2019-05-18 DIAGNOSIS — N179 Acute kidney failure, unspecified: Secondary | ICD-10-CM

## 2019-05-18 DIAGNOSIS — R778 Other specified abnormalities of plasma proteins: Secondary | ICD-10-CM

## 2019-05-18 HISTORY — DX: Acute kidney failure, unspecified: N17.9

## 2019-05-18 LAB — TYPE AND SCREEN
ABO/RH(D): O POS
Antibody Screen: NEGATIVE

## 2019-05-18 LAB — BASIC METABOLIC PANEL
Anion gap: 11 (ref 5–15)
BUN: 85 mg/dL — ABNORMAL HIGH (ref 8–23)
CO2: 13 mmol/L — ABNORMAL LOW (ref 22–32)
Calcium: 8.5 mg/dL — ABNORMAL LOW (ref 8.9–10.3)
Chloride: 116 mmol/L — ABNORMAL HIGH (ref 98–111)
Creatinine, Ser: 2.51 mg/dL — ABNORMAL HIGH (ref 0.61–1.24)
GFR calc Af Amer: 24 mL/min — ABNORMAL LOW (ref >60)
GFR calc non Af Amer: 21 mL/min — ABNORMAL LOW (ref >60)
Glucose, Bld: 119 mg/dL — ABNORMAL HIGH (ref 70–99)
Potassium: 4.7 mmol/L (ref 3.5–5.1)
Sodium: 140 mmol/L (ref 135–145)

## 2019-05-18 LAB — TROPONIN I (HIGH SENSITIVITY)
Troponin I (High Sensitivity): 10185 ng/L (ref <18)
Troponin I (High Sensitivity): 10884 ng/L (ref <18)

## 2019-05-18 LAB — OCCULT BLOOD X 1 CARD TO LAB, STOOL: Fecal Occult Bld: POSITIVE — AB

## 2019-05-18 LAB — HEMOGLOBIN AND HEMATOCRIT, BLOOD
HCT: 28.2 % — ABNORMAL LOW (ref 39.0–52.0)
HCT: 30.6 % — ABNORMAL LOW (ref 39.0–52.0)
Hemoglobin: 8.9 g/dL — ABNORMAL LOW (ref 13.0–17.0)
Hemoglobin: 10.1 g/dL — ABNORMAL LOW (ref 13.0–17.0)

## 2019-05-18 LAB — SARS CORONAVIRUS 2 (TAT 6-24 HRS): SARS Coronavirus 2: NEGATIVE

## 2019-05-18 MED ORDER — STERILE WATER FOR INJECTION IV SOLN
INTRAVENOUS | Status: DC
  Filled 2019-05-18 (×3): qty 850

## 2019-05-18 MED ORDER — SODIUM CHLORIDE 0.9 % IV BOLUS
500.0000 mL | Freq: Once | INTRAVENOUS | Status: AC
  Administered 2019-05-18: 500 mL via INTRAVENOUS

## 2019-05-18 MED ORDER — STERILE WATER FOR INJECTION IV SOLN
INTRAVENOUS | Status: AC
  Filled 2019-05-18 (×2): qty 850

## 2019-05-18 MED ORDER — METOPROLOL TARTRATE 12.5 MG HALF TABLET
12.5000 mg | ORAL_TABLET | Freq: Two times a day (BID) | ORAL | Status: DC
  Administered 2019-05-18: 12.5 mg via ORAL
  Filled 2019-05-18 (×2): qty 1

## 2019-05-18 MED ORDER — PANTOPRAZOLE SODIUM 40 MG IV SOLR
40.0000 mg | Freq: Two times a day (BID) | INTRAVENOUS | Status: DC
  Administered 2019-05-18 – 2019-05-19 (×3): 40 mg via INTRAVENOUS
  Filled 2019-05-18 (×3): qty 40

## 2019-05-18 MED ORDER — FUROSEMIDE 40 MG PO TABS
40.0000 mg | ORAL_TABLET | Freq: Two times a day (BID) | ORAL | Status: DC
  Administered 2019-05-19: 40 mg via ORAL
  Filled 2019-05-18: qty 1

## 2019-05-18 NOTE — Progress Notes (Signed)
CRITICAL VALUE ALERT  Critical Value:  Troponin 10,185  Date & Time Notied:  05/18/2019 @ 5859  Provider Notified: Opyd,MD 05/18/2019 @ 0557  Orders Received/Actions taken: Orders carried out.Will continue to monitor.

## 2019-05-18 NOTE — Progress Notes (Signed)
Patient c/o left chest pain, states "like a heart attack,I had it before." When asked how much pain from a scale of 0-10,states "as bad as it can get". Opyd,MD text paged.Caryl Fate, Wonda Cheng, Therapist, sports

## 2019-05-18 NOTE — Consult Note (Signed)
CARDIOLOGY CONSULT NOTE  Patient ID: [name] MRN: [id number] DOB/AGE: 1923/09/29 86 y.o.  Admit date: 05/24/2019 Referring Physician  Antonieta Pert, MD Primary Physician:  Burnard Bunting, MD Reason for Consultation  NSTEMI  Patient ID: [name], male    DOB: 11/28/1923, 86 y.o.   MRN: [id number]  Chief Complaint  Patient presents with  . Weakness  . Hypotension   HPI:    Justin [name]  is a 86 y.o. Caucasian male with history of coronary artery disease and non-STEMI in September 2019 when he was found to have distal left main 95% stenosis and complex LAD high-grade 90 to 95% proximal stenosis and circumflex 70% stenosis, peripheral artery disease, abdominal embolism SP endovascular repair in April 2018, hypertension, hyperlipidemia, stage III chronic kidney disease, who is now admitted to the hospital on 05/25/2019 with generalized weakness, worsening dementia and dehydration and acute renal failure along with hypertension.  He was fluid resuscitated, and in view of his history of cardiac issues, cardiac troponins and EKG obtained which was markedly abnormal.  I was consulted to opine regarding further management of the same.  She also has chronic chest pain syndrome and daughter had mentioned that he has been complaining of chest discomfort almost on a daily basis.  In the interim he was found to have guaiac positive stool and anemia. This afternoon when I saw the patient, he denied any chest pain, states that he feels generally slightly weak but otherwise no specific complaints.  Daughter is also present at the bedside.  Past Medical History:  Diagnosis Date  . AAA (abdominal aortic aneurysm) ([place])    "we've known about it since ~ 2000"  . AKI (acute kidney injury) ([place]) 05/18/2019  . Arthritis    in back and hands:  Gout  . Atrial fibrillation ([place])    a. 04/2011 post op  . BPH (benign prostatic hyperplasia)   . Bronchitis    hx of; "once or twice in my life" (04/21/11)  .  Cancer ([place])    Basal Cell carcinoma- per Dr Jacquiline Doe office notes  . Carotid artery disease ([place])   . Chronic kidney disease    stage 3 - per Dr Jacquiline Doe office notes  . Chronic lower back pain 1995  . Eczema   . GERD (gastroesophageal reflux disease)   . Gout of big toe    "h/o in both"  . Hiatal hernia   . History of kidney stones   . Hypertension   . Lumbar spondylosis   . Lumbar stenosis    a. s/p  anterolateral decompression and lateral plating 04/21/2011  . Migraine    "left ~ 1980's"  . Stomach ulcer    "years ago; treated w/diet"    Past Surgical History:  Procedure Laterality Date  . ABDOMINAL AORTA STENT  06/03/2016   ABDOMINAL AORTIC ENDOVASCULAR STENT GRAFT insertion (N/A)  . ABDOMINAL AORTIC ENDOVASCULAR STENT GRAFT N/A 06/03/2016   Procedure: ABDOMINAL AORTIC ENDOVASCULAR STENT GRAFT insertion;  Surgeon: Serafina Mitchell, MD;  Location: [facility];  Service: Vascular;  Laterality: N/A;  . ANTERIOR LAT LUMBAR FUSION  04/21/11   w/PEEK cage, allograft, and lateral plate (r)  . ANTERIOR LAT LUMBAR FUSION  04/21/2011   Procedure: ANTERIOR LATERAL LUMBAR FUSION 1 LEVEL;  Surgeon: Erline Levine, MD;  Location: [facility];  Service: Neurosurgery;  Laterality: Right;  RIGHT Lumbar three-four anterolateral decompression with fusion and lateral plating  . APPENDECTOMY  1946  . BACK SURGERY  04/21/11   "today makes the 5th time"  . CATARACT EXTRACTION, BILATERAL    . EYE SURGERY Bilateral    Cataract  . LEFT HEART CATH AND CORONARY ANGIOGRAPHY N/A 11/22/2017   Procedure: LEFT HEART CATH AND CORONARY ANGIOGRAPHY;  Surgeon: Nigel Mormon, MD;  Location: [facility];  Service: Cardiovascular;  Laterality: N/A;  . PROSTATE SURGERY    . SPINE SURGERY     X's 5   . TONSILLECTOMY     "as a child" and Adneoids   Social History   Socioeconomic History  . Marital status: Married    Spouse name: Not on file  . Number of children: 2  . Years of education: Not on file   . Highest education level: Not on file  Occupational History  . Not on file  Tobacco Use  . Smoking status: Former Smoker    Packs/day: 1.00    Years: 20.00    Pack years: 20.00    Types: Cigarettes    Quit date: 02/10/1980    Years since quitting: 39.2  . Smokeless tobacco: Never Used  Substance and Sexual Activity  . Alcohol use: No  . Drug use: No  . Sexual activity: Yes  Other Topics Concern  . Not on file  Social History Narrative   Lives in [place] his wife.  Retired.  Activity limited by back pain.   Social Determinants of Health   Financial Resource Strain:   . Difficulty of Paying Living Expenses:   Food Insecurity:   . Worried About Charity fundraiser in the Last Year:   . Arboriculturist in the Last Year:   Transportation Needs:   . Film/video editor (Medical):   Marland Kitchen Lack of Transportation (Non-Medical):   Physical Activity:   . Days of Exercise per Week:   . Minutes of Exercise per Session:   Stress:   . Feeling of Stress :   Social Connections:   . Frequency of Communication with Friends and Family:   . Frequency of Social Gatherings with Friends and Family:   . Attends Religious Services:   . Active Member of Clubs or Organizations:   . Attends Archivist Meetings:   Marland Kitchen Marital Status:   Intimate Partner Violence:   . Fear of Current or Ex-Partner:   . Emotionally Abused:   Marland Kitchen Physically Abused:   . Sexually Abused:    ROS  Review of Systems  Constitution: Positive for malaise/fatigue.  Cardiovascular: Positive for dyspnea on exertion. Negative for chest pain and leg swelling.  Gastrointestinal: Negative for melena.  Neurological: Positive for dizziness.   Objective   Vitals with BMI 05/18/2019 05/18/2019 05/18/2019  Height - - -  Weight - - -  BMI - - -  Systolic - 355 974  Diastolic - 65 62  Pulse 163 113 98    Blood pressure 109/65, pulse (!) 106, temperature 97.8 F (36.6 C), temperature source Oral, resp. rate 18,  height 5' 10"  (1.778 m), weight 68 kg, SpO2 98 %.    Physical Exam  Constitutional: He is oriented to person, place, and time. He appears well-developed and well-nourished. No distress.  Cardiovascular: Normal rate, regular rhythm, normal heart sounds and intact distal pulses. Exam reveals no gallop.  No murmur heard. Pulses:      Carotid pulses are on the right side with bruit and on the left side with bruit.      Radial pulses are  2+ on the right side and 2+ on the left side.       Dorsalis pedis pulses are 0 on the right side and 0 on the left side.       Posterior tibial pulses are 0 on the right side and 0 on the left side.  No leg edema, no JVD.  Pulmonary/Chest: Effort normal. He has rales (bilateral faint crackles).  Abdominal: Soft. Bowel sounds are normal.  Musculoskeletal:        General: No edema.  Neurological: He is alert and oriented to person, place, and time.  Skin: Skin is warm and dry.   Laboratory examination:   Recent Labs    05/28/2019 1150 05/18/19 0445  NA 139 140  K 5.0 4.7  CL 112* 116*  CO2 16* 13*  GLUCOSE 123* 119*  BUN 102* 85*  CREATININE 3.76* 2.51*  CALCIUM 9.2 8.5*  GFRNONAA 13* 21*  GFRAA 15* 24*   estimated creatinine clearance is 17.3 mL/min (A) (by C-G formula based on SCr of 2.51 mg/dL (H)).  CMP Latest Ref Rng & Units 05/18/2019 05/26/2019 03/15/2018  Glucose 70 - 99 mg/dL 119(H) 123(H) 153(H)  BUN 8 - 23 mg/dL 85(H) 102(H) 20  Creatinine 0.61 - 1.24 mg/dL 2.51(H) 3.76(H) 1.26(H)  Sodium 135 - 145 mmol/L 140 139 144  Potassium 3.5 - 5.1 mmol/L 4.7 5.0 4.0  Chloride 98 - 111 mmol/L 116(H) 112(H) 111  CO2 22 - 32 mmol/L 13(L) 16(L) 22  Calcium 8.9 - 10.3 mg/dL 8.5(L) 9.2 9.6  Total Protein 6.5 - 8.1 g/dL - 5.7(L) -  Total Bilirubin 0.3 - 1.2 mg/dL - 0.5 -  Alkaline Phos 38 - 126 U/L - 64 -  AST 15 - 41 U/L - 19 -  ALT 0 - 44 U/L - 18 -   CBC Latest Ref Rng & Units 05/18/2019 05/18/2019 05/11/2019  WBC 4.0 - 10.5 K/uL - - 6.6  Hemoglobin  13.0 - 17.0 g/dL 10.1(L) 8.9(L) 10.4(L)  Hematocrit 39.0 - 52.0 % 30.6(L) 28.2(L) 33.4(L)  Platelets 150 - 400 K/uL - - 210   Lipid Panel     Component Value Date/Time   CHOL 180 11/21/2017 0652   TRIG 148 11/21/2017 0652   HDL 38 (L) 11/21/2017 0652   CHOLHDL 4.7 11/21/2017 0652   VLDL 30 11/21/2017 0652   LDLCALC 112 (H) 11/21/2017 0652   HEMOGLOBIN A1C Lab Results  Component Value Date   HGBA1C 6.0 (H) 11/21/2017   MPG 125.5 11/21/2017   TSH Recent Labs    05/28/2019 1150  TSH 1.921   BNP (last 3 results) No results for input(s): BNP in the last 8760 hours.  Medications and allergies   Allergies  Allergen Reactions  . Demerol Anaphylaxis  . Shellfish Allergy     Causes gout to be worse. / brings it on.      .  sodium bicarbonate (isotonic) infusion in sterile water 110 mL/hr at 05/18/19 1718    Current Outpatient Medications  Medication Instructions  . acetaminophen (TYLENOL) 1,000 mg, Oral, Daily PRN  . aspirin EC 81 mg, Oral, Daily  . brimonidine (ALPHAGAN) 0.2 % ophthalmic solution 1 drop, Both Eyes, 2 times daily  . clopidogrel (PLAVIX) 75 mg, Oral, Daily  . Colcrys 0.6 mg, Oral, Daily  . donepezil (ARICEPT) 5 mg, Oral, Daily  . dorzolamide-timolol (COSOPT) 22.3-6.8 MG/ML ophthalmic solution 1 drop, Both Eyes, 2 times daily  . dutasteride (AVODART) 0.5 mg, Oral, Daily, W,F  . FEROSUL 325 (  65 Fe) MG tablet TAKE 1 TABLET BY MOUTH 2 TIMES DAILY  . furosemide (LASIX) 20 MG tablet TAKE 1 TABLET BY MOUTH DAILY. (TAKE in ADDITION TO FUROSEMIDE 20 MG EVERY OTHER DAY)  . furosemide (LASIX) 20 mg, Oral, 3 times weekly, M,W,F  . lisinopril (ZESTRIL) 20 MG tablet TAKE 1 TABLET BY MOUTH EVERY DAY  . metoprolol succinate (TOPROL-XL) 25 MG 24 hr tablet TAKE 1 TABLET BY MOUTH EVERY DAY  . Multiple Vitamin (MULTIVITAMIN) capsule 1 capsule, Oral, Daily  . Multiple Vitamins-Minerals (SYSTANE ICAPS AREDS2) TABS 1 capsule, Oral, Daily  . nitroGLYCERIN (NITROSTAT) 0.4 MG SL  tablet PLACE ONE TABLET UNDER TONGUE AS NEEDED FOR CHEST PAIN EVERY 5 MINUTES  . potassium chloride (K-DUR) 10 MEQ tablet 10 mEq, Oral, Daily  . ranolazine (RANEXA) 500 MG 12 hr tablet TAKE 1 TABLET BY MOUTH 2 TIMES DAILY  . rosuvastatin (CRESTOR) 10 MG tablet TAKE 1 TABLET BY MOUTH DAILY AT 6 PM  . Travoprost, BAK Free, (TRAVATAN Z) 0.004 % SOLN ophthalmic solution 1 drop, Left Eye, Every other day  . vitamin C (ASCORBIC ACID) 500 mg, Oral, Daily   Scheduled Meds: . vitamin C  500 mg Oral Daily  . aspirin EC  81 mg Oral Daily  . brimonidine  1 drop Both Eyes Q8H  . donepezil  5 mg Oral Daily  . dorzolamide-timolol  1 drop Both Eyes BID  . dutasteride  0.5 mg Oral Daily  . ferrous sulfate  325 mg Oral BID WC  . [START ON 23-May-2019] furosemide  40 mg Oral BID  . latanoprost  1 drop Left Eye QHS  . metoprolol tartrate  12.5 mg Oral BID  . multivitamin with minerals  1 tablet Oral Daily  . pantoprazole (PROTONIX) IV  40 mg Intravenous Q12H  . ranolazine  500 mg Oral BID  . rosuvastatin  10 mg Oral QPM   Continuous Infusions: .  sodium bicarbonate (isotonic) infusion in sterile water 110 mL/hr at 05/18/19 1718   PRN Meds:.acetaminophen, haloperidol lactate, labetalol, metoprolol tartrate, nitroGLYCERIN, ondansetron (ZOFRAN) IV  I/O last 3 completed shifts: In: 3085.8 [P.O.:600; I.V.:1225.7; IV Piggyback:1260.1] Out: 402 [Urine:400; Stool:2] No intake/output data recorded.    Radiology:   Imaging: US RENAL  Result Date: 05/18/2019 CLINICAL DATA:  Acute kidney injury. EXAM: RENAL / URINARY TRACT ULTRASOUND COMPLETE COMPARISON:  CT dated Jul 09, 2016. FINDINGS: Right Kidney: Renal measurements: 10.2 x 4.8 x 3.5 cm = volume: 87.6 mL. There is increased cortical echogenicity. There are small cysts noted measuring up to approximately 2.4 cm. There is no hydronephrosis. Left Kidney: Renal measurements: 9.5 x 5.4 x 3.6 cm = volume: 97 mL. The renal cortex is echogenic. There is no  hydronephrosis. There is a small cyst measuring approximately 1.3 cm. Bladder: The ureteral jets were not visualized. Other: None. IMPRESSION: 1. No acute abnormality. 2. Echogenic kidneys bilaterally which can be seen in patients with medical renal disease. 3. Simple appearing bilateral renal cysts, for which no further follow-up is required. Electronically Signed   By: Constance Holster M.D.   On: 05/24/2019 21:23   DG Chest Port 1 View  Result Date: 05/31/2019 CLINICAL DATA:  Weakness. EXAM: PORTABLE CHEST 1 VIEW COMPARISON:  November 20, 2017. FINDINGS: Stable cardiomediastinal silhouette. No pneumothorax or pleural effusion is noted. Both lungs are clear. The visualized skeletal structures are unremarkable. IMPRESSION: No acute cardiopulmonary abnormality seen. Electronically Signed   By: Marijo Conception M.D.   On: 05/16/2019 11:53  US Abdomen Limited RUQ  Result Date: 05/14/2019 CLINICAL DATA:  Right upper quadrant pain EXAM: ULTRASOUND ABDOMEN LIMITED RIGHT UPPER QUADRANT COMPARISON:  None. FINDINGS: Gallbladder: No gallstones or Lall thickening visualized. No sonographic Murphy sign noted by sonographer. Common bile duct: Diameter: 2 mm Liver: No focal lesion identified. Within normal limits in parenchymal echogenicity. Portal vein is patent on color Doppler imaging with normal direction of blood flow towards the liver. Other: Right renal cortical thinning. IMPRESSION: No findings to account for reported symptoms. Electronically Signed   By: Macy Mis M.D.   On: 06/05/2019 12:32   Cardiac Studies:   Coronary angiography 11/22/2017: LM: Distal LM bifurcation has severely calcified 95% stenosis LAD: Large diag 1 with ostial 95% stenosis LAD/Diag 2 bifurcation 90% stenosis. Mid diag 2 60% diserase Rest of the LAD looks fairly normal with good surgical targets on LAD as well as diag vessels LCx: Prox LCx tortuous with 70% disease Moderate sized OM 1 with long 95%  stenosis with good surgical targets  RCA: Prox-mid RCA 50% irregular disease  Normal LVEDP  Severely multivessel CAD  Echocardiogram 11/21/2017: - Left ventricle: The cavity size was normal. There was moderate concentric hypertrophy. Systolic function was normal. The estimated ejection fraction was in the range of 55% to 60%. Freas motion was normal; there were no regional Kaupp motion abnormalities. Doppler parameters are consistent with abnormal left ventricular relaxation (grade 1 diastolic dysfunction). - Aortic valve: Moderately calcified annulus. Trileaflet; mildly calcified leaflets. There was no significant regurgitation. Inadequate Doppler evaluation to assess stenosis. Visually, does not appear significant. - Mitral valve: The findings are consistent with mild calcific stenosis. mean PG 4 mmHg, MVA 1.8 cm2 at HR 74 bpm. Valve area bypressure half-time: 1.86 cm^2. - Tricuspid valve: There was mild regurgitation. Pulmonary arteries: PA peak pressure: 29 mm Hg (S).   Aorta duplex 07/2018: Abdominal Aorta: Previous diameter measurement was 5.4 cm obtained on 07/20/2017. The largest diamater of the sac size is 5.09 x 4.46.  EKG: EKG 05/18/2019: Normal sinus rhythm at the rate of 98 bpm, left axis deviation, ST segment depression in inferior and anterolateral leads suggestive of ischemia versus subendocardial infarct.  ST elevation in aVR.  Abnormal EKG.  Compared to yesterday's EKG, ST depressions more pronounced.  Assessment   Justin [name]  is a 86 y.o. Caucasian male with history of coronary artery disease and non-STEMI in September 2019 when he was found to have distal left main 95% stenosis and complex LAD high-grade 90 to 95% proximal stenosis and circumflex 70% stenosis, peripheral artery disease, abdominal embolism SP endovascular repair in April 2018, hypertension, hyperlipidemia, stage III chronic kidney disease, who is now admitted to the hospital on 06/07/2019 with  generalized weakness, worsening dementia and dehydration and acute renal failure along with hypertension.  He was fluid resuscitated, and in view of his history of cardiac issues, cardiac troponins and EKG obtained which was markedly abnormal.  I was consulted to opine regarding further management of the same.  1.  Non-STEMI 2.  Coronary artery disease of the native vessel with unstable angina 3.  Guaiac positive stool and blood loss anemia 4.  Acute on chronic kidney disease stage IV 5.  Peripheral arterial disease and history of AAA repair  Recommendations:   Although patient 86 years of age, did do physical therapy today and walked the hallway.  Would recommend restarting: Low-dose beta-blocker, blood pressure is stable enough to start with metoprolol 12.5 mg p.o. twice daily.  Obtain  an echocardiogram.  Discontinue Plavix, agree with the decision making and if hemoglobin continues to drift down, we could also discontinue aspirin although has known coronary disease.  Goals of therapy would be palliative care.  I met with his 2 sons and explained that although patient has multiple medical comorbidity, I do not think he is at end-of-life.  Conservative therapy with medical management of CAD would be appropriate.  Also evaluate him for home oxygen needs.  Discontinue lisinopril due to acute renal failure.  Continue furosemide.   Adrian Prows, MD, [facility] 05/18/2019, 9:46 PM [facility] [place] Pager: [phone] Office: [phone]

## 2019-05-18 NOTE — Evaluation (Signed)
Physical Therapy Evaluation Patient Details Name: Justin [name] MRN: [id number] DOB: Sep 02, 1923 Today's Date: 05/18/2019   History of Present Illness  Pt is a 86 y/o male admitted secondary to weakness and worsening confusion. Pt found to have an AKI and a NSTEMI. Additionally pt with possible GIB. PMH including but not limited to advanced dementia, paroxysmal atrial fibrillation, AAA, chronic kidney disease, carotid artery disease and PAD.    Clinical Impression  Pt presented supine in bed with HOB elevated, awake and willing to participate in therapy session. Prior to admission, pt reported that he was independent with ADLs and recently began using a RW to ambulate. His two sons were present throughout session as well. At the time of evaluation, pt overall at a min guard level with all mobility. He participated in gait training in hallway with use of RW and min guard for safety. HR was stable in the low 100's throughout. Pt also without any reports of chest pain throughout. Pt would continue to benefit from skilled physical therapy services at this time while admitted and after d/c to address the below listed limitations in order to improve overall safety and independence with functional mobility.     Follow Up Recommendations Home health PT;Supervision/Assistance - 24 hour    Equipment Recommendations  None recommended by PT    Recommendations for Other Services       Precautions / Restrictions Precautions Precautions: Fall Precaution Comments: monitor HR and reports of any chest pain Restrictions Weight Bearing Restrictions: No      Mobility  Bed Mobility Overal bed mobility: Needs Assistance Bed Mobility: Supine to Sit;Sit to Supine     Supine to sit: Min guard Sit to supine: Min guard   General bed mobility comments: increased time and effort, HOB slightly elevated  Transfers Overall transfer level: Needs assistance Equipment used: Rolling walker (2  wheeled) Transfers: Sit to/from Stand Sit to Stand: Min guard         General transfer comment: cueing for safe hand placement, steady with transition into standing from EOB  Ambulation/Gait Ambulation/Gait assistance: Min guard Gait Distance (Feet): 50 Feet Assistive device: Rolling walker (2 wheeled) Gait Pattern/deviations: Step-through pattern;Decreased stride length;Trunk flexed Gait velocity: decreased   General Gait Details: pt with mild instability but no overt LOB or need for physical assistance, min guard for safety; occasional cueing to maintaing proximity to RW and for more upright posture  Stairs            Wheelchair Mobility    Modified Rankin (Stroke Patients Only)       Balance Overall balance assessment: Needs assistance Sitting-balance support: Feet supported Sitting balance-Leahy Scale: Fair     Standing balance support: Bilateral upper extremity supported Standing balance-Leahy Scale: Poor                               Pertinent Vitals/Pain Pain Assessment: No/denies pain    Home Living Family/patient expects to be discharged to:: Private residence Living Arrangements: Spouse/significant other Available Help at Discharge: Family;Available 24 hours/day Type of Home: House Home Access: Stairs to enter Entrance Stairs-Rails: Psychiatric nurse of Steps: 2 Home Layout: One level Home Equipment: Walker - 2 wheels      Prior Function Level of Independence: Independent with assistive device(s)         Comments: pt's sons reporting that he recently began ambulating with use of a RW (with their encouragement)  Hand Dominance        Extremity/Trunk Assessment   Upper Extremity Assessment Upper Extremity Assessment: Generalized weakness    Lower Extremity Assessment Lower Extremity Assessment: Generalized weakness    Cervical / Trunk Assessment Cervical / Trunk Assessment: Kyphotic   Communication   Communication: HOH  Cognition Arousal/Alertness: Awake/alert Behavior During Therapy: WFL for tasks assessed/performed Overall Cognitive Status: Within Functional Limits for tasks assessed                                 General Comments: cognition not formally assessed but [facility] for general conversation. Pt jokes frequently, very friendly and agreeable.      General Comments      Exercises     Assessment/Plan    PT Assessment Patient needs continued PT services  PT Problem List Decreased strength;Decreased balance;Decreased mobility;Decreased coordination;Decreased knowledge of use of DME;Decreased safety awareness;Decreased knowledge of precautions       PT Treatment Interventions DME instruction;Gait training;Stair training;Functional mobility training;Therapeutic activities;Therapeutic exercise;Balance training;Neuromuscular re-education;Patient/family education    PT Goals (Current goals can be found in the Care Plan section)  Acute Rehab PT Goals Patient Stated Goal: to go home soon PT Goal Formulation: With patient/family Time For Goal Achievement: 06/01/19 Potential to Achieve Goals: Fair    Frequency Min 3X/week   Barriers to discharge        Co-evaluation               AM-PAC PT "6 Clicks" Mobility  Outcome Measure Help needed turning from your back to your side while in a flat bed without using bedrails?: None Help needed moving from lying on your back to sitting on the side of a flat bed without using bedrails?: None Help needed moving to and from a bed to a chair (including a wheelchair)?: A Little Help needed standing up from a chair using your arms (e.g., wheelchair or bedside chair)?: A Little Help needed to walk in hospital room?: A Little Help needed climbing 3-5 steps with a railing? : A Lot 6 Click Score: 19    End of Session Equipment Utilized During Treatment: Gait belt Activity Tolerance: Patient tolerated  treatment well Patient left: in bed;with call bell/phone within reach;with family/visitor present Nurse Communication: Mobility status PT Visit Diagnosis: Other abnormalities of gait and mobility (R26.89);Muscle weakness (generalized) (M62.81)    Time: 1610-9604 PT Time Calculation (min) (ACUTE ONLY): 31 min   Charges:   PT Evaluation $PT Eval Moderate Complexity: 1 Mod PT Treatments $Gait Training: 8-22 mins        Anastasio Champion, DPT  Acute Rehabilitation Services Pager [phone] Office [place] 05/18/2019, 1:27 PM

## 2019-05-18 NOTE — Consult Note (Signed)
Consultation Note Date: 05/18/2019   Patient Name: Justin [name]  DOB: 1923/05/24  MRN: [id number]  Age / Sex: 86 y.o., male  PCP: Justin Bunting, MD Referring Physician: Antonieta Pert, MD  Reason for Consultation: Establishing goals of care  HPI/Patient Profile: 86 y.o. male  with past medical history of PUD, CKD 3, severe CAD, atrial fibrillation, AAA, lumbar stenosis and spondylosis, PAD and advanced dementia who was admitted on 05/18/2019 with weakness and increased confusion.  His oral intake had been decreased for two weeks since his wife (primary care taker) was hospitalized with a fractured wrist.  He had black tarry stool and his creatinine had risen to 3.76.  Troponin was > 10,000, hgb was down and blood pressures were soft.  He was evaluated by Cardiology (Dr. Einar [name]) who recommended medical management and no invasive procedures.   Clinical Assessment and Goals of Care:  I have reviewed medical records including EPIC notes, labs and imaging, received report from the care team, examined the patient and met at bedside with the patient's son Justin [name] to discuss diagnosis prognosis, Justin [name], EOL wishes, disposition and options.  I introduced Palliative Medicine as specialized medical care for people living with serious illness. It focuses on providing relief from the symptoms and stress of a serious illness.   We discussed a brief life review of the patient. Justin [name] is a [employer] vet who served in [facility] [name] when he was 26.  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  He has 3 children with his wife Justin [name].  Justin [name] is his primary care taker as he has developed dementia.  Even though she is 89, her son Justin [name] describes her as acting like a 86 year old.   Justin [name] is Justin [name] default HCPOA.  Unfortunately Justin [name] is having surgery  on her arm on Wednesday - additional help will be needed in the house.  As far as functional and nutritional status Justin [name] did relatively well with PT yesterday walking approximately 50'.  His PO intake prior to admission was poor and he was dehydrated.  In the hospital he received IV fluids and now in my presence is eating 1/2 a sandwich.  We discussed his current illness and what it means in the larger context of his on-going co-morbidities.  Natural disease trajectory and expectations at EOL were discussed.  I attempted to elicit values and goals of care important to the patient.  The difference between aggressive medical intervention and comfort care was considered in light of the patient's goals of care.  Advanced directives, concepts specific to code status, artifical feeding and hydration, and rehospitalization were considered and discussed.  Hospice and Palliative Care [name] outpatient were explained and offered.  Justin [name] explains that the hospital has set him up with Justin [name].  He is interested in his father continuing with PT.  He feels that his father is not ready for hospice yet as he  is not at EOL.  I explained that Hospice in the home is longer term - months.    Questions and concerns were addressed.  I gave Justin [name] my contact information and encouraged him to call if he needed any additional information.   Primary Decision Maker:  NEXT OF KIN Wife Justin    SUMMARY OF RECOMMENDATIONS     PMT will continue to follow up with the family and contact Justin [name] directly in addition to her son Justin [name].  Currently family wants Palliative and home health.   I'm concerned that Justin [name] will dehydrate again within weeks when IVF are discontinued.  He does not appear to be eating enough to sustain himself.  His recent decline is likely to continue particularly as he continues to have intermittent tachycardia and chest pain.  Code Status/Advance Care  Planning:  DNR   Symptom Management:   SL Nitro for chest pain as ordered.  Additional Recommendations (Limitations, Scope, Preferences):  Full Scope Treatment  Palliative Prophylaxis:   Frequent Pain Assessment  Psycho-social/Spiritual:   Desire for further Chaplaincy support: welcomed.  Prognosis:  Guarded.  Patient with intermittent on-going chest pain and tachycardia.  At this point he is not eating enough to sustain himself long term and will likely become dehydrated again once IV fluids are discontinued.    Discharge Planning: Home with Palliative [name] and home health.      Primary Diagnoses: Present on Admission: **None**   I have reviewed the medical record, interviewed the patient and family, and examined the patient. The following aspects are pertinent.  Past Medical History:  Diagnosis Date  . AAA (abdominal aortic aneurysm) ([place])    "we've known about it since ~ 2000"  . AKI (acute kidney injury) ([place]) 05/18/2019  . Arthritis    in back and hands:  Gout  . Atrial fibrillation ([place])    a. 04/2011 post op  . BPH (benign prostatic hyperplasia)   . Bronchitis    hx of; "once or twice in my life" (04/21/11)  . Cancer ([place])    Basal Cell carcinoma- per Dr Jacquiline Doe office notes  . Carotid artery disease ([place])   . Chronic kidney disease    stage 3 - per Dr Jacquiline Doe office notes  . Chronic lower back pain 1995  . Eczema   . GERD (gastroesophageal reflux disease)   . Gout of big toe    "h/o in both"  . Hiatal hernia   . History of kidney stones   . Hypertension   . Lumbar spondylosis   . Lumbar stenosis    a. s/p  anterolateral decompression and lateral plating 04/21/2011  . Migraine    "left ~ 1980's"  . Stomach ulcer    "years ago; treated w/diet"    Social History   Socioeconomic History  . Marital status: Married    Spouse name: Not on file  . Number of children: 2  . Years of education: Not on file  . Highest education level: Not  on file  Occupational History  . Not on file  Tobacco Use  . Smoking status: Former Smoker    Packs/day: 1.00    Years: 20.00    Pack years: 20.00    Types: Cigarettes    Quit date: 02/10/1980    Years since quitting: 39.2  . Smokeless tobacco: Never Used  Substance and Sexual Activity  . Alcohol use: No  . Drug use: No  . Sexual activity: Yes  Other Topics Concern  .  Not on file  Social History Narrative   [place]ives in [place].  Retired.  Activity limited by back pain.   Social Determinants of Health   Financial Resource Strain:   . Difficulty of Paying Living Expenses:   Food Insecurity:   . Worried About Charity fundraiser in the Last Year:   . Arboriculturist in the Last Year:   Transportation Needs:   . Film/video editor (Medical):   Marland Kitchen Lack of Transportation (Non-Medical):   Physical Activity:   . Days of Exercise per Week:   . Minutes of Exercise per Session:   Stress:   . Feeling of Stress :   Social Connections:   . Frequency of Communication with Friends and Family:   . Frequency of Social Gatherings with Friends and Family:   . Attends Religious [name]:   . Active Member of Clubs or Organizations:   . Attends Archivist Meetings:   Marland Kitchen Marital Status:    Family History  Problem Relation Age of Onset  . Alzheimer's disease Mother        died @ 19  . COPD Father        died @ 12  . Cancer Father   . Hypertension Father   . Heart disease Father   . Stroke Sister   . Cancer Brother        stomach  . Heart disease Brother        Aneurysm  . Hypertension Son   . Heart disease Son        Aneurysm  . Hypertension Son   . Heart disease Son        Aneurysm  . Anesthesia problems Neg Hx   . Hypotension Neg Hx   . Malignant hyperthermia Neg Hx   . Pseudochol deficiency Neg Hx    Scheduled Meds: . vitamin C  500 mg Oral Daily  . aspirin EC  81 mg Oral Daily  . brimonidine  1 drop Both Eyes Q8H  . donepezil  5 mg Oral  Daily  . dorzolamide-timolol  1 drop Both Eyes BID  . dutasteride  0.5 mg Oral Daily  . ferrous sulfate  325 mg Oral BID WC  . latanoprost  1 drop Left Eye QHS  . metoprolol tartrate  12.5 mg Oral BID  . multivitamin with minerals  1 tablet Oral Daily  . pantoprazole (PROTONIX) IV  40 mg Intravenous Q12H  . ranolazine  500 mg Oral BID  . rosuvastatin  10 mg Oral QPM   Continuous Infusions: .  sodium bicarbonate (isotonic) infusion in sterile water 110 mL/hr at 05/18/19 1718   PRN Meds:.acetaminophen, haloperidol lactate, labetalol, metoprolol tartrate, nitroGLYCERIN, ondansetron (ZOFRAN) IV Allergies  Allergen Reactions  . Demerol Anaphylaxis  . Shellfish Allergy     Causes gout to be worse. / brings it on.     Review of Systems  Patient pleasantly demented.   Physical Exam  Elderly frail gentleman, awake, alert, HOH CV tachy resp no distress  Vital Signs: BP 109/65 (BP Location: Right Arm)   Pulse (!) 106   Temp 97.8 F (36.6 C) (Oral)   Resp 18   Ht '5\' 10"'$  (1.778 m)   Wt 68 kg   SpO2 98%   BMI 21.52 kg/m  Pain Scale: 0-10   Pain Score: 0-No pain   SpO2: SpO2: 98 % O2 Device:SpO2: 98 % O2 Flow Rate: .   IO: Intake/output summary:  Intake/Output Summary (Last 24 hours) at 05/18/2019 2106 Last data filed at 05/18/2019 1726 Gross per 24 hour  Intake 2585.83 ml  Output 402 ml  Net 2183.83 ml    LBM:   Baseline Weight: Weight: 68 kg Most recent weight: Weight: 68 kg     Palliative Assessment/Data: 40%     Time In: 5:00 Time Out: 6:12 Time Total: 72 min Visit consisted of counseling and education dealing with the complex and emotionally intense issues surrounding the need for palliative care and symptom management in the setting of serious and potentially life-threatening illness. Greater than 50%  of this time was spent counseling and coordinating care related to the above assessment and plan.  Signed by: Florentina Jenny, PA-C Palliative  Medicine  Please contact Palliative Medicine Team phone at [phone] for questions and concerns.  For individual provider: See Shea Evans

## 2019-05-18 NOTE — Progress Notes (Signed)
Patient's BP 81/46 HR 84, manual recheck 84/46. MD on call text paged. Order received. Will continue to monitor. Susette Seminara, Wonda Cheng, Therapist, sports

## 2019-05-18 NOTE — Progress Notes (Signed)
Patient  chest pain free after 1 dose of nitro SL. BP 80/51 HR 83. Patient also had dark tarry stool. MD,Opyd text paged. Order received for 500 cc NS bolus and FOBT,H&H. Will continue to monitor patient. Fiore Detjen, Wonda Cheng, Therapist, sports

## 2019-05-18 NOTE — TOC Initial Note (Signed)
Transition of Care [facility]) - Initial/Assessment Note    Patient Details  Name: Justin [name] MRN: [id number] Date of Birth: 03/01/1923  Transition of [facility] CM/SW Contact:    Bartholomew Crews, RN Phone Number: [phone] 05/18/2019, 3:20 PM  Clinical Narrative:                  Notified by the nurse of patient recommendations for [facility] PT and that family wanted [facility], because they were active with patient's spouse.   Spoke with patient's daughter in law, Sharee Pimple, on the phone who confirmed desire for patient to return home with therapies, and that [facility] was the agency of choice. Referral accepted by [facility] for RN, PT - patient will need [place] orders for RN, PT with Face to Face at discharge.   Patient has a walker at home.   Family to provide transportation home at discharge.   TOC team following for transition needs.   Expected Discharge Plan: [place] Barriers to Discharge: Continued Medical Work up   Patient Goals and CMS Choice Patient states their goals for this hospitalization and ongoing recovery are:: return home with family support and home health CMS Medicare.gov Compare Post Acute Care list provided to:: Patient Choice offered to / list presented to : Adult Children  Expected Discharge Plan and Services Expected Discharge Plan: [place] In-house Referral: Hospice / Palliative Care Discharge Planning Services: CM Consult Post Acute Care Choice: [place] arrangements for the past 2 months: Single Family Home                 DME Arranged: N/A DME Agency: NA       HH Arranged: PT, RN [place] Agency: [facility] Date [place]: 05/18/19 Time [place]: 1519 Representative spoke with at [place]: [place] Arrangements/Services Living arrangements for the past 2 months: [place] with:: Self, Spouse Patient language and need for interpreter  reviewed:: Yes        Need for Family Participation in Patient Care: Yes (Comment) Care giver support system in place?: Yes (comment) Current home services: DME(has a walker) Criminal Activity/Legal Involvement Pertinent to Current Situation/Hospitalization: No - Comment as needed  Activities of Daily Living Home Assistive Devices/Equipment: Other (Comment) ADL Screening (condition at time of admission) Patient's cognitive ability adequate to safely complete daily activities?: No Is the patient deaf or have difficulty hearing?: Yes Does the patient have difficulty seeing, even when wearing glasses/contacts?: No Does the patient have difficulty concentrating, remembering, or making decisions?: Yes Patient able to express need for assistance with ADLs?: No Does the patient have difficulty dressing or bathing?: Yes Independently performs ADLs?: No Communication: Independent Dressing (OT): Needs assistance Is this a change from baseline?: Change from baseline, expected to last >3 days Grooming: Needs assistance Is this a change from baseline?: Change from baseline, expected to last >3 days Feeding: Needs assistance Is this a change from baseline?: Change from baseline, expected to last >3 days Bathing: Needs assistance Is this a change from baseline?: Change from baseline, expected to last >3 days Toileting: Needs assistance Is this a change from baseline?: Change from baseline, expected to last >3days In/Out Bed: Needs assistance Is this a change from baseline?: Change from baseline, expected to last >3 days Walks in Home: Needs assistance Is this a change from baseline?: Change from baseline, expected to last >3  days Does the patient have difficulty walking or climbing stairs?: Yes Weakness of Legs: Both Weakness of Arms/Hands: Both  Permission Sought/Granted                  Emotional Assessment         Alcohol / Substance Use: Not Applicable Psych Involvement: No  (comment)  Admission diagnosis:  Dehydration [E86.0] RUQ abdominal pain [R10.11] AKI (acute kidney injury) ([place]) [N17.9] Longstanding persistent atrial fibrillation ([place]) [I48.11] Patient Active Problem List   Diagnosis Date Noted  . AKI (acute kidney injury) ([place]) 05/18/2019  . Dehydration   . Shortness of breath 03/27/2019  . Bilateral impacted cerumen 08/16/2018  . Foreign body in right ear 08/16/2018  . Presbycusis of both ears 08/16/2018  . AAA (abdominal aortic aneurysm) without rupture ([place]) 07/21/2018  . Anemia 12/06/2017  . Coronary artery disease 11/24/2017  . Dementia ([place]) 11/24/2017  . Essential hypertension 11/24/2017  . Bilateral extracranial carotid artery stenosis 11/24/2017  . NSTEMI (non-ST elevated myocardial infarction) ([place]) 11/20/2017  . Conjunctival hemorrhage of left eye 08/03/2017  . Intermediate stage nonexudative age-related macular degeneration of both eyes 08/03/2017  . Pseudophakia of both eyes 08/03/2017  . S/P AAA repair 06/03/2016  . Primary open angle glaucoma of both eyes, mild stage 06/17/2014  . Atrial fibrillation ([place]) 04/23/2011  . Abdominal aneurysm without mention of rupture 02/23/2011   PCP:  Burnard Bunting, MD Pharmacy:   [place], [place] - [address] [place] [address] Phone: [phone] Fax: [place], [place] - [address] [place] [address] Phone: [phone] Fax: [phone]     Social Determinants of Health ([place]) Interventions    Readmission Risk Interventions No flowsheet data found.

## 2019-05-18 NOTE — Progress Notes (Signed)
PROGRESS NOTE    Justin [name]  AXK:[id number] DOB: March 31, 1923 DOA: 05/14/2019 PCP: Burnard Bunting, MD   Brief Narrative: 86 y.o. male with medical history significant of advanced dementia, paroxysmal atrial fibrillation, AAA, chronic kidney disease, carotid artery disease, CAD, dementia, PAD and BPH presented with weakness, worsening of confusion. Patient's daughter gives the majority of the medical history.plan advanced dementia, main caregiver is his wife.  Wife had a broken arm and hospitalized about 2 weeks ago, and ever since then, patient started to have worsening of his baseline mental status.  Became more confused, and eat and drink less.  Patient son has been living with the patient for last 2 weeks, and reported the patient has had multiple diarrhea for last 3 to 4 days.  No vomiting, cough or shortness of breath. Patient was also diagnosed with severe CAD, and was started on Ranexa, and his cardiology considered conservative management only without any aggressive intervention because of patient age and comorbidities.  Daughter reports that he complains of chest pain almost daily and takes at least 1 or 2 nitroglycerin every night or morning, and usually can relieve the pain within 3 to 5 minutes.  Despite what happened in the last few weeks, patient still received all his blood pressure meds including water pills till yesterday. ED Course: Borderline hypotension, creat 3.76, normal LFT potassium and sodium, bladder scan negative.  Abdomen ultrasound negative  Patient was admitted for further management. 05/24/2019-overnight significant troponin bump with chest pain, troponin 10,000, also with black tarry stool received nitroglycerin and hypotension received IV fluid bolus 1 L.  Cardiology was consulted  Subjective: Seen this morning blood pressure in 80s received 500 mill bolus and blood pressure improved. Son at the bedside. Patient denies any chest pain nausea vomiting or abdominal  pain.  Alert awake with dementia.   Assessment & Plan:  Acute kidney injury on CKD stage IIIa, baseline creat 1.2 (08/8673)/QGBEEF metabolic acidosis: Creatinine improving with aggressive IV fluid hydration, patient on bicarb drip.  Monitor BMP, urine output-urine output is marginal.  Hold lisinopril, Lasix Recent Labs  Lab 06/01/2019 1150 05/18/19 0445  BUN 102* 85*  CREATININE 3.76* 2.51*   NSTEMI, patient with severe CAD ove,rnight chest pain with troponin peaked from 24-> 10884, Dr Einar Gip  was consulted-advised no further management given patient's wishes for non-aggressive approach.  This morning Hemoccult positive Plavix discontinued and continued on aspirin, continue metoprolol as blood pressure tolerates, cont statins. Dr. Rockwell [place] to see the patient today discussed with him.  Hypotension in the setting of nitro use also with black tarry stool and non-ST elevation MI.  Blood pressure is improved after 1.5 L fluids, continue gentle IV fluids.  Blood tarry stool possible GI bleeding: hemoccult came back positive.  Holding Plavix after discussed with the cardiology.  PPI twice daily, serial H&H. Family desires for nonaggressive measures at this time hold off on gi consult  Acute blood loss anemia setting of anemia of chronic disease.  Monitor serial H&H and transfusion as necessary.  Supportive measures. Hold off on GI consult. Recent Labs  Lab 05/29/2019 1150 05/18/19 0642  HGB 10.4* 8.9*  HCT 33.4* 28.2*   ?PAF: Continue aspirin overall poor candidate for anticoagulation.  Deconditioning/debility/generalized weakness: Continue supportive care, palliative care eval.  Acute metabolic encephalopathy in the setting of dementia. Son says he has advanced dementia.  Continue supportive care.  Continue Aricept.  Goals of care: Confirmed DNR status family desires nonaggressive measures, we discussed about palliative care in  the setting of non-ST relation MI and unable to intervene and also with  acute kidney injury possible GI bleeding and anemia overall prognosis guarded.  Palliative care is consulted.  DVT prophylaxis:SCD- no Heparin/lovenox 2/2 tarry stool Code Status: DNR Family Communication: plan of care discussed with patient's son at bedside.  Disposition Plan: Patient is from:home Anticipated Disposition: to TBD Barriers to discharge or conditions that needs to be met prior to discharge: Patient presented with weakness worsening confusion, nonestablished MI, AKI also with black tarry stool possible GI bleed, discussing goals of care and palliative care consulted.  Consultants:cardio, palliative care Procedures:see note Microbiology:see note  Nutrition: Diet Order            Diet renal with fluid restriction Fluid restriction: 1200 mL Fluid; Room service appropriate? Yes; Fluid consistency: Thin  Diet effective now                    Body mass index is 21.52 kg/m.  Medications: Scheduled Meds: . vitamin C  500 mg Oral Daily  . aspirin EC  81 mg Oral Daily  . brimonidine  1 drop Both Eyes Q8H  . donepezil  5 mg Oral Daily  . dorzolamide-timolol  1 drop Both Eyes BID  . dutasteride  0.5 mg Oral Daily  . ferrous sulfate  325 mg Oral BID WC  . latanoprost  1 drop Left Eye QHS  . multivitamin with minerals  1 tablet Oral Daily  . ranolazine  500 mg Oral BID  . rosuvastatin  10 mg Oral QPM   Continuous Infusions: .  sodium bicarbonate (isotonic) infusion in sterile water 110 mL/hr at 05/18/19 0409    Antimicrobials: Anti-infectives (From admission, onward)   None       Objective: Vitals: Today's Vitals   05/18/19 0714 05/18/19 0717 05/18/19 0955 05/18/19 1009  BP: (!) 82/53  117/69   Pulse: 88 95 92   Resp: 16  18   Temp: 97.8 F (36.6 C)   (!) 97.4 F (36.3 C)  TempSrc: Oral   Oral  SpO2: (!) 89% 98% 100%   Weight:      Height:      PainSc:        Intake/Output Summary (Last 24 hours) at 05/18/2019 1320 Last data filed at 05/18/2019  1100 Gross per 24 hour  Intake 2845.83 ml  Output 152 ml  Net 2693.83 ml   Filed Weights   06/03/2019 1117  Weight: 68 kg   Weight change:    Intake/Output from previous day: 04/07 0701 - 04/08 0700 In: 2605.8 [P.O.:120; I.V.:1225.7; IV Piggyback:1260.1] Out: 52 [Urine:50; Stool:2] Intake/Output this shift: Total I/O In: 240 [P.O.:240] Out: 100 [Urine:100]  Examination:  General exam: AAO to self ,NAD, weak appearing. HEENT:Oral mucosa moist, Ear/Nose WNL grossly,dentition normal. Respiratory system: bilaterally clear,no wheezing or crackles,no use of accessory muscle, non tender. Cardiovascular system: S1 & S2 +, regular, No JVD. Gastrointestinal system: Abdomen soft, NT,ND, BS+. Nervous System:Alert, awake, moving extremities and grossly nonfocal Extremities: No edema, distal peripheral pulses palpable.  Skin: No rashes,no icterus. MSK: Normal muscle bulk,tone, power  Data Reviewed: I have personally reviewed following labs and imaging studies CBC: Recent Labs  Lab 06/01/2019 1150 05/18/19 0642  WBC 6.6  --   NEUTROABS 4.5  --   HGB 10.4* 8.9*  HCT 33.4* 28.2*  MCV 105.0*  --   PLT 210  --    Basic Metabolic Panel: Recent Labs  Lab 06/08/2019 1150 05/18/19  0445  NA 139 140  K 5.0 4.7  CL 112* 116*  CO2 16* 13*  GLUCOSE 123* 119*  BUN 102* 85*  CREATININE 3.76* 2.51*  CALCIUM 9.2 8.5*   GFR: Estimated Creatinine Clearance: 17.3 mL/min (A) (by C-G formula based on SCr of 2.51 mg/dL (H)). Liver Function Tests: Recent Labs  Lab 05/14/2019 1150  AST 19  ALT 18  ALKPHOS 64  BILITOT 0.5  PROT 5.7*  ALBUMIN 3.2*   Recent Labs  Lab 06/05/2019 1150  LIPASE 32   No results for input(s): AMMONIA in the last 168 hours. Coagulation Profile: No results for input(s): INR, PROTIME in the last 168 hours. Cardiac Enzymes: No results for input(s): CKTOTAL, CKMB, CKMBINDEX, TROPONINI in the last 168 hours. BNP (last 3 results) No results for input(s): PROBNP in  the last 8760 hours. HbA1C: No results for input(s): HGBA1C in the last 72 hours. CBG: Recent Labs  Lab 05/23/2019 1118  GLUCAP 104*   Lipid Profile: No results for input(s): CHOL, HDL, LDLCALC, TRIG, CHOLHDL, LDLDIRECT in the last 72 hours. Thyroid Function Tests: Recent Labs    06/06/2019 1150  TSH 1.921   Anemia Panel: No results for input(s): VITAMINB12, FOLATE, FERRITIN, TIBC, IRON, RETICCTPCT in the last 72 hours. Sepsis Labs: Recent Labs  Lab 05/14/2019 1150  LATICACIDVEN 1.6    Recent Results (from the past 240 hour(s))  SARS CORONAVIRUS 2 (TAT 6-24 HRS) Nasopharyngeal Nasopharyngeal Swab     Status: None   Collection Time: 06/08/2019  9:33 PM   Specimen: Nasopharyngeal Swab  Result Value Ref Range Status   SARS Coronavirus 2 NEGATIVE NEGATIVE Final    Comment: (NOTE) SARS-CoV-2 target nucleic acids are NOT DETECTED. The SARS-CoV-2 RNA is generally detectable in upper and lower respiratory specimens during the acute phase of infection. Negative results do not preclude SARS-CoV-2 infection, do not rule out co-infections with other pathogens, and should not be used as the sole basis for treatment or other patient management decisions. Negative results must be combined with clinical observations, patient history, and epidemiological information. The expected result is Negative. Fact Sheet for Patients: SugarRoll.be Fact Sheet for Healthcare Providers: https://www.woods-mathews.com/ This test is not yet approved or cleared by the [place] FDA and  has been authorized for detection and/or diagnosis of SARS-CoV-2 by FDA under an Emergency Use Authorization (EUA). This EUA will remain  in effect (meaning this test can be used) for the duration of the COVID-19 declaration under Section 56 4(b)(1) of the Act, 21 U.S.C. section 360bbb-3(b)(1), unless the authorization is terminated or revoked sooner. Performed at [facility], [place] [address], [place], [place] [address]       Radiology Studies: US RENAL  Result Date: 05/11/2019 CLINICAL DATA:  Acute kidney injury. EXAM: RENAL / URINARY TRACT ULTRASOUND COMPLETE COMPARISON:  CT dated Jul 09, 2016. FINDINGS: Right Kidney: Renal measurements: 10.2 x 4.8 x 3.5 cm = volume: 87.6 mL. There is increased cortical echogenicity. There are small cysts noted measuring up to approximately 2.4 cm. There is no hydronephrosis. Left Kidney: Renal measurements: 9.5 x 5.4 x 3.6 cm = volume: 97 mL. The renal cortex is echogenic. There is no hydronephrosis. There is a small cyst measuring approximately 1.3 cm. Bladder: The ureteral jets were not visualized. Other: None. IMPRESSION: 1. No acute abnormality. 2. Echogenic kidneys bilaterally which can be seen in patients with medical renal disease. 3. Simple appearing bilateral renal cysts, for which no further follow-up is required. Electronically Signed  By: Constance Holster M.D.   On: 05/25/2019 21:23   DG Chest Port 1 View  Result Date: 05/27/2019 CLINICAL DATA:  Weakness. EXAM: PORTABLE CHEST 1 VIEW COMPARISON:  November 20, 2017. FINDINGS: Stable cardiomediastinal silhouette. No pneumothorax or pleural effusion is noted. Both lungs are clear. The visualized skeletal structures are unremarkable. IMPRESSION: No acute cardiopulmonary abnormality seen. Electronically Signed   By: Marijo Conception M.D.   On: 05/16/2019 11:53   US Abdomen Limited RUQ  Result Date: 05/22/2019 CLINICAL DATA:  Right upper quadrant pain EXAM: ULTRASOUND ABDOMEN LIMITED RIGHT UPPER QUADRANT COMPARISON:  None. FINDINGS: Gallbladder: No gallstones or Delmundo thickening visualized. No sonographic Murphy sign noted by sonographer. Common bile duct: Diameter: 2 mm Liver: No focal lesion identified. Within normal limits in parenchymal echogenicity. Portal vein is patent on color Doppler imaging with normal direction of blood flow towards the liver. Other: Right renal  cortical thinning. IMPRESSION: No findings to account for reported symptoms. Electronically Signed   By: Macy Mis M.D.   On: 05/23/2019 12:32     LOS: 1 day   Time spent: More than 50% of that time was spent in counseling and/or coordination of care.  Antonieta Pert, MD [facility]  05/18/2019, 1:20 PM

## 2019-05-18 NOTE — Progress Notes (Addendum)
Patient complaining of chest pain. Had complained of same earlier and received NTG last night. Daughter reported that he complains of chest pain daily. CXR, EKG, and enzymes from last night reviewed. Patient states "feels like I'm having a heart attack." Plan to repeat EKG and enzymes now, treat with NTG, continue statin, ASA, Plavix, beta-blocker.    ADDENDUM: Pain completely resolved after NTG x1 but SBP dropped to 80s. He was given 500 cc bolus for the low BP.   EKG demonstrates ST depressions.   While waiting for troponin to result, patient had a black stool; type & screen, H&H, and FOBT ordered.   Troponin came back at 10,185.    Patient remains comfortable after the one NTG and is resting.   Dr. Einar Gip of cardiology kindly reviewed the case with me and advises against any further actions given the patient's previously stated desire to avoid aggressive measures, his advanced age, and with concern that it would only complicate his course. He advised stopping Plavix is FOBT is positive.

## 2019-05-19 ENCOUNTER — Inpatient Hospital Stay (HOSPITAL_COMMUNITY): Payer: Medicare Other

## 2019-05-19 DIAGNOSIS — E860 Dehydration: Secondary | ICD-10-CM

## 2019-05-19 DIAGNOSIS — I4811 Longstanding persistent atrial fibrillation: Secondary | ICD-10-CM

## 2019-05-19 DIAGNOSIS — Z7189 Other specified counseling: Secondary | ICD-10-CM

## 2019-05-19 DIAGNOSIS — Z515 Encounter for palliative care: Secondary | ICD-10-CM

## 2019-05-19 LAB — HEMOGLOBIN AND HEMATOCRIT, BLOOD
HCT: 30.9 % — ABNORMAL LOW (ref 39.0–52.0)
HCT: 29.4 % — ABNORMAL LOW (ref 39.0–52.0)
Hemoglobin: 10.1 g/dL — ABNORMAL LOW (ref 13.0–17.0)
Hemoglobin: 9.6 g/dL — ABNORMAL LOW (ref 13.0–17.0)

## 2019-05-19 LAB — URINE CULTURE: Culture: NO GROWTH

## 2019-05-19 LAB — CBC
HCT: 30.5 % — ABNORMAL LOW (ref 39.0–52.0)
Hemoglobin: 9.9 g/dL — ABNORMAL LOW (ref 13.0–17.0)
MCH: 33.1 pg (ref 26.0–34.0)
MCHC: 32.5 g/dL (ref 30.0–36.0)
MCV: 102 fL — ABNORMAL HIGH (ref 80.0–100.0)
Platelets: 196 K/uL (ref 150–400)
RBC: 2.99 MIL/uL — ABNORMAL LOW (ref 4.22–5.81)
RDW: 13.2 % (ref 11.5–15.5)
WBC: 6.1 K/uL (ref 4.0–10.5)
nRBC: 0 % (ref 0.0–0.2)

## 2019-05-19 LAB — BASIC METABOLIC PANEL
Anion gap: 15 (ref 5–15)
BUN: 57 mg/dL — ABNORMAL HIGH (ref 8–23)
CO2: 21 mmol/L — ABNORMAL LOW (ref 22–32)
Calcium: 8.4 mg/dL — ABNORMAL LOW (ref 8.9–10.3)
Chloride: 107 mmol/L (ref 98–111)
Creatinine, Ser: 1.96 mg/dL — ABNORMAL HIGH (ref 0.61–1.24)
GFR calc Af Amer: 33 mL/min — ABNORMAL LOW (ref >60)
GFR calc non Af Amer: 28 mL/min — ABNORMAL LOW (ref >60)
Glucose, Bld: 140 mg/dL — ABNORMAL HIGH (ref 70–99)
Potassium: 3.9 mmol/L (ref 3.5–5.1)
Sodium: 143 mmol/L (ref 135–145)

## 2019-05-19 LAB — ECHOCARDIOGRAM COMPLETE
Height: 70 in
Weight: 2400 [oz_av]

## 2019-05-19 LAB — BRAIN NATRIURETIC PEPTIDE: B Natriuretic Peptide: 1021 pg/mL — ABNORMAL HIGH (ref 0.0–100.0)

## 2019-05-19 MED ORDER — LEVALBUTEROL HCL 1.25 MG/0.5ML IN NEBU
1.2500 mg | INHALATION_SOLUTION | Freq: Four times a day (QID) | RESPIRATORY_TRACT | Status: DC | PRN
  Administered 2019-05-19 (×2): 1.25 mg via RESPIRATORY_TRACT
  Filled 2019-05-19 (×2): qty 0.5

## 2019-05-19 MED ORDER — SODIUM CHLORIDE 0.9 % IV SOLN: INTRAVENOUS | Status: DC

## 2019-05-19 MED ORDER — ENSURE ENLIVE PO LIQD
237.0000 mL | Freq: Two times a day (BID) | ORAL | Status: DC
  Administered 2019-05-19: 237 mL via ORAL

## 2019-05-19 MED ORDER — SODIUM CHLORIDE 0.9 % IV BOLUS
500.0000 mL | Freq: Once | INTRAVENOUS | Status: AC
  Administered 2019-05-19: 500 mL via INTRAVENOUS

## 2019-05-19 MED ORDER — FUROSEMIDE 10 MG/ML IJ SOLN: 40.0000 mg | Freq: Once | INTRAMUSCULAR | Status: AC

## 2019-05-19 MED ORDER — FUROSEMIDE 10 MG/ML IJ SOLN
INTRAMUSCULAR | Status: AC
  Administered 2019-05-19: 40 mg via INTRAVENOUS
  Filled 2019-05-19: qty 4

## 2019-05-20 DIAGNOSIS — N179 Acute kidney failure, unspecified: Secondary | ICD-10-CM | POA: Diagnosis not present

## 2019-06-04 DIAGNOSIS — R778 Other specified abnormalities of plasma proteins: Secondary | ICD-10-CM

## 2019-06-10 NOTE — Progress Notes (Signed)
Physical Therapy Treatment Patient Details Name: Justin [name] MRN: [id number] DOB: 08/15/1923 Today's Date: 05/31/19    History of Present Illness Pt is a 86 y/o male admitted with weakness and worsening confusion. Pt found to have an AKI and a NSTEMI. Additionally pt with possible GIB. PMH including but not limited to advanced dementia, paroxysmal atrial fibrillation, AAA, chronic kidney disease, carotid artery disease and PAD.    PT Comments    Pt very pleasant and reports feeling well. Wife and granddaughter present throughout session and confirm pt will have 24hr assist and home services at home since wife planning for UE surgery. Pt with increased gait tolerance but continues to fatigue quickly and need UE support for balance and stability in standing. Encourged continued mobility with nursing assist to increase strength and function for return home. Wife very concerned with pt gaining more strength and ability prior to D/C.   Supine 92/54 (66), HR 105, SpO2 99% on rA Sitting 99/66 (73), HR 113, 97% on RA   Follow Up Recommendations  Home health PT;Supervision/Assistance - 24 hour     Equipment Recommendations  None recommended by PT    Recommendations for Other Services       Precautions / Restrictions Precautions Precautions: Fall Precaution Comments: watch VS Restrictions Weight Bearing Restrictions: No    Mobility  Bed Mobility Overal bed mobility: Needs Assistance Bed Mobility: Supine to Sit     Supine to sit: Min guard     General bed mobility comments: HOb 10 degrees with increased time pt able to rise and pivot to left side of bed  Transfers Overall transfer level: Needs assistance   Transfers: Sit to/from Stand Sit to Stand: Min guard         General transfer comment: guarding for safety to stand from bed with pt with initial LOB in standing and need to return to sitting EOB then repeated stand  Ambulation/Gait Ambulation/Gait assistance: Min  assist Gait Distance (Feet): 80 Feet Assistive device: 1 person hand held assist Gait Pattern/deviations: Step-through pattern;Trunk flexed;Decreased stride length   Gait velocity interpretation: 1.31 - 2.62 ft/sec, indicative of limited community ambulator General Gait Details: hand held assist for stability with pt self monitoring and limiting distance appropriately with HR 125 with gait, cues for direction   Stairs             Wheelchair Mobility    Modified Rankin (Stroke Patients Only)       Balance Overall balance assessment: Needs assistance Sitting-balance support: Feet supported;No upper extremity supported Sitting balance-Leahy Scale: Good     Standing balance support: Single extremity supported Standing balance-Leahy Scale: Poor                              Cognition Arousal/Alertness: Awake/alert Behavior During Therapy: WFL for tasks assessed/performed Overall Cognitive Status: History of cognitive impairments - at baseline                                 General Comments: family present and reports cognition as baseline, following all commands, joking with family      Exercises General Exercises - Lower Extremity Long Arc Quad: AROM;Both;Seated;15 reps Hip ABduction/ADduction: AROM;Both;15 reps;Seated Hip Flexion/Marching: AROM;Both;Seated;15 reps    General Comments        Pertinent Vitals/Pain Pain Assessment: No/denies pain    Home Living  Prior Function            PT Goals (current goals can now be found in the care plan section) Progress towards PT goals: Progressing toward goals    Frequency    Min 3X/week      PT Plan Current plan remains appropriate    Co-evaluation              AM-PAC PT "6 Clicks" Mobility   Outcome Measure  Help needed turning from your back to your side while in a flat bed without using bedrails?: A Little Help needed moving from  lying on your back to sitting on the side of a flat bed without using bedrails?: A Little Help needed moving to and from a bed to a chair (including a wheelchair)?: A Little Help needed standing up from a chair using your arms (e.g., wheelchair or bedside chair)?: A Little Help needed to walk in hospital room?: A Little Help needed climbing 3-5 steps with a railing? : A Lot 6 Click Score: 17    End of Session Equipment Utilized During Treatment: Gait belt Activity Tolerance: Patient tolerated treatment well Patient left: in chair;with call bell/phone within reach;with chair alarm set;with family/visitor present Nurse Communication: Mobility status PT Visit Diagnosis: Other abnormalities of gait and mobility (R26.89);Muscle weakness (generalized) (M62.81)     Time: 4734-0370 PT Time Calculation (min) (ACUTE ONLY): 27 min  Charges:  $Gait Training: 8-22 mins $Therapeutic Exercise: 8-22 mins                     Latishia Suitt P, PT Acute Rehabilitation Services Pager: [phone] Office: [place] 06/07/2019, 12:47 PM

## 2019-06-10 NOTE — Progress Notes (Addendum)
Patient  auscultated for 2 minutes. no breath and no Heart rate. witnessed by this RN and charge nurse Candise Bowens. patient passed away at 22:15

## 2019-06-10 NOTE — Progress Notes (Signed)
Called and spoke with patient's wife, Justin [name] and son about patient's passing. Patient's son will to come see patient. Son states, "funeral home is [facility] [facility]"

## 2019-06-10 NOTE — Progress Notes (Signed)
Called [employer]. Spoke with Maryjean Morn. Reference number : [id number]

## 2019-06-10 NOTE — Progress Notes (Signed)
@  21:13  Patient still having DOB.RR=28. Charge Nurse tina is monitoring the patient. Change [place] to NRB mask. Text paged on call provider, NP Blount to make aware. Received a call back will check patient's chart.   @21 :20 : Patient noted to have increase WOB, wet rales and crackles noted. RR 28. Charge nurse tina is in the room monitoring the patient. Text paged NP Blount, to update about patient status and if we can give him Lasix IV and Nebulization. Received orders, ok to give nebulization and lasix IV and do CXray stat.

## 2019-06-10 NOTE — Plan of Care (Signed)
  Problem: Pain Managment: Goal: General experience of comfort will improve Outcome: Progressing   

## 2019-06-10 NOTE — Progress Notes (Signed)
Patient c/o left chest pain,states "it's pretty bad". Nitro SL given as previously ordered. Patient states relief after 1 nitro. BP post nitro 93/55 HR 113. Blount,NP made aware. No new orders. Will continue to monitor. Lavana Huckeba, Wonda Cheng, Therapist, sports

## 2019-06-10 NOTE — Progress Notes (Signed)
After giving Lasix IV and xopenex nebulizer treatment, patient began agonal breathing. Farrel Gobble, RN to patient's bedside.

## 2019-06-10 NOTE — Progress Notes (Signed)
Pts grand daughter said he was c/o chest discomfort, took nitro to administer but when asked asked the pt he said he is fine, checked his lungs could hear wheezing, noticed abdominal breathing, notified Dr. Maren Beach, gave new order for Xopenex neb, administered the med.  Pt also voiced he is feeling good, will continue to monitor.

## 2019-06-10 NOTE — Death Summary Note (Signed)
DEATH SUMMARY   Patient Details  Name: Justin [name] MRN: [id number] DOB: September 06, 1923  Admission/Discharge Information   Admit Date:  2019-05-23  Date of Death: Date of Death: 2019-05-25  Time of Death: Time of Death: 2213/05/07  Length of Stay: 3  Referring Physician: Burnard Bunting, MD   Reason(s) for Hospitalization  Weakness, confusion, chest pain  Diagnoses  Preliminary cause of death: NSTEMI Secondary Diagnoses (including complications and co-morbidities):  Active Problems:   AKI (acute kidney injury) [facility])   Palliative care encounter   Encounter for hospice care discussion Acute systolic heart failure Severe CAD-not a revascularization candidate Acute kidney injury on CKD stage IIIa, baseline creat 1.2 (05/2704)/CBJSEG metabolic acidosis: Hypotension in the setting of nitro use also with black tarry stool and non-ST elevation MI. Blood tarry stool possible GI bleeding: hemoccult came back positive Acute blood loss anemia setting of anemia of chronic disease ?PAF: Deconditioning/debility/generalized weakness Acute metabolic encephalopathy in the setting of dementia.   Brief Hospital Course (including significant findings, care, treatment, and services provided and events leading to death)  Justin [name] is a 86 y.o. year old male who 86 y.o.malewith medical history significant ofadvanced dementia, paroxysmalatrial fibrillation, AAA, chronic kidney disease, carotid artery disease, CAD, dementia, severe CAD, PADand BPH presented with weakness,worsening of confusion. Patient's daughter gives the majority of the medical history.plan advanced dementia, main caregiver is his wife. Wife had a broken arm and hospitalized about 2 weeks ago, and ever since then,patient started to have worsening of his baseline mental status. Became more confused, and eat and drink less. Patient son has been living with the patient for last 2 weeks, and reported the patient has had multiple diarrhea  for last 3 to 4 days. Novomiting, cough or shortness of breath. Patient was also diagnosed with severe CAD, and was started on Ranexa, and his cardiology considered conservative management only without any aggressive intervention because of patient age and comorbidities. Daughter reports that he complains of chest pain almost daily and takes at least 1 or 2 nitroglycerin everynight or morning, and usually canrelieve the pain within 3 to 5 minutes.Despite what happened in the last few weeks, patient still received all his blood pressure meds including water pills tillyesterday.ED Course:Borderline hypotension, creat 3.76, normal LFT potassium and sodium, bladder scan negative.Abdomen ultrasound negative Patient was admitted for further management. 23-May-2019-overnight significant troponin bump with chest pain, troponin 10,000, also with black tarry stool received nitroglycerin and hypotension received IV fluid bolus 1 L.  Cardiology was consulted. Patient had complicated course with non-ST elevation MI, black tarry stools or for GI bleed Hemoccult positive, AKI, acute blood loss anemia, hypotension, ACUTE metabolic encephalopathy/delirium in the setting of dementia. hIS Plavix was stopped, seen by cardiology.  He continued ongoing chest pain and dyspnea with exertion and during night, needing PRN nitroglycerin, he remained DNR seen by palliative care. Plan was for discharge to hospice care with expected life expectancy less than 6 months.  5 he was aware about his poor prognosis including possibility of sudden death given his MI. Patient is started to decline during the night became more hypoxic having chest pain and subsequently passed away DURING NIGHT.  Pertinent Labs and Studies  Significant Diagnostic Studies US RENAL  Result Date: 05/23/19 CLINICAL DATA:  Acute kidney injury. EXAM: RENAL / URINARY TRACT ULTRASOUND COMPLETE COMPARISON:  CT dated Jul 09, 2016. FINDINGS: Right Kidney: Renal  measurements: 10.2 x 4.8 x 3.5 cm = volume: 87.6 mL. There is increased cortical echogenicity.  There are small cysts noted measuring up to approximately 2.4 cm. There is no hydronephrosis. Left Kidney: Renal measurements: 9.5 x 5.4 x 3.6 cm = volume: 97 mL. The renal cortex is echogenic. There is no hydronephrosis. There is a small cyst measuring approximately 1.3 cm. Bladder: The ureteral jets were not visualized. Other: None. IMPRESSION: 1. No acute abnormality. 2. Echogenic kidneys bilaterally which can be seen in patients with medical renal disease. 3. Simple appearing bilateral renal cysts, for which no further follow-up is required. Electronically Signed   By: Constance Holster M.D.   On: 05/11/2019 21:23   DG CHEST PORT 1 VIEW  Result Date: 06-17-2019 CLINICAL DATA:  Tachycardia EXAM: PORTABLE CHEST 1 VIEW COMPARISON:  06/03/2019 FINDINGS: Cardiac shadow is stable. Aortic calcifications are seen. The lungs are well aerated bilaterally. Slight increased opacity is noted in the bases bilaterally consistent with atelectasis. No sizable effusion is seen. No bony abnormality is noted. IMPRESSION: New bibasilar atelectasis. Electronically Signed   By: Inez Catalina M.D.   On: 2019/06/17 08:52   DG Chest Port 1 View  Result Date: 06/08/2019 CLINICAL DATA:  Weakness. EXAM: PORTABLE CHEST 1 VIEW COMPARISON:  November 20, 2017. FINDINGS: Stable cardiomediastinal silhouette. No pneumothorax or pleural effusion is noted. Both lungs are clear. The visualized skeletal structures are unremarkable. IMPRESSION: No acute cardiopulmonary abnormality seen. Electronically Signed   By: Marijo Conception M.D.   On: 06/09/2019 11:53   ECHOCARDIOGRAM COMPLETE  Result Date: 06-17-2019    ECHOCARDIOGRAM REPORT   Patient Name:   Justin [name] Date of Exam: 06-17-2019 Medical Rec #:  [id number]      Height:       70.0 in Accession #:    [id number]     Weight:       150.0 lb Date of Birth:  January 04, 1924      BSA:          1.847 m  Patient Age:    61 years       BP:           99/61 mmHg Patient Gender: M              HR:           105 bpm. Exam Location:  Inpatient Procedure: 2D Echo Indications:    NSTEMI I21.4  History:        Patient has prior history of Echocardiogram examinations, most                 recent 11/21/2017. CAD, Arrythmias:Atrial Fibrillation; Risk                 Factors:Hypertension. Chronic kidney disease, AAA, carotid                 artery disease, GERD.  Sonographer:    Darlina Sicilian RDCS Referring Phys: [id number] [place] IMPRESSIONS  1. Presence of mitral apparatus calcification makes diastolic function evaluation difficult. There is mid to distal anterior, septal and apical severe hypokinesis. . Left ventricular ejection fraction, by estimation, is 30 to 35%. The left ventricle has  moderately decreased function. There is mild left ventricular hypertrophy. Left ventricular diastolic parameters are consistent with Grade II diastolic dysfunction (pseudonormalization).  2. Right ventricular systolic function is normal. The right ventricular size is normal. There is normal pulmonary artery systolic pressure.  3. Left atrial size was moderately dilated.  4. The mitral valve is degenerative. Mild to moderate mitral valve regurgitation. No evidence  of mitral stenosis.  5. The aortic valve is tricuspid. Aortic valve regurgitation is not visualized. Mild aortic valve stenosis. Aortic valve area, by VTI measures 1.52 cm. Aortic valve Vmax measures 1.54 m/s.  6. Aortic Mild calcifiction of the aortic root and ascending aorta. FINDINGS  Left Ventricle: Presence of mitral apparatus calcification makes diastolic function evaluation difficult. There is mid to distal anterior, septal and apical severe hypokinesis. Left ventricular ejection fraction, by estimation, is 30 to 35%. The left ventricle has moderately decreased function. The left ventricular internal cavity size was normal in size. There is mild left ventricular  hypertrophy. Left ventricular diastolic parameters are consistent with Grade II diastolic dysfunction (pseudonormalization). Right Ventricle: The right ventricular size is normal. No increase in right ventricular Madry thickness. Right ventricular systolic function is normal. There is normal pulmonary artery systolic pressure. The tricuspid regurgitant velocity is 2.16 m/s, and  with an assumed right atrial pressure of 3 mmHg, the estimated right ventricular systolic pressure is 60.6 mmHg. Left Atrium: Left atrial size was moderately dilated. Right Atrium: Right atrial size was normal in size. Pericardium: There is no evidence of pericardial effusion. Mitral Valve: The mitral valve is degenerative in appearance. There is mild thickening of the mitral valve leaflet(s). There is moderate calcification of the mitral valve leaflet(s). Mild to moderate mitral annular calcification. Mild to moderate mitral valve regurgitation. No evidence of mitral valve stenosis. Tricuspid Valve: The tricuspid valve is normal in structure. Tricuspid valve regurgitation is mild. Aortic Valve: The aortic valve is tricuspid. . There is mild thickening and moderate calcification of the aortic valve. Aortic valve regurgitation is not visualized. Mild aortic stenosis is present. Mild to moderate aortic valve annular calcification. There is mild thickening of the aortic valve. There is moderate calcification of the aortic valve. Aortic valve mean gradient measures 5.0 mmHg. Aortic valve peak gradient measures 9.5 mmHg. Aortic valve area, by VTI measures 1.52 cm. Pulmonic Valve: The pulmonic valve was grossly normal. Pulmonic valve regurgitation is not visualized. Aorta: Mild calcifiction of the aortic root and ascending aorta. There is minimal (Grade I) plaque. IAS/Shunts: The interatrial septum was not assessed.  LEFT VENTRICLE PLAX 2D LVIDd:         3.75 cm      Diastology LVIDs:         3.20 cm      LV e' lateral:   9.36 cm/s LV PW:          0.90 cm      LV E/e' lateral: 5.3 LV IVS:        1.30 cm      LV e' medial:    5.77 cm/s LVOT diam:     1.80 cm      LV E/e' medial:  8.7 LV SV:         41 LV SV Index:   22 LVOT Area:     2.54 cm  LV Volumes (MOD) LV vol d, MOD A2C: 80.0 ml LV vol d, MOD A4C: 115.0 ml LV vol s, MOD A2C: 55.2 ml LV vol s, MOD A4C: 77.8 ml LV SV MOD A2C:     24.8 ml LV SV MOD A4C:     115.0 ml LV SV MOD BP:      29.2 ml LEFT ATRIUM             Index LA diam:        3.20 cm 1.73 cm/m LA Vol (A2C):   52.0 ml  28.15 ml/m LA Vol (A4C):   59.5 ml 32.21 ml/m LA Biplane Vol: 60.8 ml 32.92 ml/m  AORTIC VALVE AV Area (Vmax):    1.60 cm AV Area (Vmean):   1.81 cm AV Area (VTI):     1.52 cm AV Vmax:           154.00 cm/s AV Vmean:          97.600 cm/s AV VTI:            0.272 m AV Peak Grad:      9.5 mmHg AV Mean Grad:      5.0 mmHg LVOT Vmax:         96.70 cm/s LVOT Vmean:        69.400 cm/s LVOT VTI:          0.163 m LVOT/AV VTI ratio: 0.60  AORTA Ao Root diam: 3.50 cm MITRAL VALVE                TRICUSPID VALVE MV Area (PHT): 4.31 cm     TR Peak grad:   18.7 mmHg MV Decel Time: 176 msec     TR Vmax:        216.00 cm/s MV E velocity: 49.99 cm/s MV A velocity: 122.00 cm/s  SHUNTS MV E/A ratio:  0.41         Systemic VTI:  0.16 m                             Systemic Diam: 1.80 cm Adrian Prows MD Electronically signed by Adrian Prows MD Signature Date/Time: 05-24-19/5:36:46 PM    Final    US Abdomen Limited RUQ  Result Date: 06/08/2019 CLINICAL DATA:  Right upper quadrant pain EXAM: ULTRASOUND ABDOMEN LIMITED RIGHT UPPER QUADRANT COMPARISON:  None. FINDINGS: Gallbladder: No gallstones or Keator thickening visualized. No sonographic Murphy sign noted by sonographer. Common bile duct: Diameter: 2 mm Liver: No focal lesion identified. Within normal limits in parenchymal echogenicity. Portal vein is patent on color Doppler imaging with normal direction of blood flow towards the liver. Other: Right renal cortical thinning. IMPRESSION: No findings  to account for reported symptoms. Electronically Signed   By: Macy Mis M.D.   On: 05/26/2019 12:32    Microbiology Recent Results (from the past 240 hour(s))  Urine Culture     Status: None   Collection Time: 05/12/2019  3:10 PM   Specimen: Urine, Clean Catch  Result Value Ref Range Status   Specimen Description URINE, CLEAN CATCH  Final   Special Requests NONE  Final   Culture   Final    NO GROWTH Performed at [facility], [address], [place], [place] [address]    Report Status May 24, 2019 FINAL  Final  SARS CORONAVIRUS 2 (TAT 6-24 HRS) Nasopharyngeal Nasopharyngeal Swab     Status: None   Collection Time: 05/31/2019  9:33 PM   Specimen: Nasopharyngeal Swab  Result Value Ref Range Status   SARS Coronavirus 2 NEGATIVE NEGATIVE Final    Comment: (NOTE) SARS-CoV-2 target nucleic acids are NOT DETECTED. The SARS-CoV-2 RNA is generally detectable in upper and lower respiratory specimens during the acute phase of infection. Negative results do not preclude SARS-CoV-2 infection, do not rule out co-infections with other pathogens, and should not be used as the sole basis for treatment or other patient management decisions. Negative results must be combined with clinical observations, patient history, and epidemiological information. The  expected result is Negative. Fact Sheet for Patients: SugarRoll.be Fact Sheet for Healthcare Providers: https://www.woods-mathews.com/ This test is not yet approved or cleared by the [place] FDA and  has been authorized for detection and/or diagnosis of SARS-CoV-2 by FDA under an Emergency Use Authorization (EUA). This EUA will remain  in effect (meaning this test can be used) for the duration of the COVID-19 declaration under Section 56 4(b)(1) of the Act, 21 U.S.C. section 360bbb-3(b)(1), unless the authorization is terminated or revoked sooner. Performed at [facility], [place]  [address] [place], [place] [address]     Lab Basic Metabolic Panel: Recent Labs  Lab 05/21/2019 1150 05/18/19 0445 05-22-2019 0509  NA 139 140 143  K 5.0 4.7 3.9  CL 112* 116* 107  CO2 16* 13* 21*  GLUCOSE 123* 119* 140*  BUN 102* 85* 57*  CREATININE 3.76* 2.51* 1.96*  CALCIUM 9.2 8.5* 8.4*   Liver Function Tests: Recent Labs  Lab 05/18/2019 1150  AST 19  ALT 18  ALKPHOS 64  BILITOT 0.5  PROT 5.7*  ALBUMIN 3.2*   Recent Labs  Lab 06/06/2019 1150  LIPASE 32   No results for input(s): AMMONIA in the last 168 hours. CBC: Recent Labs  Lab 06/01/2019 1150 05/18/2019 1150 05/18/19 0642 05/18/19 2015 05-22-2019 0509 22-May-2019 1157 May 22, 2019 1832  WBC 6.6  --   --   --  6.1  --   --   NEUTROABS 4.5  --   --   --   --   --   --   HGB 10.4*   < > 8.9* 10.1* 9.9* 10.1* 9.6*  HCT 33.4*   < > 28.2* 30.6* 30.5* 30.9* 29.4*  MCV 105.0*  --   --   --  102.0*  --   --   PLT 210  --   --   --  196  --   --    < > = values in this interval not displayed.   Cardiac Enzymes: No results for input(s): CKTOTAL, CKMB, CKMBINDEX, TROPONINI in the last 168 hours. Sepsis Labs: Recent Labs  Lab 05/31/2019 1150 May 22, 2019 0509  WBC 6.6 6.1  LATICACIDVEN 1.6  --     Procedures/Operations  See note.  Kaiel Weide [facility] 05/23/2019, 3:01 PM

## 2019-06-10 NOTE — Progress Notes (Signed)
  Echocardiogram 2D Echocardiogram has been performed.  Justin [name] M 05/29/19, 11:54 AM

## 2019-06-10 NOTE — Progress Notes (Addendum)
Daily Progress Note   Patient Name: Justin [name]       Date: 2019-05-24 DOB: 1923-05-20  Age: 86 y.o. MRN#: [id number] Attending Physician: Antonieta Pert, MD Primary Care Physician: Burnard Bunting, MD Admit Date: 06/01/2019  Reason for Consultation/Follow-up: Establishing goals of care  Spoke briefly with Dr. Einar Gip who feels patient would be appropriate for hospice services in the home.  Discussed with Dr. Maren Beach who expressed concern about how the patient is going to do.   Medical team feels he would benefit most from Hospice services.  Patient eating 25 - 50% of his meals.   Subjective: Patient jokes when I ask him how he feels - he rubs his arm and states "I feel pretty good!".  He denies chest pain currently.  He asks when did I have my heart attack.  His grand daughter at bedside replies - yesterday.    Colletta [place] Rockie Neighbours) conveys that the plan is for Justin [name] to go to [facility] for 1 month while his wife has surgery on his arm.  We talked about Palliative vs Hospice.  Family wants whatever is best for Justin [name].  I spoke with Orpah Greek on the phone at 4:45 pm.  We discussed Palliative care vs Hospice care.  Both are well received at [facility].  He will consider this further.  We discussed the patient's echocardiogram.  The patient's family understood from Cardiology that his EF of 30 - 35% will improve with rest.  We talked about the fact that his severe CAD is not going away and not going to heal.   Assessment: Very pleasant [employer].  Severe CAD.  On-going tachycardia and intermittent CP even at rest.  Not a candidate for invasive procedures.   Patient Profile/HPI:  86 y.o. male  with past medical history of PUD, CKD 3, severe CAD, atrial fibrillation, AAA, lumbar stenosis and  spondylosis, PAD and advanced dementia who was admitted on 05/30/2019 with weakness and increased confusion.  His oral intake had been decreased for two weeks since his wife (primary care taker) was hospitalized with a fractured wrist.  He had black tarry stool and his creatinine had risen to 3.76.  Troponin was > 10,000, hgb was down and blood pressures were soft.  He was evaluated by Cardiology (Dr. Einar Gip) who recommended medical management and no  invasive procedures.     Length of Stay: 2  Current Medications: Scheduled Meds:  . vitamin C  500 mg Oral Daily  . aspirin EC  81 mg Oral Daily  . brimonidine  1 drop Both Eyes Q8H  . donepezil  5 mg Oral Daily  . dorzolamide-timolol  1 drop Both Eyes BID  . dutasteride  0.5 mg Oral Daily  . feeding supplement (ENSURE ENLIVE)  237 mL Oral BID BM  . ferrous sulfate  325 mg Oral BID WC  . furosemide  40 mg Oral BID  . latanoprost  1 drop Left Eye QHS  . metoprolol tartrate  12.5 mg Oral BID  . multivitamin with minerals  1 tablet Oral Daily  . pantoprazole (PROTONIX) IV  40 mg Intravenous Q12H  . ranolazine  500 mg Oral BID  . rosuvastatin  10 mg Oral QPM    Continuous Infusions:   PRN Meds: acetaminophen, labetalol, metoprolol tartrate, nitroGLYCERIN, ondansetron (ZOFRAN) IV  Physical Exam       Elderly pleasantly demented male, awake, alert, appropriate. CV rrr Resp no distress - politely refuses to take a deep breath. Abdomen soft, nt, somewhat distended (different from yesterday).  Vital Signs: BP 100/61 (BP Location: Left Arm)   Pulse 97   Temp 97.6 F (36.4 C) (Oral)   Resp 20   Ht 5\' 10"  (1.778 m)   Wt 68 kg   SpO2 94%   BMI 21.52 kg/m  SpO2: SpO2: 94 % O2 Device: O2 Device: Room Air O2 Flow Rate: O2 Flow Rate (L/min): 2 L/min  Intake/output summary:   Intake/Output Summary (Last 24 hours) at 05-29-2019 1434 Last data filed at 2019/05/29 1300 Gross per 24 hour  Intake 2214.39 ml  Output 1450 ml  Net 764.39 ml    LBM: Last BM Date: 05/18/19 Baseline Weight: Weight: 68 kg Most recent weight: Weight: 68 kg       Palliative Assessment/Data:  40%      Patient Active Problem List   Diagnosis Date Noted  . AKI (acute kidney injury) ([place]) 05/11/2019  . Dehydration   . Shortness of breath 03/27/2019  . Bilateral impacted cerumen 08/16/2018  . Foreign body in right ear 08/16/2018  . Presbycusis of both ears 08/16/2018  . AAA (abdominal aortic aneurysm) without rupture ([place]) 07/21/2018  . Anemia 12/06/2017  . Coronary artery disease 11/24/2017  . Dementia ([place]) 11/24/2017  . Essential hypertension 11/24/2017  . Bilateral extracranial carotid artery stenosis 11/24/2017  . NSTEMI (non-ST elevated myocardial infarction) ([place]) 11/20/2017  . Conjunctival hemorrhage of left eye 08/03/2017  . Intermediate stage nonexudative age-related macular degeneration of both eyes 08/03/2017  . Pseudophakia of both eyes 08/03/2017  . S/P AAA repair 06/03/2016  . Primary open angle glaucoma of both eyes, mild stage 06/17/2014  . Atrial fibrillation ([place]) 04/23/2011  . Abdominal aneurysm without mention of rupture 02/23/2011    Palliative Care Plan    Recommendations/Plan:  Will talk with son (as wife is overwhelmed right now) this evening regarding hospice vs Palliative.  Will DC fluids as patient's abdomen is becoming distended.  Goals of Care and Additional Recommendations:  Limitations on Scope of Treatment: Full Scope Treatment  Code Status:  DNR  Prognosis:   < 6 months.  Could be considerably less given his on-going angina and an inability to tolerate and invasive procedure.   Discharge Planning:  To Be Determined  Likely [facility] with either Hospice or Palliative.  Care plan was discussed with Dr.  Ramesh.  Thank you for allowing the Palliative Medicine Team to assist in the care of this patient.  Total time spent: 70 min.       Greater than 50%  of this time was spent  counseling and coordinating care related to the above assessment and plan.  Florentina Jenny, PA-C Palliative Medicine  Please contact Palliative MedicineTeam phone at [phone] for questions and concerns between 7 am - 7 pm.   Please see AMION for individual provider pager numbers.

## 2019-06-10 NOTE — Progress Notes (Signed)
Initial Nutrition Assessment  DOCUMENTATION CODES:   Not applicable  INTERVENTION:   Recommend liberalizing diet to REGULAR given AKI improving with no electrolyte abnormalites and pt with poor oral intake and advanced age  Ensure Enlive po BID, each supplement provides 350 kcal and 20 grams of protein    NUTRITION DIAGNOSIS:   Inadequate oral intake related to decreased appetite, lethargy/confusion as evidenced by meal completion < 50%, per patient/family report.  GOAL:   Patient will meet greater than or equal to 90% of their needs  MONITOR:   Supplement acceptance, Weight trends, Labs, PO intake  REASON FOR ASSESSMENT:   Malnutrition Screening Tool    ASSESSMENT:   86 yo male admitted with weakness and worsening confusion and found to have NSTEMI, AKI/CKDIII,  possible GI bleed. PMH includes dementia, CAD, HTN, hiatal hernia, GERD, CKD   RD working remotely.  Pt with advanced dementia, wife is main caregiver. Pt with worsening mental status, eating and drinking less since wife was hospitalized 2 weeks ago.   Recorded po intake 25-50% of meals  Current recorded weight of 68 kg from admission; unsure of measured or stated. No weight since, Unsure of accuracy of admit weight. No weight loss per weight enocunters  Labs: potassium 3.9 (wdl), Creatinine 1.96, BUN 57 Meds:  NS at 75 ml/hr, Vitamin C, ferrous sulfate, lasix, MVI with minerals  Diet Order:  RENAL with FLUID RESTRICTION  EDUCATION NEEDS:   Not appropriate for education at this time  Skin:  Skin Assessment: Reviewed RN Assessment  Last BM:  4/8  Height:   Ht Readings from Last 1 Encounters:  05/18/2019 5\' 10"  (1.778 m)    Weight:   Wt Readings from Last 1 Encounters:  05/29/2019 68 kg    BMI:  Body mass index is 21.52 kg/m.  Estimated Nutritional Needs:   Kcal:  1650-1800 kcals  Protein:  80-90 g  Fluid:  >/= 1.6 L    Kerman Passey MS, RDN, LDN, CNSC RD Pager Number and  Weekend/On-Call After Hours Pager Located in [place]

## 2019-06-10 NOTE — Progress Notes (Signed)
Called to bedside by Charge Nurse Otila Kluver. Patient in agonal breathing, HR on the 20's.

## 2019-06-10 NOTE — Progress Notes (Signed)
Received call from CCMD that patient's HR sustaining in the 120's-140's. Patient alert and able to communicate to RN he's having left chest pressure that comes and goes. BP 96/53 HR 120's-140's. Text paged [place],NP and made aware. Order received to give 500 cc NS bolus. Will continue to monitor. Delora Gravatt, Wonda Cheng, Therapist, sports

## 2019-06-10 NOTE — Progress Notes (Signed)
PROGRESS NOTE    Justin [name]  GMW:[id number] DOB: 02-Apr-1923 DOA: 05/28/2019 PCP: Justin Bunting, MD   Brief Narrative: 86 y.o. male with medical history significant of advanced dementia, paroxysmal atrial fibrillation, AAA, chronic kidney disease, carotid artery disease, CAD, dementia, PAD and BPH presented with weakness, worsening of confusion. Patient's daughter gives the majority of the medical history.plan advanced dementia, main caregiver is his wife.  Wife had a broken arm and hospitalized about 2 weeks ago, and ever since then, patient started to have worsening of his baseline mental status.  Became more confused, and eat and drink less.  Patient son has been living with the patient for last 2 weeks, and reported the patient has had multiple diarrhea for last 3 to 4 days.  No vomiting, cough or shortness of breath. Patient was also diagnosed with severe CAD, and was started on Ranexa, and his cardiology considered conservative management only without any aggressive intervention because of patient age and comorbidities.  Daughter reports that he complains of chest pain almost daily and takes at least 1 or 2 nitroglycerin every night or morning, and usually can relieve the pain within 3 to 5 minutes.  Despite what happened in the last few weeks, patient still received all his blood pressure meds including water pills till yesterday. ED Course: Borderline hypotension, creat 3.76, normal LFT potassium and sodium, bladder scan negative.  Abdomen ultrasound negative  Patient was admitted for further management. 06/09/2019-overnight significant troponin bump with chest pain, troponin 10,000, also with black tarry stool received nitroglycerin and hypotension received IV fluid bolus 1 L.  Cardiology was consulted  Subjective:  Patient has been hypotensive tachycardic. Received nitroglycerin during the night for chest pain. Chest x-ray this morning no fluid overload, with bilateral basal  atelectasis, recent IV fluids for normal during the night and this morning and blood pressure improving. Alert awake oriented x1-2.  No chest pain this morning  Assessment & Plan:  Acute kidney injury on CKD stage IIIa, baseline creat 1.2 (05/4032)/[id number] metabolic acidosis: Creatinine improving with aggressive IV fluid hydration, bicarb drip.  We will switch to normal saline, monitor BMP urine output.Hold lisinopril, Lasix for now. Recent Labs  Lab 05/23/2019 1150 05/18/19 0445 20-May-2019 0509  BUN 102* 85* 57*  CREATININE 3.76* 2.51* 1.96*   NSTEMI, patient with severe CAD 4/6 night chest pain with troponin peaked from 24-> 10884, Dr Justin [name]  was consulted- saw advised no further management given patient's wishes for non-aggressive approach, Hemoccult positive Plavix discontinued and continued on aspirin, metoprolol as blood pressure tolerates, Crestor, Ranexa .  Echocardiogram pending.    Hypotension in the setting of nitro use also with black tarry stool and non-ST elevation MI.  Patient has been needing intermittent IV fluid boluses.  Monitor.  Blood pressure remains to be seen how he responds.  Lasix and metoprolol as tolerated.  Blood tarry stool possible GI bleeding: hemoccult came back positive.  Holding Plavix after discussing with the cardiology.  Cont PPI twice daily, serial H&H. Family desires for nonaggressive measures at this time.   Acute blood loss anemia setting of anemia of chronic disease.  Monitor serial H&H and transfusion as necessary.  Supportive measures.  Hemoglobin overall fairly stable.  Holding off on Plavix. Recent Labs  Lab 06/07/2019 1150 05/18/19 0642 05/18/19 2015 May 20, 2019 0509  HGB 10.4* 8.9* 10.1* 9.9*  HCT 33.4* 28.2* 30.6* 30.5*   ?PAF: Continue aspirin overall poor candidate for anticoagulation.  Deconditioning/debility/generalized weakness: Continue supportive care, palliative care eval.  Acute metabolic encephalopathy in the setting of dementia. Son  says he has advanced dementia.  Continue supportive care.  Continue Aricept.  Goals of care: Confirmed DNR status family desires nonaggressive measures, we discussed about palliative care in the setting of non-ST relation MI and unable to intervene and also with acute kidney injury possible GI bleeding and anemia overall prognosis guarded.  Palliative care is following closely and will need to see how he does.   DVT prophylaxis:SCD- no Heparin/lovenox 2/2 tarry stool Code Status: DNR Family Communication: Plan of care discussed with patient's son 4/ at bedside.I did call and updated Justin [name] is the health care POA and updated at # [phone]   Disposition Plan: Patient is from:home Anticipated Disposition: to [facility]. Barriers to discharge or conditions that needs to be met prior to discharge: Patient presented with weakness worsening confusion, NSTEMI,AKI also with black tarry stool possible GI bleed, remains hospitalized for ongoing medical management anticipating skilled nursing facility versus hospice.  Palliative care following.   Consultants:cardio, palliative care Procedures:see note Microbiology:see note  Nutrition: Diet Order            Diet renal with fluid restriction Fluid restriction: 1200 mL Fluid; Room service appropriate? Yes; Fluid consistency: Thin  Diet effective now                    Body mass index is 21.52 kg/m.  Medications: Scheduled Meds: . vitamin C  500 mg Oral Daily  . aspirin EC  81 mg Oral Daily  . brimonidine  1 drop Both Eyes Q8H  . donepezil  5 mg Oral Daily  . dorzolamide-timolol  1 drop Both Eyes BID  . dutasteride  0.5 mg Oral Daily  . ferrous sulfate  325 mg Oral BID WC  . furosemide  40 mg Oral BID  . latanoprost  1 drop Left Eye QHS  . metoprolol tartrate  12.5 mg Oral BID  . multivitamin with minerals  1 tablet Oral Daily  . pantoprazole (PROTONIX) IV  40 mg Intravenous Q12H  . ranolazine  500 mg Oral BID  . rosuvastatin   10 mg Oral QPM   Continuous Infusions: . sodium chloride 75 mL/hr at 06-18-19 1041    Antimicrobials: Anti-infectives (From admission, onward)   None       Objective: Vitals: Today's Vitals   06-18-19 0710 Jun 18, 2019 0755 Jun 18, 2019 1006 06/18/2019 1033  BP: (!) 82/55 (!) 82/53 99/61   Pulse: (!) 110 (!) 114 (!) 113 (!) 105  Resp: 20 (!) 24 (!) 22   Temp: 98.1 F (36.7 C) 97.8 F (36.6 C) 98.1 F (36.7 C)   TempSrc: Oral Oral    SpO2: 94% 93% 99%   Weight:      Height:      PainSc:        Intake/Output Summary (Last 24 hours) at 06-18-2019 1149 Last data filed at 06/18/2019 1128 Gross per 24 hour  Intake 2094.39 ml  Output 1450 ml  Net 644.39 ml   Filed Weights   05/12/2019 1117  Weight: 68 kg   Weight change:    Intake/Output from previous day: 04/08 0701 - 04/09 0700 In: 2214.4 [P.O.:660; I.V.:1137.1; IV Piggyback:417.3] Out: 1150 [Urine:1150] Intake/Output this shift: Total I/O In: 120 [P.O.:120] Out: 400 [Urine:400]  Examination:  General exam: AAO to self, place,NAD, weak appearing. HEENT:Oral mucosa moist, Ear/Nose WNL grossly,dentition normal. Respiratory system: bilaterally clear,no wheezing or crackles,no use of accessory muscle, non tender. Cardiovascular system: S1 &  S2 +, regular, No JVD. Gastrointestinal system: Abdomen soft, NT,ND, BS+. Nervous System:Alert, awake, moving extremities and grossly nonfocal Extremities: No edema, distal peripheral pulses palpable.  Skin: No rashes,no icterus. MSK: Normal muscle bulk,tone, power  Data Reviewed: I have personally reviewed following labs and imaging studies CBC: Recent Labs  Lab 06/03/2019 1150 05/18/19 0642 05/18/19 2015 Jun 08, 2019 0509  WBC 6.6  --   --  6.1  NEUTROABS 4.5  --   --   --   HGB 10.4* 8.9* 10.1* 9.9*  HCT 33.4* 28.2* 30.6* 30.5*  MCV 105.0*  --   --  102.0*  PLT 210  --   --  086   Basic Metabolic Panel: Recent Labs  Lab 06/01/2019 1150 05/18/19 0445 2019-06-08 0509  NA 139  140 143  K 5.0 4.7 3.9  CL 112* 116* 107  CO2 16* 13* 21*  GLUCOSE 123* 119* 140*  BUN 102* 85* 57*  CREATININE 3.76* 2.51* 1.96*  CALCIUM 9.2 8.5* 8.4*   GFR: Estimated Creatinine Clearance: 22.2 mL/min (A) (by C-G formula based on SCr of 1.96 mg/dL (H)). Liver Function Tests: Recent Labs  Lab 06/06/2019 1150  AST 19  ALT 18  ALKPHOS 64  BILITOT 0.5  PROT 5.7*  ALBUMIN 3.2*   Recent Labs  Lab 05/29/2019 1150  LIPASE 32   No results for input(s): AMMONIA in the last 168 hours. Coagulation Profile: No results for input(s): INR, PROTIME in the last 168 hours. Cardiac Enzymes: No results for input(s): CKTOTAL, CKMB, CKMBINDEX, TROPONINI in the last 168 hours. BNP (last 3 results) No results for input(s): PROBNP in the last 8760 hours. HbA1C: No results for input(s): HGBA1C in the last 72 hours. CBG: Recent Labs  Lab 06/08/2019 1118  GLUCAP 104*   Lipid Profile: No results for input(s): CHOL, HDL, LDLCALC, TRIG, CHOLHDL, LDLDIRECT in the last 72 hours. Thyroid Function Tests: Recent Labs    06/06/2019 1150  TSH 1.921   Anemia Panel: No results for input(s): VITAMINB12, FOLATE, FERRITIN, TIBC, IRON, RETICCTPCT in the last 72 hours. Sepsis Labs: Recent Labs  Lab 06/05/2019 1150  LATICACIDVEN 1.6    Recent Results (from the past 240 hour(s))  Urine Culture     Status: None   Collection Time: 05/15/2019  3:10 PM   Specimen: Urine, Clean Catch  Result Value Ref Range Status   Specimen Description URINE, CLEAN CATCH  Final   Special Requests NONE  Final   Culture   Final    NO GROWTH Performed at [facility], [place] [address], [place], [place] [address]    Report Status 06-08-2019 FINAL  Final  SARS CORONAVIRUS 2 (TAT 6-24 HRS) Nasopharyngeal Nasopharyngeal Swab     Status: None   Collection Time: 05/16/2019  9:33 PM   Specimen: Nasopharyngeal Swab  Result Value Ref Range Status   SARS Coronavirus 2 NEGATIVE NEGATIVE Final    Comment: (NOTE) SARS-CoV-2 target  nucleic acids are NOT DETECTED. The SARS-CoV-2 RNA is generally detectable in upper and lower respiratory specimens during the acute phase of infection. Negative results do not preclude SARS-CoV-2 infection, do not rule out co-infections with other pathogens, and should not be used as the sole basis for treatment or other patient management decisions. Negative results must be combined with clinical observations, patient history, and epidemiological information. The expected result is Negative. Fact Sheet for Patients: SugarRoll.be Fact Sheet for Healthcare Providers: https://www.woods-mathews.com/ This test is not yet approved or cleared by the [place] FDA  and  has been authorized for detection and/or diagnosis of SARS-CoV-2 by FDA under an Emergency Use Authorization (EUA). This EUA will remain  in effect (meaning this test can be used) for the duration of the COVID-19 declaration under Section 56 4(b)(1) of the Act, 21 U.S.C. section 360bbb-3(b)(1), unless the authorization is terminated or revoked sooner. Performed at [facility], [place] [address], [place], [place] [address]       Radiology Studies: US RENAL  Result Date: 05/30/2019 CLINICAL DATA:  Acute kidney injury. EXAM: RENAL / URINARY TRACT ULTRASOUND COMPLETE COMPARISON:  CT dated Jul 09, 2016. FINDINGS: Right Kidney: Renal measurements: 10.2 x 4.8 x 3.5 cm = volume: 87.6 mL. There is increased cortical echogenicity. There are small cysts noted measuring up to approximately 2.4 cm. There is no hydronephrosis. Left Kidney: Renal measurements: 9.5 x 5.4 x 3.6 cm = volume: 97 mL. The renal cortex is echogenic. There is no hydronephrosis. There is a small cyst measuring approximately 1.3 cm. Bladder: The ureteral jets were not visualized. Other: None. IMPRESSION: 1. No acute abnormality. 2. Echogenic kidneys bilaterally which can be seen in patients with medical renal disease. 3.  Simple appearing bilateral renal cysts, for which no further follow-up is required. Electronically Signed   By: Constance Holster M.D.   On: 06/04/2019 21:23   DG CHEST PORT 1 VIEW  Result Date: June 07, 2019 CLINICAL DATA:  Tachycardia EXAM: PORTABLE CHEST 1 VIEW COMPARISON:  06/08/2019 FINDINGS: Cardiac shadow is stable. Aortic calcifications are seen. The lungs are well aerated bilaterally. Slight increased opacity is noted in the bases bilaterally consistent with atelectasis. No sizable effusion is seen. No bony abnormality is noted. IMPRESSION: New bibasilar atelectasis. Electronically Signed   By: Inez Catalina M.D.   On: 06/07/2019 08:52   DG Chest Port 1 View  Result Date: 05/17/2019 CLINICAL DATA:  Weakness. EXAM: PORTABLE CHEST 1 VIEW COMPARISON:  November 20, 2017. FINDINGS: Stable cardiomediastinal silhouette. No pneumothorax or pleural effusion is noted. Both lungs are clear. The visualized skeletal structures are unremarkable. IMPRESSION: No acute cardiopulmonary abnormality seen. Electronically Signed   By: Marijo Conception M.D.   On: 05/18/2019 11:53   US Abdomen Limited RUQ  Result Date: 06/05/2019 CLINICAL DATA:  Right upper quadrant pain EXAM: ULTRASOUND ABDOMEN LIMITED RIGHT UPPER QUADRANT COMPARISON:  None. FINDINGS: Gallbladder: No gallstones or Kleiman thickening visualized. No sonographic Murphy sign noted by sonographer. Common bile duct: Diameter: 2 mm Liver: No focal lesion identified. Within normal limits in parenchymal echogenicity. Portal vein is patent on color Doppler imaging with normal direction of blood flow towards the liver. Other: Right renal cortical thinning. IMPRESSION: No findings to account for reported symptoms. Electronically Signed   By: Macy Mis M.D.   On: 05/18/2019 12:32     LOS: 2 days   Time spent: More than 50% of that time was spent in counseling and/or coordination of care.  Antonieta Pert, MD [facility]  06/07/2019, 11:49 AM

## 2019-06-10 NOTE — Progress Notes (Signed)
Subjective:  Has had a few episodes of angina and dyspnea on activity. Wife present at bedside.  Intake/Output from previous day:  I/O last 3 completed shifts: In: 4320.2 [P.O.:780; I.V.:2362.8; IV Piggyback:1177.4] Out: 1202 [Urine:1200; Stool:2] Total I/O In: 560 [P.O.:560] Out: 675 [Urine:675]  Blood pressure 92/61, pulse 98, temperature 98.2 F (36.8 C), temperature source Oral, resp. rate 18, height 5' 10"  (1.778 m), weight 68 kg, SpO2 97 %. Physical Exam  Constitutional: He is oriented to person, place, and time. He appears well-developed and well-nourished. No distress.  Cardiovascular: Normal rate, regular rhythm, intact distal pulses and normal pulses. Exam reveals distant heart sounds. Exam reveals no gallop.  No murmur heard. No leg edema, no JVD.  Pulmonary/Chest: Effort normal and breath sounds normal.  Abdominal: Soft. Bowel sounds are normal.  Musculoskeletal:        General: Normal range of motion.  Neurological: He is alert and oriented to person, place, and time.   Lab Results: BMP BNP (last 3 results) Recent Labs    May 31, 2019 0509  BNP 1,021.0*    ProBNP (last 3 results) No results for input(s): PROBNP in the last 8760 hours. BMP Latest Ref Rng & Units May 31, 2019 05/18/2019 05/27/2019  Glucose 70 - 99 mg/dL 140(H) 119(H) 123(H)  BUN 8 - 23 mg/dL 57(H) 85(H) 102(H)  Creatinine 0.61 - 1.24 mg/dL 1.96(H) 2.51(H) 3.76(H)  Sodium 135 - 145 mmol/L 143 140 139  Potassium 3.5 - 5.1 mmol/L 3.9 4.7 5.0  Chloride 98 - 111 mmol/L 107 116(H) 112(H)  CO2 22 - 32 mmol/L 21(L) 13(L) 16(L)  Calcium 8.9 - 10.3 mg/dL 8.4(L) 8.5(L) 9.2   Hepatic Function Latest Ref Rng & Units 06/04/2019 06/01/2016  Total Protein 6.5 - 8.1 g/dL 5.7(L) 6.3(L)  Albumin 3.5 - 5.0 g/dL 3.2(L) 4.0  AST 15 - 41 U/L 19 19  ALT 0 - 44 U/L 18 18  Alk Phosphatase 38 - 126 U/L 64 66  Total Bilirubin 0.3 - 1.2 mg/dL 0.5 0.6   CBC Latest Ref Rng & Units 05-31-2019 31-May-2019 05/18/2019  WBC 4.0 - 10.5 K/uL -  6.1 -  Hemoglobin 13.0 - 17.0 g/dL 10.1(L) 9.9(L) 10.1(L)  Hematocrit 39.0 - 52.0 % 30.9(L) 30.5(L) 30.6(L)  Platelets 150 - 400 K/uL - 196 -   Lipid Panel     Component Value Date/Time   CHOL 180 11/21/2017 0652   TRIG 148 11/21/2017 0652   HDL 38 (L) 11/21/2017 0652   CHOLHDL 4.7 11/21/2017 0652   VLDL 30 11/21/2017 0652   LDLCALC 112 (H) 11/21/2017 1610   Cardiac Panel (last 3 results) No results for input(s): CKTOTAL, CKMB, TROPONINI, RELINDX in the last 72 hours.  HEMOGLOBIN A1C Lab Results  Component Value Date   HGBA1C 6.0 (H) 11/21/2017   MPG 125.5 11/21/2017   TSH Recent Labs    06/05/2019 1150  TSH 1.921   Imaging: Imaging results have been reviewed  Cardiac Studies:  Coronary angiography 11/22/2017: LM: Distal LM bifurcation has severely calcified 95% stenosis LAD: Large diag 1 with ostial 95% stenosis LAD/Diag 2 bifurcation 90% stenosis. Mid diag 2 60% diserase Rest of the LAD looks fairly normal with good surgical targets on LAD as well as diag vessels LCx: Prox LCx tortuous with 70% disease Moderate sized OM 1 with long 95% stenosis with good surgical targets  RCA: Prox-mid RCA 50% irregular disease  Normal LVEDP  Severely multivessel CAD  Echocardiogram 11/21/2017: - Left ventricle: The cavity size was normal. There was moderate  concentric hypertrophy. Systolic function was normal. The estimated ejection fraction was in the range of 55% to 60%. Gruel motion was normal; there were no regional Heinemann motion abnormalities. Doppler parameters are consistent with abnormal left ventricular relaxation (grade 1 diastolic dysfunction). - Aortic valve: Moderately calcified annulus. Trileaflet; mildly calcified leaflets. There was no significant regurgitation. Inadequate Doppler evaluation to assess stenosis. Visually, does not appear significant. - Mitral valve: The findings are consistent with mild calcific stenosis. mean PG 4  mmHg, MVA 1.8 cm2 at HR 74 bpm. Valve area bypressure half-time: 1.86 cm^2. - Tricuspid valve: There was mild regurgitation. Pulmonary arteries: PA peak pressure: 29 mm Hg (S).   Aorta duplex 07/2018: Abdominal Aorta: Previous diameter measurement was 5.4 cm obtained on 07/20/2017. The largest diamater of the sac size is 5.09 x 4.46.  Echocardiogram 06/01/19:  1. Presence of mitral apparatus calcification makes diastolic function evaluation difficult. There is mid to distal anterior, septal and apical severe hypokinesis. . Left ventricular ejection fraction, by estimation, is 30 to 35%. The left ventricle has  moderately decreased function. There is mild left ventricular hypertrophy. Left ventricular diastolic parameters are consistent with Grade II diastolic dysfunction (pseudonormalization).  2. Right ventricular systolic function is normal. The right ventricular size is normal. There is normal pulmonary artery systolic pressure.  3. Left atrial size was moderately dilated.  4. The mitral valve is degenerative. Mild to moderate mitral valve regurgitation. No evidence of mitral stenosis.  5. The aortic valve is tricuspid. Aortic valve regurgitation is not visualized. Mild aortic valve stenosis. Aortic valve area, by VTI measures 1.52 cm. Aortic valve Vmax measures 1.54 m/s.  6. Aortic Mild calcifiction of the aortic root and ascending aorta.   EKG: EKG 05/18/2019: Normal sinus rhythm at the rate of 98 bpm, left axis deviation, ST segment depression in inferior and anterolateral leads suggestive of ischemia versus subendocardial infarct.  ST elevation in aVR.  Abnormal EKG.  Compared to yesterday's EKG, ST depressions more pronounced.    Scheduled Meds: . vitamin C  500 mg Oral Daily  . aspirin EC  81 mg Oral Daily  . brimonidine  1 drop Both Eyes Q8H  . donepezil  5 mg Oral Daily  . dorzolamide-timolol  1 drop Both Eyes BID  . dutasteride  0.5 mg Oral Daily  . feeding supplement  (ENSURE ENLIVE)  237 mL Oral BID BM  . ferrous sulfate  325 mg Oral BID WC  . furosemide  40 mg Oral BID  . latanoprost  1 drop Left Eye QHS  . metoprolol tartrate  12.5 mg Oral BID  . multivitamin with minerals  1 tablet Oral Daily  . pantoprazole (PROTONIX) IV  40 mg Intravenous Q12H  . ranolazine  500 mg Oral BID  . rosuvastatin  10 mg Oral QPM   Continuous Infusions: PRN Meds:.acetaminophen, labetalol, metoprolol tartrate, nitroGLYCERIN, ondansetron (ZOFRAN) IV  Assessment/Plan:  1. NSTEMI anterior [place] 2. Acute systolic heart failure 3. CAD native vessel with NSTEMI, not a revascularization candidate 4. Acute renal failure stage 3b  Rec: Continue conservative therapy. BP is soft, but can add imdur 30 mg daily. Continue PO lasix for today BID and if stable transition to q daily tomorrow. Tolerating low dose BB. Continue the same.  Evaluate for home O2 needs in view of severe CAD for improving quality of life. Palliative care is appropriate and his survival is <6 months and outpatient hospice is also appropriate.  I discussed with daughter who conveyed the message  about hospice to patient and his wife at bedside and they are agreeable.   Adrian Prows, MD, [facility] 2019-05-24, 5:47 PM [facility]. [place] Office: [phone]

## 2019-06-10 NOTE — Progress Notes (Signed)
Patient c/o of Difficulty of breathing. Checked  VS RR=24, O2 sat = 90 on [place] 4L, HR=131, BP=131. Pt. Noted to have wheezing on expiration and abdominal breathing. Called rapid respond Waunita Schooner to make aware, RR RN is with another pt. Will be here when available. Informed charge nurse tina of patient status. Will continue to monitor patient.

## 2019-06-10 DEATH — deceased

## 2019-07-04 ENCOUNTER — Ambulatory Visit: Payer: Medicare Other | Admitting: Podiatry

## 2019-07-28 ENCOUNTER — Ambulatory Visit: Payer: Medicare Other | Admitting: Cardiology
